# Patient Record
Sex: Female | Born: 1965
Health system: Southern US, Community
[De-identification: ages and names within clinical notes are randomized; demographics above are authoritative.]

## PROBLEM LIST (undated history)

## (undated) DIAGNOSIS — M1990 Unspecified osteoarthritis, unspecified site: Secondary | ICD-10-CM

## (undated) DIAGNOSIS — Z973 Presence of spectacles and contact lenses: Secondary | ICD-10-CM

## (undated) DIAGNOSIS — D649 Anemia, unspecified: Secondary | ICD-10-CM

## (undated) HISTORY — PX: CERVICAL CERCLAGE: SHX1329

---

## 2005-04-28 ENCOUNTER — Emergency Department: Payer: Self-pay | Admitting: Emergency Medicine

## 2005-04-29 ENCOUNTER — Emergency Department: Payer: Self-pay | Admitting: Emergency Medicine

## 2007-05-13 DIAGNOSIS — N912 Amenorrhea, unspecified: Secondary | ICD-10-CM | POA: Insufficient documentation

## 2008-09-11 DIAGNOSIS — Z833 Family history of diabetes mellitus: Secondary | ICD-10-CM | POA: Insufficient documentation

## 2014-11-02 DIAGNOSIS — Z791 Long term (current) use of non-steroidal anti-inflammatories (NSAID): Secondary | ICD-10-CM | POA: Insufficient documentation

## 2014-11-02 DIAGNOSIS — R7989 Other specified abnormal findings of blood chemistry: Secondary | ICD-10-CM | POA: Insufficient documentation

## 2014-11-02 DIAGNOSIS — L659 Nonscarring hair loss, unspecified: Secondary | ICD-10-CM | POA: Insufficient documentation

## 2015-01-14 DIAGNOSIS — M2550 Pain in unspecified joint: Secondary | ICD-10-CM | POA: Insufficient documentation

## 2015-04-09 DIAGNOSIS — N939 Abnormal uterine and vaginal bleeding, unspecified: Secondary | ICD-10-CM | POA: Insufficient documentation

## 2015-05-10 DIAGNOSIS — M7989 Other specified soft tissue disorders: Secondary | ICD-10-CM | POA: Insufficient documentation

## 2015-10-01 ENCOUNTER — Encounter: Payer: Self-pay | Admitting: Emergency Medicine

## 2015-10-01 ENCOUNTER — Ambulatory Visit
Admission: EM | Admit: 2015-10-01 | Discharge: 2015-10-01 | Disposition: A | Discharge status: Home / Self Care | Payer: 59 | Attending: Family Medicine | Admitting: Family Medicine

## 2015-10-01 ENCOUNTER — Ambulatory Visit (INDEPENDENT_AMBULATORY_CARE_PROVIDER_SITE_OTHER): Payer: 59

## 2015-10-01 DIAGNOSIS — R071 Chest pain on breathing: Secondary | ICD-10-CM | POA: Insufficient documentation

## 2015-10-01 DIAGNOSIS — Z79899 Other long term (current) drug therapy: Secondary | ICD-10-CM | POA: Diagnosis not present

## 2015-10-01 DIAGNOSIS — R079 Chest pain, unspecified: Secondary | ICD-10-CM | POA: Diagnosis not present

## 2015-10-01 DIAGNOSIS — R0789 Other chest pain: Secondary | ICD-10-CM

## 2015-10-01 LAB — BASIC METABOLIC PANEL
ANION GAP: 5 (ref 5–15)
BUN: 13 mg/dL (ref 6–20)
CALCIUM: 9.2 mg/dL (ref 8.9–10.3)
CO2: 26 mmol/L (ref 22–32)
Chloride: 107 mmol/L (ref 101–111)
Creatinine, Ser: 0.85 mg/dL (ref 0.44–1.00)
GLUCOSE: 89 mg/dL (ref 65–99)
POTASSIUM: 3.8 mmol/L (ref 3.5–5.1)
Sodium: 138 mmol/L (ref 135–145)

## 2015-10-01 LAB — CK: CK TOTAL: 213 U/L (ref 38–234)

## 2015-10-01 LAB — CBC WITH DIFFERENTIAL/PLATELET
BASOS ABS: 0.1 10*3/uL (ref 0–0.1)
BASOS PCT: 1 %
Eosinophils Absolute: 0.1 10*3/uL (ref 0–0.7)
Eosinophils Relative: 2 %
HEMATOCRIT: 39.8 % (ref 35.0–47.0)
Hemoglobin: 12.9 g/dL (ref 12.0–16.0)
LYMPHS PCT: 35 %
Lymphs Abs: 2.6 10*3/uL (ref 1.0–3.6)
MCH: 28.8 pg (ref 26.0–34.0)
MCHC: 32.5 g/dL (ref 32.0–36.0)
MCV: 88.5 fL (ref 80.0–100.0)
MONO ABS: 0.6 10*3/uL (ref 0.2–0.9)
Monocytes Relative: 8 %
NEUTROS ABS: 4.1 10*3/uL (ref 1.4–6.5)
Neutrophils Relative %: 54 %
PLATELETS: 190 10*3/uL (ref 150–440)
RBC: 4.5 MIL/uL (ref 3.80–5.20)
RDW: 13 % (ref 11.5–14.5)
WBC: 7.5 10*3/uL (ref 3.6–11.0)

## 2015-10-01 LAB — CKMB (ARMC ONLY): CK, MB: 2.5 ng/mL (ref 0.5–5.0)

## 2015-10-01 LAB — FIBRIN DERIVATIVES D-DIMER (ARMC ONLY): FIBRIN DERIVATIVES D-DIMER (ARMC): 276 (ref 0–499)

## 2015-10-01 LAB — TROPONIN I: Troponin I: <0.03 ng/mL (ref <0.03)

## 2015-10-01 MED ORDER — KETOROLAC TROMETHAMINE 60 MG/2ML IM SOLN
60.0000 mg | Freq: Once | INTRAMUSCULAR | Status: AC
  Administered 2015-10-01: 60 mg via INTRAMUSCULAR

## 2015-10-01 MED ORDER — MELOXICAM 15 MG PO TABS: 15.0000 mg | ORAL_TABLET | Freq: Every day | ORAL | 0 refills | Status: DC

## 2015-10-01 NOTE — ED Triage Notes (Signed)
Patient states she developed chest pain this morning about 8. Pain is radiating from center of chest to left shoulder and back

## 2015-10-01 NOTE — ED Provider Notes (Signed)
[facility]    CSN: [id number] Arrival date & time: 10/01/15  1232  First Provider Contact:  First MD Initiated Contact with Patient 10/01/15 1246        History   Chief Complaint Chief Complaint  Patient presents with  . Chest Pain    HPI Jenna [name] is a 50 y.o. female.   Patient is here because of chest pain. She states she had no pain when she woke up and got up to go to work and as she was leaving about 7:45 she started having pain which took a deep breath. The pain was centered in the left upper chest. She was at work the pain continues and cause enough discomfort that she left. 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 She was a little bit concerned about going to the ED there since that she puts it and we'll see her that she came up to 11 to be seen and evaluated.  Past history essentially negative other than cervical cerclage during her pregnancies. She does not smoke she is allergic to sulfur. No significant family medical history pertinent to today's visit. She denies any history of hyperlipidemia or hypertension   The history is provided by the patient. No language interpreter was used.  Chest Pain  Pain location:  Substernal area and L chest Pain radiates to:  Upper back Pain severity:  Moderate Onset quality:  Sudden Duration:  6 hours Timing:  Constant Progression:  Unchanged Chronicity:  New Context: breathing   Relieved by:  Nothing Worsened by:  Nothing Ineffective treatments:  None tried Associated symptoms: shortness of breath   Risk factors: no birth control, no coronary artery disease, no diabetes mellitus, no high cholesterol, no hypertension, not female, not obese, not pregnant, no prior DVT/PE and no smoking     History reviewed. No pertinent past medical history.  There are no active problems to display for this patient.   Past Surgical History:  Procedure Laterality Date  . CERVICAL CERCLAGE      OB History    No data  available       Home Medications    Prior to Admission medications   Medication Sig Start Date End Date Taking? Authorizing Provider  meloxicam (MOBIC) 15 MG tablet Take 1 tablet (15 mg total) by mouth daily. 10/01/15   Frederich Cha, MD    Family History Family History  Problem Relation Age of Onset  . Diabetes Other     Social History Social History  Substance Use Topics  . Smoking status: Never Smoker  . Smokeless tobacco: Never Used  . Alcohol use Yes     Allergies   Sulfa antibiotics   Review of Systems Review of Systems  Respiratory: Positive for shortness of breath.   Cardiovascular: Positive for chest pain.     Physical Exam Triage Vital Signs ED Triage Vitals  Enc Vitals Group     BP 10/01/15 1240 (!) 163/83     Pulse Rate 10/01/15 1240 75     Resp 10/01/15 1240 20     Temp 10/01/15 1240 98.1 F (36.7 C)     Temp Source 10/01/15 1240 Oral     SpO2 10/01/15 1240 100 %     Weight 10/01/15 1240 175 lb (79.4 kg)     Height 10/01/15 1240 5\' 8"  (1.727 m)     Head Circumference --      Peak Flow --      Pain Score 10/01/15  1245 7     Pain Loc --      Pain Edu? --      Excl. in [place]? --    No data found.   Updated Vital Signs BP (!) 143/82 (BP Location: Right Arm)   Pulse 72   Temp 97.5 F (36.4 C) (Tympanic)   Resp 16   Ht 5\' 8"  (1.727 m)   Wt 175 lb (79.4 kg)   LMP  (LMP Unknown) Comment: IUD, denies preg  SpO2 100%   BMI 26.61 kg/m   Visual Acuity Right Eye Distance:   Left Eye Distance:   Bilateral Distance:    Right Eye Near:   Left Eye Near:    Bilateral Near:     Physical Exam  Constitutional: She is oriented to person, place, and time. She appears well-developed and well-nourished. No distress.  HENT:  Head: Normocephalic and atraumatic.  Eyes: EOM are normal. Pupils are equal, round, and reactive to light.  Neck: Normal range of motion. Neck supple.  Cardiovascular: Normal rate, regular rhythm and normal heart sounds.     Pulmonary/Chest: Effort normal and breath sounds normal. She exhibits tenderness. She exhibits no laceration and no swelling. Right breast exhibits no tenderness. Left breast exhibits no tenderness.    Abdominal: Soft.  Musculoskeletal: Normal range of motion.  Neurological: She is alert and oriented to person, place, and time.  Skin: Skin is warm.  Psychiatric: She has a normal mood and affect.  Vitals reviewed.    UC Treatments / Results  Labs (all labs ordered are listed, but only abnormal results are displayed) Labs Reviewed  CBC WITH DIFFERENTIAL/PLATELET  BASIC METABOLIC PANEL  TROPONIN I  CK  FIBRIN DERIVATIVES D-DIMER ([facility] ONLY)  CKMB([facility] ONLY)    EKG  EKG Interpretation None      ED ECG REPORT I, Savan Ruta H, the attending physician, personally viewed and interpreted this ECG.   Date: 10/01/2015  EKG Time: 14:21:33  Rate: 73  Rhythm: normal EKG, normal sinus rhythm  Axis: 44  Intervals:none  ST&T Change: none Radiology Dg Chest 1 View  Result Date: 10/01/2015 CLINICAL DATA:  Chest pain today with episodes of shortness of breath EXAM: CHEST 1 VIEW COMPARISON:  None. FINDINGS: No active infiltrate or effusion is seen. Mediastinal and hilar contours are unremarkable. The heart is within normal limits in size. Calcification near the expected insertion of the right rotator cuff may indicate chronic calcific right rotator cuff tendinitis. IMPRESSION: 1. No active lung disease. 2. Question chronic calcific right rotator cuff tendinitis. Electronically Signed   By: Ivar Drape M.D.   On: 10/01/2015 13:58    Procedures Procedures (including critical care time)  Medications Ordered in UC Medications  ketorolac (TORADOL) injection 60 mg (60 mg Intramuscular Given 10/01/15 1324)   Results for orders placed or performed during the hospital encounter of 10/01/15  CBC with Differential  Result Value Ref Range   WBC 7.5 3.6 - 11.0 K/uL   RBC 4.50 3.80 - 5.20  MIL/uL   Hemoglobin 12.9 12.0 - 16.0 g/dL   HCT 39.8 35.0 - 47.0 %   MCV 88.5 80.0 - 100.0 fL   MCH 28.8 26.0 - 34.0 pg   MCHC 32.5 32.0 - 36.0 g/dL   RDW 13.0 11.5 - 14.5 %   Platelets 190 150 - 440 K/uL   Neutrophils Relative % 54 %   Neutro Abs 4.1 1.4 - 6.5 K/uL   Lymphocytes Relative 35 %   Lymphs  Abs 2.6 1.0 - 3.6 K/uL   Monocytes Relative 8 %   Monocytes Absolute 0.6 0.2 - 0.9 K/uL   Eosinophils Relative 2 %   Eosinophils Absolute 0.1 0 - 0.7 K/uL   Basophils Relative 1 %   Basophils Absolute 0.1 0 - 0.1 K/uL  Basic metabolic panel  Result Value Ref Range   Sodium 138 135 - 145 mmol/L   Potassium 3.8 3.5 - 5.1 mmol/L   Chloride 107 101 - 111 mmol/L   CO2 26 22 - 32 mmol/L   Glucose, Bld 89 65 - 99 mg/dL   BUN 13 6 - 20 mg/dL   Creatinine, Ser 0.85 0.44 - 1.00 mg/dL   Calcium 9.2 8.9 - 10.3 mg/dL   GFR calc non Af Amer >60 >60 mL/min   GFR calc Af Amer >60 >60 mL/min   Anion gap 5 5 - 15  Troponin I  Result Value Ref Range   Troponin I <0.03 <0.03 ng/mL  CK  Result Value Ref Range   Total CK 213 38 - 234 U/L  Fibrin derivatives D-Dimer ([facility] only)  Result Value Ref Range   Fibrin derivatives D-dimer ([facility]) 276 0 - 499  CKMB([facility] only)  Result Value Ref Range   CK, MB 2.5 0.5 - 5.0 ng/mL    Initial Impression / Assessment and Plan / UC Course  I have reviewed the triage vital signs and the nursing notes.  Pertinent labs & imaging results that were available during my care of the patient were reviewed by me and considered in my medical decision making (see chart for details).  Clinical Course  Patient improved with the injection of Toradol 60 no grams IM EKG was normal chest x-ray is normal labs drawn normal and d-dimer is negative. Will allow patient to go home work note for today and tomorrow placed on Mobic 15 mg 1 tablet day follow up with PCP if not better in the next 2448 hrs. Final Clinical Impressions(s) / UC Diagnoses   Final diagnoses:  Chest wall  pain  Chest pain on breathing    New Prescriptions New Prescriptions   MELOXICAM (MOBIC) 15 MG TABLET    Take 1 tablet (15 mg total) by mouth daily.     Frederich Cha, MD 10/01/15 [phone]

## 2015-10-29 DIAGNOSIS — E079 Disorder of thyroid, unspecified: Secondary | ICD-10-CM | POA: Diagnosis not present

## 2015-10-29 DIAGNOSIS — Z8719 Personal history of other diseases of the digestive system: Secondary | ICD-10-CM | POA: Insufficient documentation

## 2015-10-29 DIAGNOSIS — Z6829 Body mass index (BMI) 29.0-29.9, adult: Secondary | ICD-10-CM | POA: Diagnosis not present

## 2015-10-29 DIAGNOSIS — Z113 Encounter for screening for infections with a predominantly sexual mode of transmission: Secondary | ICD-10-CM | POA: Diagnosis not present

## 2015-10-29 DIAGNOSIS — Z0000 Encounter for general adult medical examination without abnormal findings: Secondary | ICD-10-CM | POA: Diagnosis not present

## 2015-11-22 DIAGNOSIS — Z0000 Encounter for general adult medical examination without abnormal findings: Secondary | ICD-10-CM | POA: Diagnosis not present

## 2015-11-22 DIAGNOSIS — Z113 Encounter for screening for infections with a predominantly sexual mode of transmission: Secondary | ICD-10-CM | POA: Diagnosis not present

## 2015-11-22 DIAGNOSIS — Z8719 Personal history of other diseases of the digestive system: Secondary | ICD-10-CM | POA: Diagnosis not present

## 2015-11-22 DIAGNOSIS — E079 Disorder of thyroid, unspecified: Secondary | ICD-10-CM | POA: Diagnosis not present

## 2015-12-17 DIAGNOSIS — B009 Herpesviral infection, unspecified: Secondary | ICD-10-CM | POA: Diagnosis not present

## 2015-12-17 DIAGNOSIS — Z23 Encounter for immunization: Secondary | ICD-10-CM | POA: Diagnosis not present

## 2015-12-17 DIAGNOSIS — Z6828 Body mass index (BMI) 28.0-28.9, adult: Secondary | ICD-10-CM | POA: Diagnosis not present

## 2015-12-20 DIAGNOSIS — Z1231 Encounter for screening mammogram for malignant neoplasm of breast: Secondary | ICD-10-CM | POA: Diagnosis not present

## 2016-03-09 DIAGNOSIS — Z6829 Body mass index (BMI) 29.0-29.9, adult: Secondary | ICD-10-CM | POA: Diagnosis not present

## 2016-03-09 DIAGNOSIS — M62830 Muscle spasm of back: Secondary | ICD-10-CM | POA: Diagnosis not present

## 2016-03-09 DIAGNOSIS — G8929 Other chronic pain: Secondary | ICD-10-CM | POA: Diagnosis not present

## 2016-03-09 DIAGNOSIS — M25511 Pain in right shoulder: Secondary | ICD-10-CM | POA: Diagnosis not present

## 2016-03-17 ENCOUNTER — Encounter: Payer: Self-pay | Admitting: Physical Therapy

## 2016-03-17 ENCOUNTER — Ambulatory Visit: Payer: 59 | Attending: Family Medicine | Admitting: Physical Therapy

## 2016-03-17 DIAGNOSIS — M25511 Pain in right shoulder: Secondary | ICD-10-CM | POA: Diagnosis not present

## 2016-03-17 DIAGNOSIS — R293 Abnormal posture: Secondary | ICD-10-CM | POA: Diagnosis not present

## 2016-03-17 DIAGNOSIS — M6281 Muscle weakness (generalized): Secondary | ICD-10-CM | POA: Insufficient documentation

## 2016-03-17 DIAGNOSIS — G8929 Other chronic pain: Secondary | ICD-10-CM | POA: Diagnosis not present

## 2016-03-17 NOTE — Therapy (Signed)
[place] MAIN [facility] SERVICES [address] [place], [place], [address] Phone: [phone]   Fax:  [phone]  Physical Therapy Evaluation  Patient Details  Name: Jenna [name] MRN: [id number] Date of Birth: 1966/01/29 Referring Provider: Blenda Nicely MD  Encounter Date: 03/17/2016      PT End of Session - 03/17/16 0852    Visit Number 1   Number of Visits 13   Date for PT Re-Evaluation 04/28/16   PT Start Time 0812   PT Stop Time 0855   PT Time Calculation (min) 43 min   Activity Tolerance Patient limited by pain   Behavior During Therapy [facility] for tasks assessed/performed      History reviewed. No pertinent past medical history.  Past Surgical History:  Procedure Laterality Date  . CERVICAL CERCLAGE      There were no vitals filed for this visit.       Subjective Assessment - 03/17/16 0814    Subjective 51 yo Female reports increased right shoulder pain since August 2017. She reports that she was working out with weights and the next day started feeling pain; She reports feeling pain along right side of chest which resolved shortly after. She is having continued right shoulder pain which is worse when sleeping; She reports sleeping on left shoulder and will wake up with increased right shoulder pain; Patient went to see MD who prescribed prednisone dose pack; She reports feeling increased posterior right shoulder pain along scapula that radiates to top of right shoulder; She reports increased numbness/tingling with sleeping; She reports also having numbness on left shoulder from prolonged sleeping on left side;    Pertinent History personal factors affecting rehab: chronic condition; impaired posture, continued work and caring for kids;    How long can you sit comfortably? 20-30 min;    How long can you stand comfortably? NA   How long can you walk comfortably? NA   Diagnostic tests none recent;    Patient Stated Goals relieve  pain so that she can get back exercising;    Currently in Pain? Yes   Pain Score 5    Pain Location Shoulder   Pain Orientation Right   Pain Descriptors / Indicators Aching;Dull;Burning   Pain Type Chronic pain   Pain Onset More than a month ago   Pain Frequency Constant   Aggravating Factors  lying down;    Pain Relieving Factors heat does help;    Effect of Pain on Daily Activities decreased; still working full-time/full-duty;             [facility] PT Assessment - 03/17/16 0001      Assessment   Medical Diagnosis Right shoulder pain   Referring Provider Blenda Nicely MD   Onset Date/Surgical Date 10/01/15   Hand Dominance Right   Next MD Visit 2 months   Prior Therapy denies any PT for this condition;      Precautions   Precautions None     Restrictions   Weight Bearing Restrictions No     Balance Screen   Has the patient fallen in the past 6 months No   Has the patient had a decrease in activity level because of a fear of falling?  No   Is the patient reluctant to leave their home because of a fear of falling?  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;    Leisure ride motorcycle; working out;      Charity fundraiser Status Within Functional Limits for tasks assessed     Observation/Other Assessments   Observations pt sits with slightly slumped posture; will drop right shoulder with increased pain; patient very pain focused and reports pain along right scapulae radiating to right side of neck and shoulder;    Quick DASH  59% (the higher the score the greater the disability)     Sensation   Light Touch Appears Intact    Proprioception Appears Intact     Coordination   Gross Motor Movements are Fluid and Coordinated Yes   Fine Motor Movements are Fluid and Coordinated Yes     Posture/Postural Control   Posture Comments sits with mild slumped posture with mild to moderate forward rounded shoulders; demontrates increased slumped posture and forward lean with increased pian;       AROM   Overall AROM Comments BLE is WFL   Right Shoulder Extension 60 Degrees   Right Shoulder Flexion 70 Degrees   Right Shoulder ABduction 60 Degrees   Right Shoulder Internal Rotation 60 Degrees   Right Shoulder External Rotation 0 Degrees   Left Shoulder Extension 60 Degrees   Left Shoulder Flexion 160 Degrees   Left Shoulder ABduction 80 Degrees  has pain along top of shoulder;   Left Shoulder Internal Rotation 60 Degrees   Left Shoulder External Rotation 60 Degrees     Strength   Overall Strength Comments RUE shoulder grossly 3-/5, LUE shoulder: 4-/5, BUE: elbow/wrist and hand 4/5; right rhomboids: 3-/5     Palpation   Palpation comment has moderate tenderness along right scapulae; increased tightness noted along right levator scapular muscle;      Neer Impingement test    Findings Positive   Side Right     Hawkins-Kennedy test   Findings Positive   Side Right     Empty Can test   Findings Positive   Side Right     Full Can test   Findings Positive     Drop Arm test   Findings Negative   Side Right     Transfers   Comments able to transfer sit<>stand independently;      Ambulation/Gait   Gait Comments ambulates with normal gait pattern; only postural abnormality noted; see above;          TREATMENT: Educated patient in posterior shoulder rolls and scapular retraction to improve postural support and flexibility; Will advance HEP next visit; (3 min)                   PT Education - 03/17/16 0851    Education provided Yes   Education Details posture, recommendations, plan of  care;    Person(s) Educated Patient   Methods Explanation;Verbal cues   Comprehension Verbalized understanding;Returned demonstration;Verbal cues required             PT Long Term Goals - 03/17/16 0908      PT LONG TERM GOAL #1   Title Patient will be independent in home exercise program to improve strength/mobility for better functional independence with ADLs.   Time 6   Period Weeks   Status New     PT  LONG TERM GOAL #2   Title Patient will report a worst pain of 3/10 on VAS in  right shoulder           to improve tolerance with ADLs and reduced symptoms with activities.    Time 6   Period Weeks   Status New     PT LONG TERM GOAL #3   Title Patient will improve shoulder AROM to > 140 degrees of flexion, scaption, and abduction for improved ability to perform overhead activities   Time 6   Period Weeks   Status New     PT LONG TERM GOAL #4   Title Patient will decrease Quick DASH score by > 8 points demonstrating reduced self-reported upper extremity disability.   Time 6   Period Weeks   Status New               Plan - 03/17/16 0901    Clinical Impression Statement 51 yo Female reports increased right shoulder pain since August 2017 after working out. She reports since then she has been resting her shoulder and trying to compensate with LUE movement. Patient reports sleeping more on left side and since she has done that she has started feeling increased left shoulder pain/numbness at night. Patient reports frequent sleep disturbances. Patient exhibits decreased UE AROM in BUE feeling increased pain along top of shoulder. Patient also reports increased discomfort on right scapulae particularly along medial and superior border. Patient demonstrates weakness in RUE. She tested positive for impingement with positive hawkins-kennedy and Neers test. Patient exhibits minimal movement in RUE glenohumeral joint with joint mobs. She would benefit from skilled PT intervention to  improve shoulder movement/flexibility and reduce pain with ADLs.     Rehab Potential Fair   Clinical Impairments Affecting Rehab Potential positive: young in age, motivated; negative: chronic condition, impaired posture; Patient's clinical presentation is evolving as she has started experiencing worsening of symptoms with numbness down RUE and progressing to pain and numbness down LUE shoulder;    PT Frequency 2x / week   PT Duration 6 weeks   PT Treatment/Interventions ADLs/Self Care Home Management;Cryotherapy;Electrical Stimulation;Iontophoresis 4mg /ml Dexamethasone;Moist Heat;Ultrasound;Therapeutic exercise;Therapeutic activities;Patient/family education;Manual techniques;Taping;Energy conservation;Dry needling;Passive range of motion   PT Next Visit Plan initiate HEP   PT Home Exercise Plan will address next visit; did instruct in posterior shoulder rolls and scapular retraction;    Consulted and Agree with Plan of Care Patient      Patient will benefit from skilled therapeutic intervention in order to improve the following deficits and impairments:  Hypomobility, Pain, Impaired UE functional use, Decreased strength, Improper body mechanics, Postural dysfunction, Impaired flexibility  Visit Diagnosis: Chronic right shoulder pain - Plan: PT plan of care cert/re-cert  Muscle weakness (generalized) - Plan: PT plan of care cert/re-cert  Abnormal posture - Plan: PT plan of care cert/re-cert     Problem List There are no active problems to display for this patient.   Heba Ige PT, DPT 03/17/2016, 9:12 AM  [place] MAIN [facility] SERVICES [address] [place], [place], [address] Phone: [phone]   Fax:  [phone]  Name: Jenna [name] MRN: [id number] Date of Birth: [birth year]

## 2016-03-19 ENCOUNTER — Encounter: Payer: 59 | Admitting: Physical Therapy

## 2016-03-19 ENCOUNTER — Ambulatory Visit: Payer: 59 | Admitting: Physical Therapy

## 2016-03-24 ENCOUNTER — Ambulatory Visit: Payer: 59 | Admitting: Physical Therapy

## 2016-03-24 ENCOUNTER — Encounter: Payer: Self-pay | Admitting: Physical Therapy

## 2016-03-24 DIAGNOSIS — M6281 Muscle weakness (generalized): Secondary | ICD-10-CM | POA: Diagnosis not present

## 2016-03-24 DIAGNOSIS — R293 Abnormal posture: Secondary | ICD-10-CM | POA: Diagnosis not present

## 2016-03-24 DIAGNOSIS — G8929 Other chronic pain: Secondary | ICD-10-CM

## 2016-03-24 DIAGNOSIS — M25511 Pain in right shoulder: Principal | ICD-10-CM

## 2016-03-24 NOTE — Therapy (Signed)
[place] MAIN [facility] SERVICES [address] [place], [place], [address] Phone: [phone]   Fax:  [phone]  Physical Therapy Treatment  Patient Details  Name: Jenna [name] MRN: [id number] Date of Birth: Aug 12, 1965 Referring Provider: Blenda Nicely MD  Encounter Date: 03/24/2016      PT End of Session - 03/24/16 0810    Visit Number 2   Number of Visits 13   Date for PT Re-Evaluation 04/28/16   PT Start Time 0805   PT Stop Time 0845   PT Time Calculation (min) 40 min   Activity Tolerance Patient limited by pain   Behavior During Therapy [facility] for tasks assessed/performed      History reviewed. No pertinent past medical history.  Past Surgical History:  Procedure Laterality Date  . CERVICAL CERCLAGE      There were no vitals filed for this visit.      Subjective Assessment - 03/24/16 0808    Subjective patient reports feeling increased pain since initial eval through last week; She reports wearing pain patch and had a massage last Friday which helped; Patient reports using her muscle relaxors at night and ibuprofen; She reports that her pain was better Sunday; Patient reports that she did try some of the exercises, but wasn't consistent because of pain;    Pertinent History personal factors affecting rehab: chronic condition; impaired posture, continued work and caring for kids;    How long can you sit comfortably? 20-30 min;    How long can you stand comfortably? NA   How long can you walk comfortably? NA   Diagnostic tests none recent;    Patient Stated Goals relieve pain so that she can get back exercising;    Currently in Pain? Yes   Pain Score 4    Pain Location Shoulder   Pain Orientation Right   Pain Descriptors / Indicators Aching;Dull   Pain Type Chronic pain   Pain Onset More than a month ago            [facility] PT Assessment - 03/24/16 0001      AROM   Right Shoulder Flexion 105 Degrees   Right Shoulder  ABduction 72 Degrees   Left Shoulder Flexion 130 Degrees   Left Shoulder ABduction 75 Degrees    TREATMENT: Warm up on UBE BUE level 1 forward/backward x4 min (unbilled);   Instructed patient in UE stretches to reduce tightness: Doorway stretch 20 sec hold x2; Posterior shoulder rolls x10 Chin tuck 5 sec hold x10;  Levator scapulae stretch 10 sec hold x2 bilaterally;  Patient required min-moderate verbal/tactile cues for correct exercise technique including cues for positioning to improve stretch and reduce pain;    Scapular retraction 5 sec hold x10 with min VCs to avoid shoulder elevation; Standing with hand on table, pressing down on table and walking backwards into shoulder flexion to facilitate inferior glide of glenohumeral joint x10 bilaterally;  PT advanced HEP- see patient instructions;  Patient in supine: PT performed grade I-II inferior glenohumeral joint mobs 10 sec bouts x4 sets bilaterally; PROM of UE shoulder in all directions x4 each, bilaterally; Gentle glenohumeral joint distraction x2-3 min bilaterally; Patient had increased difficulty relaxing on RUE with joint mobs and PROM; She reports slight increase in discomfort;  Patient sidelying: Educated patient in sidelying posture including to use extra pillows to support UE positioning to reduce forward rounded shoulder of RUE;  RUE shoulder ER AROM x10 reps;  See above ROM; Patient  demonstrates improve RUE shoulder ROM but is still limited with pain;                       PT Education - 03/24/16 0810    Education provided Yes   Education Details posture, strengthening/ROM;HEP   Person(s) Educated Patient   Methods Explanation;Verbal cues   Comprehension Verbalized understanding;Returned demonstration;Verbal cues required             PT Long Term Goals - 03/17/16 0908      PT LONG TERM GOAL #1   Title Patient will be independent in home exercise program to improve strength/mobility  for better functional independence with ADLs.   Time 6   Period Weeks   Status New     PT LONG TERM GOAL #2   Title Patient will report a worst pain of 3/10 on VAS in  right shoulder           to improve tolerance with ADLs and reduced symptoms with activities.    Time 6   Period Weeks   Status New     PT LONG TERM GOAL #3   Title Patient will improve shoulder AROM to > 140 degrees of flexion, scaption, and abduction for improved ability to perform overhead activities   Time 6   Period Weeks   Status New     PT LONG TERM GOAL #4   Title Patient will decrease Quick DASH score by > 8 points demonstrating reduced self-reported upper extremity disability.   Time 6   Period Weeks   Status New               Plan - 03/24/16 0850    Clinical Impression Statement Patient continues to have significant pain in BUE shoulders with decreased ROM and limited tolerance with exercise/stretches; Advanced HEP with instruction in scapular retraction as well as cervical stretches; patient required mod VCs to improve posture and positioning for optimum stretch/strengthening; She would benefit from additional skilled PT intervention to improve UE ROM/strength and reduce pain with ADLs;    Rehab Potential Fair   Clinical Impairments Affecting Rehab Potential positive: young in age, motivated; negative: chronic condition, impaired posture; Patient's clinical presentation is evolving as she has started experiencing worsening of symptoms with numbness down RUE and progressing to pain and numbness down LUE shoulder;    PT Frequency 2x / week   PT Duration 6 weeks   PT Treatment/Interventions ADLs/Self Care Home Management;Cryotherapy;Electrical Stimulation;Iontophoresis 4mg /ml Dexamethasone;Moist Heat;Ultrasound;Therapeutic exercise;Therapeutic activities;Patient/family education;Manual techniques;Taping;Energy conservation;Dry needling;Passive range of motion   PT Next Visit Plan initiate HEP   PT Home  Exercise Plan will address next visit; did instruct in posterior shoulder rolls and scapular retraction;    Consulted and Agree with Plan of Care Patient      Patient will benefit from skilled therapeutic intervention in order to improve the following deficits and impairments:  Hypomobility, Pain, Impaired UE functional use, Decreased strength, Improper body mechanics, Postural dysfunction, Impaired flexibility  Visit Diagnosis: Chronic right shoulder pain  Muscle weakness (generalized)  Abnormal posture     Problem List There are no active problems to display for this patient.   Damaree Sargent PT, DPT 03/24/2016, 8:54 AM  [place] MAIN [facility] SERVICES [address] [place], [place], [address] Phone: [phone]   Fax:  [phone]  Name: Jenna [name] MRN: [id number] Date of Birth: 1965-04-04

## 2016-03-24 NOTE — Patient Instructions (Addendum)
CHEST: Doorway, Bilateral - Standing    Standing in doorway, place hands on wall with elbows bent at [place], step forward and  Lean forward. Hold _20__ seconds. __2_ reps per set,2 ___ sets per day, __5_ days per week  Copyright  VHI. All rights reserved.  Axial Extension (Chin Tuck)    Gently pull chin in while lengthening back of neck. Hold _5___ seconds while counting out loud. Repeat _10___ times. Do _2-3___ sessions per day.  http://gt2.exer.us/646   Copyright  VHI. All rights reserved.  Levator Scapula Stretch, Sitting    Sit, one hand tucked under hip on side to be stretched, other hand over top of head. Turn head toward other side and look down (like looking under arm pit). Use hand on head to gently stretch neck in that position. Hold 10___ seconds. Repeat __2-3_ times per session. Do _2-3__ sessions per day.  Copyright  VHI. All rights reserved.   Flexion (Passive)   Standing, push hand down into table, and step backwards stretching shoulder as far as is comfortable;  Hold _5___ seconds. Repeat _10___ times. Do ___2_ sessions per day.  Copyright  VHI. All rights reserved.  Scapular Retraction (Standing)    With arms at sides, pinch shoulder blades together. Repeat __10__ times per set. Do __2__ sets per session. Do _2___ sessions per day.  http://orth.exer.us/945   Copyright  VHI. All rights reserved.  External Rotation (Eccentric), Active - Side-Lying    Lie on side, affected arm on top, elbow bent to 90, towel under elbow. Quickly lift forearm of affected arm. Slowly lower affected arm for 3-5 seconds. _5__ reps per set, __2_ sets per day, _5__ days per week.   http://ecce.exer.us/189   Copyright  VHI. All rights reserved.

## 2016-03-26 ENCOUNTER — Encounter: Payer: Self-pay | Admitting: Physical Therapy

## 2016-03-26 ENCOUNTER — Ambulatory Visit: Payer: 59 | Admitting: Physical Therapy

## 2016-03-26 DIAGNOSIS — R293 Abnormal posture: Secondary | ICD-10-CM | POA: Diagnosis not present

## 2016-03-26 DIAGNOSIS — M6281 Muscle weakness (generalized): Secondary | ICD-10-CM

## 2016-03-26 DIAGNOSIS — M25511 Pain in right shoulder: Secondary | ICD-10-CM | POA: Diagnosis not present

## 2016-03-26 DIAGNOSIS — G8929 Other chronic pain: Secondary | ICD-10-CM

## 2016-03-26 NOTE — Patient Instructions (Addendum)
Strengthening: Resisted External Rotation    Hold tubing in right hand, elbow at side and forearm across body. Rotate forearm out. Keep elbow at side;  Repeat 10____ times per set. Do __1__ sets per session. Do _2-3___ sessions per day.  http://orth.exer.us/829   Copyright  VHI. All rights reserved.

## 2016-03-26 NOTE — Therapy (Signed)
[place] MAIN [facility] SERVICES [address] [place], [place], [address] Phone: [phone]   Fax:  [phone]  Physical Therapy Treatment  Patient Details  Name: Jenna [name] MRN: [id number] Date of Birth: Sep 29, 1965 Referring Provider: Blenda Nicely MD  Encounter Date: 03/26/2016      PT End of Session - 03/26/16 1615    Visit Number 3   Number of Visits 13   Date for PT Re-Evaluation 04/28/16   PT Start Time 1602   PT Stop Time 1645   PT Time Calculation (min) 43 min   Activity Tolerance Patient limited by pain   Behavior During Therapy [facility] for tasks assessed/performed      History reviewed. No pertinent past medical history.  Past Surgical History:  Procedure Laterality Date  . CERVICAL CERCLAGE      There were no vitals filed for this visit.      Subjective Assessment - 03/26/16 1613    Subjective Patient reports feeling better yesterday and has noticed that she has been able to do more exercise; She does report increased pain in the right shoulder today and discomfort along neck; Patient reports compliance with HEP; she reports continued difficulty with sleep disturbances;    Pertinent History personal factors affecting rehab: chronic condition; impaired posture, continued work and caring for kids;    How long can you sit comfortably? 20-30 min;    How long can you stand comfortably? NA   How long can you walk comfortably? NA   Diagnostic tests none recent;    Patient Stated Goals relieve pain so that she can get back exercising;    Currently in Pain? Yes   Pain Score 3    Pain Location Shoulder   Pain Orientation Right   Pain Descriptors / Indicators Aching;Sore   Pain Type Chronic pain   Pain Onset More than a month ago        TREATMENT: Patient prone: PT applied moist heat to upper back x5 min (Unbilled);  PT performed grade I-II PA mobs T6, T5, T4, T3, T2, T1 10 sec bouts x2 each; Patient  reports increased discomfort along T3, T2; She denies any pain in right shoulder during manual therapy; Patient supine:  PT performed suboccipital release 20 sec hold x4 reps; Gentle cervical distraction 20 sec hold x2 with no change in symptoms; Passive levator scapulae stretch on right side, 20 sec hold x3 reps;  Patient in seated: Re-educated in HEP: Patient reports increased discomfort in scapular retraction; Patient instructed in scapular depression on RUE x5 reps x2 sets;  Patient in standing: Yellow tband RUE shoulder ER x10 reps with cues to increase erect posture for better strengthening;  Patient responded well to cues; She reports no significant increase in pain with exercise; Advanced HEP with shoulder ER;    I                          PT Education - 03/26/16 1614    Education provided Yes   Education Details posture, strengthening/ROM; HEP   Person(s) Educated Patient   Methods Explanation;Verbal cues   Comprehension Verbalized understanding;Returned demonstration;Verbal cues required             PT Long Term Goals - 03/17/16 0908      PT LONG TERM GOAL #1   Title Patient will be independent in home exercise program to improve strength/mobility for  better functional independence with ADLs.   Time 6   Period Weeks   Status New     PT LONG TERM GOAL #2   Title Patient will report a worst pain of 3/10 on VAS in  right shoulder           to improve tolerance with ADLs and reduced symptoms with activities.    Time 6   Period Weeks   Status New     PT LONG TERM GOAL #3   Title Patient will improve shoulder AROM to > 140 degrees of flexion, scaption, and abduction for improved ability to perform overhead activities   Time 6   Period Weeks   Status New     PT LONG TERM GOAL #4   Title Patient will decrease Quick DASH score by > 8 points demonstrating reduced self-reported upper extremity disability.   Time 6   Period Weeks   Status  New               Plan - 03/26/16 1709    Clinical Impression Statement Pt tolerated session well with less shoulder pain during manual therapy; Patient continues to have tightness and discomfort along T1-T3; PT performed manual therapy to reduce discomfort and improve shoulder ROM; Patient educated in shoulder depression exercise for less upper trap discomfort. Patient would benefit from additional skilled PT intervention to improve strength, ROM and reduce pain with ADLs;    Rehab Potential Fair   Clinical Impairments Affecting Rehab Potential positive: young in age, motivated; negative: chronic condition, impaired posture; Patient's clinical presentation is evolving as she has started experiencing worsening of symptoms with numbness down RUE and progressing to pain and numbness down LUE shoulder;    PT Frequency 2x / week   PT Duration 6 weeks   PT Treatment/Interventions ADLs/Self Care Home Management;Cryotherapy;Electrical Stimulation;Iontophoresis 4mg /ml Dexamethasone;Moist Heat;Ultrasound;Therapeutic exercise;Therapeutic activities;Patient/family education;Manual techniques;Taping;Energy conservation;Dry needling;Passive range of motion   PT Next Visit Plan work on shoulder stabilization exercise;    PT Home Exercise Plan continue as given;    Consulted and Agree with Plan of Care Patient      Patient will benefit from skilled therapeutic intervention in order to improve the following deficits and impairments:  Hypomobility, Pain, Impaired UE functional use, Decreased strength, Improper body mechanics, Postural dysfunction, Impaired flexibility  Visit Diagnosis: Chronic right shoulder pain  Muscle weakness (generalized)  Abnormal posture     Problem List There are no active problems to display for this patient.   Braylon Grenda PT, DPT 03/26/2016, 5:55 PM  [place] MAIN [facility] SERVICES [address] [place], [place],  [address] Phone: [phone]   Fax:  [phone]  Name: Jenna [name] MRN: [id number] Date of Birth: [birth year]

## 2016-03-31 ENCOUNTER — Encounter: Payer: Self-pay | Admitting: Physical Therapy

## 2016-03-31 ENCOUNTER — Ambulatory Visit: Payer: 59 | Admitting: Physical Therapy

## 2016-03-31 DIAGNOSIS — M25511 Pain in right shoulder: Secondary | ICD-10-CM | POA: Diagnosis not present

## 2016-03-31 DIAGNOSIS — M6281 Muscle weakness (generalized): Secondary | ICD-10-CM

## 2016-03-31 DIAGNOSIS — R293 Abnormal posture: Secondary | ICD-10-CM | POA: Diagnosis not present

## 2016-03-31 DIAGNOSIS — G8929 Other chronic pain: Secondary | ICD-10-CM | POA: Diagnosis not present

## 2016-03-31 NOTE — Therapy (Signed)
[place] MAIN [facility] SERVICES [address] [place], [place], [address] Phone: [phone]   Fax:  [phone]  Physical Therapy Treatment  Patient Details  Name: Jenna [name] MRN: [id number] Date of Birth: [birth year] Referring Provider: Blenda Nicely MD  Encounter Date: 03/31/2016      PT End of Session - 03/31/16 1625    Visit Number 4   Number of Visits 13   Date for PT Re-Evaluation 04/28/16   PT Start Time [id number]   PT Stop Time 1700   PT Time Calculation (min) 45 min   Activity Tolerance Patient limited by pain   Behavior During Therapy [facility] for tasks assessed/performed      History reviewed. No pertinent past medical history.  Past Surgical History:  Procedure Laterality Date  . CERVICAL CERCLAGE      There were no vitals filed for this visit.      Subjective Assessment - 03/31/16 1623    Subjective Patient reports feeling better overall. She reports being more consistent with exercise and feeling like her shoulder is doing better; She reports, "I still have the dull ache but its less present and also I've noticed that I'm using the heating pad more on my neck and less on my shoulder"    Pertinent History personal factors affecting rehab: chronic condition; impaired posture, continued work and caring for kids;    How long can you sit comfortably? 20-30 min;    How long can you walk comfortably? NA   Diagnostic tests none recent;    Patient Stated Goals relieve pain so that she can get back exercising;    Currently in Pain? Yes   Pain Score 3    Pain Location Shoulder   Pain Orientation Right   Pain Descriptors / Indicators Aching;Sore   Pain Type Chronic pain   Pain Onset More than a month ago   Pain Frequency Intermittent            [facility] PT Assessment - 03/31/16 0001      AROM   Right Shoulder Flexion 150 Degrees   Right Shoulder ABduction 50 Degrees   Right Shoulder Internal Rotation 70 Degrees   Right  Shoulder External Rotation 40 Degrees     TREATMENT: Warm up on UBE BUE level 1 forward/backward x4 min (unbilled);   Instructed patient in UE stretches to reduce tightness: Doorway stretch 20 sec hold x2;  Patient required min-moderate verbal/tactile cues for correct exercise technique including cues for positioning to improve stretch and reduce pain;   Scapular depression 5 sec hold x5 with min VCs to avoid shoulder elevation;  Standing: Yellow tband in RUE: Shoulder ER 2x10; Shoulder extension 2x10; Patient required min VCs for correct positioning and to increase scapular retraction for better shoulder strengthening and less discomfort;  Facing wall: BUE shoulder protraction x10 reps with max VCS for correct positioning to improve scapular control and reduce discomfort;  Patient sidelying: RUE shoulder ER AROM 1#  x10 reps;  Patient prone: RUE shoulder extension 1# x10 with cues to avoid painful ROM; RUE shoulder low row 1# x5 reps;  RUE shoulder mid row 1# x5; Patient required min-moderate verbal/tactile cues for correct exercise technique including to avoid painful ROM and to increase scapular retraction for better scapular strength and control;   Patient supine: RUE rhythmic stabilization 30 sec hold x2 with manual resistance with cues to maintain shoulder position for better strengthening; RUE shoulder protraction x10 reps with cues to keep  elbow straight for better shoulder strengthening;   See above ROM; Patient demonstrates improve RUE shoulder ROM but is still limited with pain especially with abduction;                           PT Education - 03/31/16 1625    Education provided Yes   Education Details posture, ROM/strengthening; HEP reinforced;    Person(s) Educated Patient   Methods Explanation;Verbal cues   Comprehension Verbalized understanding;Returned demonstration;Verbal cues required             PT Long Term Goals -  03/17/16 0908      PT LONG TERM GOAL #1   Title Patient will be independent in home exercise program to improve strength/mobility for better functional independence with ADLs.   Time 6   Period Weeks   Status New     PT LONG TERM GOAL #2   Title Patient will report a worst pain of 3/10 on VAS in  right shoulder           to improve tolerance with ADLs and reduced symptoms with activities.    Time 6   Period Weeks   Status New     PT LONG TERM GOAL #3   Title Patient will improve shoulder AROM to > 140 degrees of flexion, scaption, and abduction for improved ability to perform overhead activities   Time 6   Period Weeks   Status New     PT LONG TERM GOAL #4   Title Patient will decrease Quick DASH score by > 8 points demonstrating reduced self-reported upper extremity disability.   Time 6   Period Weeks   Status New               Plan - 03/31/16 [id number]    Clinical Impression Statement Patient instructed in advanced UE strengthening exercise focusing on scapular and shoulder ER/extension strengthening to improve better shoulder kinematics. Patient required min VCs to improve exercise technique and posture for better strengthening; She reports no increase in pain but does fatigue easily with resisted exercise. She would benefit from additional skilled PT intervention to improve shoulder ROM and reduce pain with ADLs;    Rehab Potential Fair   Clinical Impairments Affecting Rehab Potential positive: young in age, motivated; negative: chronic condition, impaired posture; Patient's clinical presentation is evolving as she has started experiencing worsening of symptoms with numbness down RUE and progressing to pain and numbness down LUE shoulder;    PT Frequency 2x / week   PT Duration 6 weeks   PT Treatment/Interventions ADLs/Self Care Home Management;Cryotherapy;Electrical Stimulation;Iontophoresis 4mg /ml Dexamethasone;Moist Heat;Ultrasound;Therapeutic exercise;Therapeutic  activities;Patient/family education;Manual techniques;Taping;Energy conservation;Dry needling;Passive range of motion   PT Next Visit Plan work on shoulder stabilization exercise;    PT Home Exercise Plan continue as given;    Consulted and Agree with Plan of Care Patient      Patient will benefit from skilled therapeutic intervention in order to improve the following deficits and impairments:  Hypomobility, Pain, Impaired UE functional use, Decreased strength, Improper body mechanics, Postural dysfunction, Impaired flexibility  Visit Diagnosis: Chronic right shoulder pain  Muscle weakness (generalized)  Abnormal posture     Problem List There are no active problems to display for this patient.   Nalaysia Manganiello PT, DPT 03/31/2016, 4:59 PM  [place] MAIN [facility] SERVICES [address] [place], [place], [address] Phone: [phone]   Fax:  [phone]  Name:  Jenna [name] MRN: [id number] Date of Birth: 12/03/1965

## 2016-03-31 NOTE — Patient Instructions (Signed)
  Strengthening: Resisted Extension   Hold band in both hands, Pull arm back, elbow straight, squeezing shoulder blades, Repeat _10___ times per set. Do _2___ sets per session. Do __1__ sessions per day.  http://orth.exer.us/833

## 2016-04-02 ENCOUNTER — Encounter: Payer: Self-pay | Admitting: Physical Therapy

## 2016-04-02 ENCOUNTER — Encounter: Payer: 59 | Admitting: Physical Therapy

## 2016-04-02 ENCOUNTER — Ambulatory Visit: Payer: 59 | Attending: Family Medicine | Admitting: Physical Therapy

## 2016-04-02 DIAGNOSIS — G8929 Other chronic pain: Secondary | ICD-10-CM | POA: Insufficient documentation

## 2016-04-02 DIAGNOSIS — R293 Abnormal posture: Secondary | ICD-10-CM | POA: Diagnosis not present

## 2016-04-02 DIAGNOSIS — M25511 Pain in right shoulder: Secondary | ICD-10-CM | POA: Insufficient documentation

## 2016-04-02 DIAGNOSIS — M6281 Muscle weakness (generalized): Secondary | ICD-10-CM | POA: Insufficient documentation

## 2016-04-02 NOTE — Therapy (Signed)
[place] MAIN [facility] SERVICES [address] [place], [place], [address] Phone: [phone]   Fax:  [phone]  Physical Therapy Treatment  Patient Details  Name: Jenna [name] MRN: [id number] Date of Birth: [birth year] Referring Provider: Blenda Nicely MD  Encounter Date: 04/02/2016      PT End of Session - 04/02/16 0855    Visit Number 5   Number of Visits 13   Date for PT Re-Evaluation 04/28/16   PT Start Time 0845   PT Stop Time 0930   PT Time Calculation (min) 45 min   Activity Tolerance Patient tolerated treatment well   Behavior During Therapy [facility] for tasks assessed/performed      History reviewed. No pertinent past medical history.  Past Surgical History:  Procedure Laterality Date  . CERVICAL CERCLAGE      There were no vitals filed for this visit.      Subjective Assessment - 04/02/16 0854    Subjective Patient reports doing well today; She reports having a little more discomfort in left shoulder after last session but state that her pain is decreasing overall; reports compliance with HEP; "I have to take a conference call this morning, I hope that's okay"    Pertinent History personal factors affecting rehab: chronic condition; impaired posture, continued work and caring for kids;    How long can you sit comfortably? 20-30 min;    How long can you stand comfortably? NA   How long can you walk comfortably? NA   Diagnostic tests none recent;    Patient Stated Goals relieve pain so that she can get back exercising;    Currently in Pain? Yes   Pain Score 1    Pain Location Shoulder   Pain Orientation Right;Left   Pain Descriptors / Indicators Aching;Sore   Pain Type Chronic pain   Pain Onset More than a month ago         TREATMENT: Warm up on UBE BUE level 1 forward/backward x4 min (unbilled);   Standing: Yellow tband in RUE: Shoulder ER 2x12; Shoulder extension 2x12; Patient required min VCs for  correct positioning and to increase scapular retraction for better shoulder strengthening and less discomfort;   Pressing RUE into table and walking back to facilitate inferior glide of right glenohumeral joint during AAROM shoulder flexion x10 reps; with min VCs to avoid painful ROM;  RUE isometric shoulder abduction 5 sec hold x5 reps with cues to avoid excessive push for less discomfort; RUE shoulder depression AROM x5 reps to reduce discomfort;  Patient sidelying: RUE shoulder ER AROM 1#  2x12 reps;  Patient prone: RUE shoulder extension 1# x10 with cues to avoid painful ROM; RUE shoulder low row 1# x10 reps;  RUE shoulder mid row 1# x10 reps;  Patient required min-moderate verbal/tactile cues for correct exercise technique including to avoid painful ROM and to increase scapular retraction for better scapular strength and control;   Patient supine: Chest press 2# bar x10 reps BUE with cues to avoid painful ROM; RUE rhythmic stabilization 30 sec hold x1 with manual resistance with cues to maintain shoulder position for better strengthening; RUE shoulder small circles with shoulder in approximately 80 degrees flexion x5 clockwise/counterclockwise each;  Passive shoulder abduction 0-40 degrees to point of comfort; Grade I-II inferior glide to RUE shoulder glenohumeral joint 10 sec bouts x3 sets; Patient reports less pain after treatment session to 0/10;  PT Education - 04/02/16 0855    Education provided Yes   Education Details Posture, ROM/strengthening, HEP reinforced;    Person(s) Educated Patient   Methods Explanation;Verbal cues   Comprehension Verbalized understanding;Returned demonstration;Verbal cues required             PT Long Term Goals - 03/17/16 0908      PT LONG TERM GOAL #1   Title Patient will be independent in home exercise program to improve strength/mobility for better functional independence with  ADLs.   Time 6   Period Weeks   Status New     PT LONG TERM GOAL #2   Title Patient will report a worst pain of 3/10 on VAS in  right shoulder           to improve tolerance with ADLs and reduced symptoms with activities.    Time 6   Period Weeks   Status New     PT LONG TERM GOAL #3   Title Patient will improve shoulder AROM to > 140 degrees of flexion, scaption, and abduction for improved ability to perform overhead activities   Time 6   Period Weeks   Status New     PT LONG TERM GOAL #4   Title Patient will decrease Quick DASH score by > 8 points demonstrating reduced self-reported upper extremity disability.   Time 6   Period Weeks   Status New               Plan - 04/02/16 0913    Clinical Impression Statement Patient instructed in advanced RUE shoulder strengthening exercise. She required min VCs for cues to improve positioning and relax upper trap muscle for less shoulder discomfort; Patient fatigues quickly with increased tremors and muscle fasciulations with advanced exercise. She continues to have weakness in shoulder and scapular musculature; She would benefit from additional skilled PT intervention to improve shoulder ROM/strength and reduce pain with ADLs;    Rehab Potential Fair   Clinical Impairments Affecting Rehab Potential positive: young in age, motivated; negative: chronic condition, impaired posture; Patient's clinical presentation is evolving as she has started experiencing worsening of symptoms with numbness down RUE and progressing to pain and numbness down LUE shoulder;    PT Frequency 2x / week   PT Duration 6 weeks   PT Treatment/Interventions ADLs/Self Care Home Management;Cryotherapy;Electrical Stimulation;Iontophoresis 4mg /ml Dexamethasone;Moist Heat;Ultrasound;Therapeutic exercise;Therapeutic activities;Patient/family education;Manual techniques;Taping;Energy conservation;Dry needling;Passive range of motion   PT Next Visit Plan work on shoulder  stabilization exercise;    PT Home Exercise Plan continue as given;    Consulted and Agree with Plan of Care Patient      Patient will benefit from skilled therapeutic intervention in order to improve the following deficits and impairments:  Hypomobility, Pain, Impaired UE functional use, Decreased strength, Improper body mechanics, Postural dysfunction, Impaired flexibility  Visit Diagnosis: Chronic right shoulder pain  Muscle weakness (generalized)  Abnormal posture     Problem List There are no active problems to display for this patient.   Eliazar Olivar PT, DPT 04/02/2016, 9:31 AM  [place] MAIN [facility] SERVICES [address] [place], [place], [address] Phone: [phone]   Fax:  [phone]  Name: Jenna [name] MRN: [id number] Date of Birth: 1965-06-30

## 2016-04-07 ENCOUNTER — Ambulatory Visit: Payer: 59 | Admitting: Physical Therapy

## 2016-04-07 ENCOUNTER — Encounter: Payer: Self-pay | Admitting: Physical Therapy

## 2016-04-07 DIAGNOSIS — M25511 Pain in right shoulder: Secondary | ICD-10-CM

## 2016-04-07 DIAGNOSIS — M6281 Muscle weakness (generalized): Secondary | ICD-10-CM

## 2016-04-07 DIAGNOSIS — R293 Abnormal posture: Secondary | ICD-10-CM

## 2016-04-07 DIAGNOSIS — G8929 Other chronic pain: Secondary | ICD-10-CM | POA: Diagnosis not present

## 2016-04-07 NOTE — Therapy (Signed)
[place] MAIN [facility] SERVICES [address] [place], [place], [address] Phone: [phone]   Fax:  [phone]  Physical Therapy Treatment  Patient Details  Name: Jenna [name] MRN: [id number] Date of Birth: [birth year] Referring Provider: Blenda Nicely MD  Encounter Date: 04/07/2016      PT End of Session - 04/07/16 0817    Visit Number 6   Number of Visits 13   Date for PT Re-Evaluation 04/28/16   PT Start Time 0812   PT Stop Time 0845   PT Time Calculation (min) 33 min   Activity Tolerance Patient tolerated treatment well;No increased pain   Behavior During Therapy [facility] for tasks assessed/performed      History reviewed. No pertinent past medical history.  Past Surgical History:  Procedure Laterality Date  . CERVICAL CERCLAGE      There were no vitals filed for this visit.      Subjective Assessment - 04/07/16 0815    Subjective Patient reports doing well today; She reports that her shoulder continues to improve. She is still having a little discomfort but it is a lot less than before. Reports compliance with HEP;    Pertinent History personal factors affecting rehab: chronic condition; impaired posture, continued work and caring for kids;    How long can you sit comfortably? 20-30 min;    How long can you stand comfortably? NA   How long can you walk comfortably? NA   Diagnostic tests none recent;    Patient Stated Goals relieve pain so that she can get back exercising;    Currently in Pain? Yes   Pain Score 2    Pain Location Shoulder   Pain Orientation Right   Pain Descriptors / Indicators Aching;Sore   Pain Type Chronic pain   Pain Onset More than a month ago        TREATMENT: Warm up on UBE BUE level 1 forward/backward x4 min (unbilled);   Standing: Yellow tband in RUE: Shoulder ER 2x15; Shoulder extension 2x15; Shoulder low row 2x10; Patient required min VCs for correct positioning and to increase  scapular retraction for better shoulder strengthening and less discomfort;  Standing with back against door: Yellow tband shoulder extension AROM with RUE shoulder flexion AAROM x10 reps;    Patient sidelying: RUE shoulder ER AROM 1# 2x15 reps;  Patient prone: RUE shoulder extension 1# x12 with cues to avoid painful ROM; RUE shoulder low row 1# x12 reps;  RUE shoulder mid row 1# x12 reps;  Patient required min-moderate verbal/tactile cues for correct exercise techniqueincluding to avoid painful ROM and to increase scapular retraction for better scapular strength and control;   Patient supine: RUE rhythmic stabilization 30 sec hold x2 with RUE shoulder in open packed position, with manual resistance with cues to maintain shoulder position for better strengthening; RUE shoulder small circles with shoulder in approximately 80 degrees flexion x15 clockwise/counterclockwise each; RUE shoulder protraction 1# x15 with min VCs and tactile cues to keep elbow straight for better scapular movement;  Patient reports slight discomfort in RUE shoulder upper trap to 4/10 at end of session; Instructed patient in shoulder depression and emphasized importance of shoulder depression to relax upper trap;                           PT Education - 04/07/16 0816    Education provided Yes   Education Details ROM/strengthening, HEP reinforced;    Person(s)  Educated Patient   Methods Explanation;Verbal cues   Comprehension Verbalized understanding;Returned demonstration;Verbal cues required             PT Long Term Goals - 03/17/16 0908      PT LONG TERM GOAL #1   Title Patient will be independent in home exercise program to improve strength/mobility for better functional independence with ADLs.   Time 6   Period Weeks   Status New     PT LONG TERM GOAL #2   Title Patient will report a worst pain of 3/10 on VAS in  right shoulder           to improve tolerance with  ADLs and reduced symptoms with activities.    Time 6   Period Weeks   Status New     PT LONG TERM GOAL #3   Title Patient will improve shoulder AROM to > 140 degrees of flexion, scaption, and abduction for improved ability to perform overhead activities   Time 6   Period Weeks   Status New     PT LONG TERM GOAL #4   Title Patient will decrease Quick DASH score by > 8 points demonstrating reduced self-reported upper extremity disability.   Time 6   Period Weeks   Status New               Plan - 04/07/16 [id number]    Clinical Impression Statement Patient late to session; Instructed patient in advanced RUE strengthening exercise for scapula and shoulder muscles. Patient requires min VCs to improve erect posture and improve scapular movement with advanced exercise. Patient responds well to cues with less shoulder pain following instruction for correct positioning and exercise movement; She would benefit from additional skilled PT intervention to improve shoulder ROM and reduce pain with ADLs;    Rehab Potential Fair   Clinical Impairments Affecting Rehab Potential positive: young in age, motivated; negative: chronic condition, impaired posture; Patient's clinical presentation is evolving as she has started experiencing worsening of symptoms with numbness down RUE and progressing to pain and numbness down LUE shoulder;    PT Frequency 2x / week   PT Duration 6 weeks   PT Treatment/Interventions ADLs/Self Care Home Management;Cryotherapy;Electrical Stimulation;Iontophoresis 4mg /ml Dexamethasone;Moist Heat;Ultrasound;Therapeutic exercise;Therapeutic activities;Patient/family education;Manual techniques;Taping;Energy conservation;Dry needling;Passive range of motion   PT Next Visit Plan work on shoulder stabilization exercise;    PT Home Exercise Plan continue as given;    Consulted and Agree with Plan of Care Patient      Patient will benefit from skilled therapeutic intervention in order  to improve the following deficits and impairments:  Hypomobility, Pain, Impaired UE functional use, Decreased strength, Improper body mechanics, Postural dysfunction, Impaired flexibility  Visit Diagnosis: Muscle weakness (generalized)  Chronic right shoulder pain  Abnormal posture     Problem List There are no active problems to display for this patient.   Donabelle Molden PT, DPT 04/07/2016, 8:47 AM  [place] MAIN [facility] SERVICES [address] [place], [place], [address] Phone: [phone]   Fax:  [phone]  Name: Jenna [name] MRN: [id number] Date of Birth: [birth year]

## 2016-04-09 ENCOUNTER — Encounter: Payer: 59 | Admitting: Physical Therapy

## 2016-04-09 ENCOUNTER — Ambulatory Visit: Payer: 59 | Admitting: Physical Therapy

## 2016-04-14 ENCOUNTER — Ambulatory Visit: Payer: 59 | Admitting: Physical Therapy

## 2016-04-14 ENCOUNTER — Encounter: Payer: Self-pay | Admitting: Physical Therapy

## 2016-04-14 DIAGNOSIS — M6281 Muscle weakness (generalized): Secondary | ICD-10-CM | POA: Diagnosis not present

## 2016-04-14 DIAGNOSIS — R293 Abnormal posture: Secondary | ICD-10-CM

## 2016-04-14 DIAGNOSIS — G8929 Other chronic pain: Secondary | ICD-10-CM

## 2016-04-14 DIAGNOSIS — M25511 Pain in right shoulder: Secondary | ICD-10-CM | POA: Diagnosis not present

## 2016-04-14 NOTE — Therapy (Signed)
[place] MAIN [facility] SERVICES [address] [place], [place], [address] Phone: [phone]   Fax:  [phone]  Physical Therapy Treatment  Patient Details  Name: Jenna [name] MRN: [id number] Date of Birth: [birth year] Referring Provider: Blenda Nicely MD  Encounter Date: 04/14/2016      PT End of Session - 04/14/16 0817    Visit Number 7   Number of Visits 13   Date for PT Re-Evaluation 04/28/16   PT Start Time 0813   PT Stop Time 0845   PT Time Calculation (min) 32 min   Activity Tolerance Patient tolerated treatment well;No increased pain   Behavior During Therapy [facility] for tasks assessed/performed      History reviewed. No pertinent past medical history.  Past Surgical History:  Procedure Laterality Date  . CERVICAL CERCLAGE      There were no vitals filed for this visit.      Subjective Assessment - 04/14/16 0815    Subjective Patient reports still not feeling severe pain in her shoulder like she was before. She still feels weakness and some discomfort in posterior right shoulder with prolonged activity. She reports still having trouble sleeping at night with discomfort in both shoulders.    Pertinent History personal factors affecting rehab: chronic condition; impaired posture, continued work and caring for kids;    How long can you sit comfortably? 20-30 min;    How long can you stand comfortably? NA   How long can you walk comfortably? NA   Diagnostic tests none recent;    Patient Stated Goals relieve pain so that she can get back exercising;    Currently in Pain? Yes   Pain Score 1    Pain Location Shoulder   Pain Orientation Right   Pain Descriptors / Indicators Aching;Sore   Pain Type Chronic pain   Pain Onset More than a month ago          TREATMENT: Warm up on UBE BUE level 1 forward/backward x37min (unbilled);   Standing: Red tband in RUE: Shoulder ER 2x10; Shoulder extension 2x10; Shoulder low  row 2x10; Patient required min VCs for correct positioning and to increase scapular retraction for better shoulder strengthening and less discomfort;   Patient sitting: Shoulder flexion with mobilization with movement, inferior glide to right glenohumeral joint with strap x10 reps x3 sets. Patient reports slight discomfort along posterior right shoulder but no pain in glenohumeral joint with inferior glide. She was able to demonstrate better flexion during 3rd set as compared to 1st set.  RUE shoulder flexion at end of session: 118 degrees with pain noted to begin at 90 degrees with increased shoulder elevation.                        PT Education - 04/14/16 0816    Education provided Yes   Education Details ROM/strengthening, HEP reinforced,    Person(s) Educated Patient   Methods Explanation;Verbal cues   Comprehension Verbalized understanding;Returned demonstration;Verbal cues required             PT Long Term Goals - 03/17/16 0908      PT LONG TERM GOAL #1   Title Patient will be independent in home exercise program to improve strength/mobility for better functional independence with ADLs.   Time 6   Period Weeks   Status New     PT LONG TERM GOAL #2   Title Patient will report a worst pain of  3/10 on VAS in  right shoulder           to improve tolerance with ADLs and reduced symptoms with activities.    Time 6   Period Weeks   Status New     PT LONG TERM GOAL #3   Title Patient will improve shoulder AROM to > 140 degrees of flexion, scaption, and abduction for improved ability to perform overhead activities   Time 6   Period Weeks   Status New     PT LONG TERM GOAL #4   Title Patient will decrease Quick DASH score by > 8 points demonstrating reduced self-reported upper extremity disability.   Time 6   Period Weeks   Status New               Plan - 04/14/16 0909    Clinical Impression Statement Patient late to session and had to  leave early due to work demands. She was instructed in UE shoulder strengthening. Advanced with red tband for better resistance. Patient requires increased cues to avoid shoulder elevation for better shoulder glenohumeral joint mobility. Patient continues to have pain in right shoulder with flexion >90 degrees demonstrating decreased inferior glide in glenohumeral joint with shoulder elevation for compensation. She would benefit from additional skilled PT intervention to improve shoulder ROM and reduce pain with ADLs.    Rehab Potential Fair   Clinical Impairments Affecting Rehab Potential positive: young in age, motivated; negative: chronic condition, impaired posture; Patient's clinical presentation is evolving as she has started experiencing worsening of symptoms with numbness down RUE and progressing to pain and numbness down LUE shoulder;    PT Frequency 2x / week   PT Duration 6 weeks   PT Treatment/Interventions ADLs/Self Care Home Management;Cryotherapy;Electrical Stimulation;Iontophoresis 4mg /ml Dexamethasone;Moist Heat;Ultrasound;Therapeutic exercise;Therapeutic activities;Patient/family education;Manual techniques;Taping;Energy conservation;Dry needling;Passive range of motion   PT Next Visit Plan work on shoulder stabilization exercise;    PT Home Exercise Plan continue as given;    Consulted and Agree with Plan of Care Patient      Patient will benefit from skilled therapeutic intervention in order to improve the following deficits and impairments:  Hypomobility, Pain, Impaired UE functional use, Decreased strength, Improper body mechanics, Postural dysfunction, Impaired flexibility  Visit Diagnosis: Muscle weakness (generalized)  Chronic right shoulder pain  Abnormal posture     Problem List There are no active problems to display for this patient.   Jahn Franchini PT, DPT 04/14/2016, 9:11 AM  [place] MAIN [facility] SERVICES [address] [place], [place], [address] Phone: [phone]   Fax:  [phone]  Name: Jenna [name] MRN: [id number] Date of Birth: Aug 04, 1965

## 2016-04-15 ENCOUNTER — Encounter: Payer: Self-pay | Admitting: Physical Therapy

## 2016-04-16 ENCOUNTER — Ambulatory Visit: Payer: 59 | Admitting: Physical Therapy

## 2016-04-16 ENCOUNTER — Encounter: Payer: 59 | Admitting: Physical Therapy

## 2016-04-23 ENCOUNTER — Ambulatory Visit: Payer: 59 | Admitting: Physical Therapy

## 2016-04-27 ENCOUNTER — Encounter: Payer: 59 | Admitting: Physical Therapy

## 2016-04-29 ENCOUNTER — Encounter: Payer: 59 | Admitting: Physical Therapy

## 2016-05-21 ENCOUNTER — Ambulatory Visit: Payer: 59 | Attending: Family Medicine | Admitting: Physical Therapy

## 2016-05-21 ENCOUNTER — Encounter: Payer: Self-pay | Admitting: Physical Therapy

## 2016-05-21 DIAGNOSIS — M25511 Pain in right shoulder: Secondary | ICD-10-CM | POA: Insufficient documentation

## 2016-05-21 DIAGNOSIS — R293 Abnormal posture: Secondary | ICD-10-CM | POA: Insufficient documentation

## 2016-05-21 DIAGNOSIS — M6281 Muscle weakness (generalized): Secondary | ICD-10-CM | POA: Insufficient documentation

## 2016-05-21 DIAGNOSIS — G8929 Other chronic pain: Secondary | ICD-10-CM | POA: Insufficient documentation

## 2016-05-21 NOTE — Therapy (Signed)
[place] MAIN [facility] SERVICES [address] [place], [place], [address] Phone: [phone]   Fax:  [phone]  Physical Therapy Treatment/Re-eval  Patient Details  Name: Jenna [name] MRN: [id number] Date of Birth: [birth year] Referring Provider: Blenda Nicely MD  Encounter Date: 05/21/2016      PT End of Session - 05/21/16 0829    Visit Number 8   Number of Visits 29   Date for PT Re-Evaluation 07/16/16   Authorization Type no gcodes   PT Start Time 0802   PT Stop Time 0845   PT Time Calculation (min) 43 min   Activity Tolerance Patient limited by fatigue;Patient limited by pain   Behavior During Therapy [facility] for tasks assessed/performed      History reviewed. No pertinent past medical history.  Past Surgical History:  Procedure Laterality Date  . CERVICAL CERCLAGE      There were no vitals filed for this visit.      Subjective Assessment - 05/21/16 0808    Subjective Patient reports that she stopped therapy about a month ago because she was concerned that her therapy was not covered; She reports continued increased in shoulder pain, especially on right side; She reports still having trouble with sleeping;    Pertinent History personal factors affecting rehab: chronic condition; impaired posture, continued work and caring for kids;    How long can you sit comfortably? 20-30 min;    How long can you stand comfortably? NA   How long can you walk comfortably? NA   Diagnostic tests none recent;    Patient Stated Goals relieve pain so that she can get back exercising;    Currently in Pain? Yes   Pain Score 6    Pain Location Shoulder   Pain Orientation Right   Pain Descriptors / Indicators Aching;Sore   Pain Type Chronic pain   Pain Onset More than a month ago   Pain Frequency Constant   Aggravating Factors  lying down, movement,    Pain Relieving Factors heat helps some; started taking some tylenol but doesn't feel like it  helps;    Effect of Pain on Daily Activities decreased; still working full-time;             [facility] PT Assessment - 05/21/16 0001      Assessment   Next MD Visit next week;      Posture/Postural Control   Posture Comments pt continues to demonstrate a slumped posture with mild forward shoulders and moderate forward head;      AROM   Right Shoulder Extension 48 Degrees   Right Shoulder Flexion 98 Degrees   Right Shoulder ABduction 72 Degrees   Right Shoulder Internal Rotation 70 Degrees  at 50 degrees abduction   Right Shoulder External Rotation 55 Degrees  at 50 degrees abduction   Left Shoulder Extension 50 Degrees   Left Shoulder Flexion 124 Degrees   Left Shoulder ABduction 70 Degrees   Left Shoulder Internal Rotation 60 Degrees  90 degrees abduction   Left Shoulder External Rotation 70 Degrees  90 degrees abduction     Strength   Overall Strength Comments RUE grossly 3-/5; scapular paraspinals 2+/5 on right side;       TREATMENT: Patient continues to demonstrate impaired posture and impinged right shoulder;  Patient supine: Grade I-II Inferior joint glides to right glenohumeral joint 10 sec bouts x3 sets; Grade II AP/PA mobs to right GH joint x10 sec x2 sets; Gentle GH distraction  x10 sec hold x2 min;  Patient requires constant cues to relax during PROM for better joint mobility;  Patient in sitting: Mobilization with movement, inferior glide with shoulder flexion x5 reps, x3 sets with patient requiring cues for postural control and to avoid painful ROM;  Patient in sidelying: RUE shoulder ER 1# 2x10; Patient reports increased discomfort and fatigue with exercise; She required min VCs for positioning to improve shoulder strengthening;  Patient prone: RUE shoulder extension 1# x10 RUE shoulder low rows 1# x10; Patient required min-moderate verbal/tactile cues for correct exercise technique including positioning and to avoid shoulder IR;  Patient  standing: RUE shoulder extension red tband x10 with cues to avoid shoulder elevation; RUE shoulder ER red tband 2x10 with cues to avoid upper trap activation and to improve shoulder ER for better strengthening;  RUE shoulder flexion following exercise: 108 degrees; patient continues to have pain in right shoulder but demonstrates improved ROM;                       PT Education - 05/21/16 0829    Education provided Yes   Education Details recommendations, ROM/strengthening, HEP reinforced;    Person(s) Educated Patient   Methods Explanation;Verbal cues   Comprehension Verbalized understanding;Returned demonstration;Verbal cues required             PT Long Term Goals - 05/21/16 1322      PT LONG TERM GOAL #1   Title Patient will be independent in home exercise program to improve strength/mobility for better functional independence with ADLs.   Time 8   Period Weeks   Status On-going     PT LONG TERM GOAL #2   Title Patient will report a worst pain of 3/10 on VAS in  right shoulder           to improve tolerance with ADLs and reduced symptoms with activities.    Time 8   Period Weeks   Status Not Met     PT LONG TERM GOAL #3   Title Patient will improve shoulder AROM to > 140 degrees of flexion, scaption, and abduction for improved ability to perform overhead activities   Time 8   Period Weeks   Status Not Met     PT LONG TERM GOAL #4   Title Patient will decrease Quick DASH score by > 8 points demonstrating reduced self-reported upper extremity disability.   Time 8   Period Weeks   Status Not Met               Plan - 05/21/16 1319    Clinical Impression Statement Patient reports that she missed about a month of therapy due to working on insurance coverage; Patient continues to have UE shoulder discomfort. Patient exhibits continued postural deficits which contributed to pain. Patient continues to have decreased shoulder ROM and weakness.  Patient would benefit from skilled PT intervention to improve shoulder ROM and strength and reduce pain with ADLs;    Rehab Potential Fair   Clinical Impairments Affecting Rehab Potential positive: young in age, motivated; negative: chronic condition, impaired posture; Patient's clinical presentation is evolving as she has started experiencing worsening of symptoms with numbness down RUE and progressing to pain and numbness down LUE shoulder;    PT Frequency 2x / week   PT Duration 8 weeks   PT Treatment/Interventions ADLs/Self Care Home Management;Cryotherapy;Electrical Stimulation;Iontophoresis 58m/ml Dexamethasone;Moist Heat;Ultrasound;Therapeutic exercise;Therapeutic activities;Patient/family education;Manual techniques;Taping;Energy conservation;Dry needling;Passive range of motion   PT Next  Visit Plan work on shoulder stabilization exercise;    PT Home Exercise Plan continue as given;    Consulted and Agree with Plan of Care Patient      Patient will benefit from skilled therapeutic intervention in order to improve the following deficits and impairments:  Hypomobility, Pain, Impaired UE functional use, Decreased strength, Improper body mechanics, Postural dysfunction, Impaired flexibility  Visit Diagnosis: Muscle weakness (generalized) - Plan: PT plan of care cert/re-cert  Chronic right shoulder pain - Plan: PT plan of care cert/re-cert  Abnormal posture - Plan: PT plan of care cert/re-cert     Problem List There are no active problems to display for this patient.   Trotter,Margaret PT, DPT 05/21/2016, 1:25 PM  [place] MAIN [facility] SERVICES [address] [place], [place], [address] Phone: [phone]   Fax:  [phone]  Name: BOSTYN KUNKLER MRN: [id number] Date of Birth: 1965-11-16

## 2016-05-25 ENCOUNTER — Encounter: Payer: Self-pay | Admitting: Physical Therapy

## 2016-05-25 ENCOUNTER — Ambulatory Visit: Payer: 59 | Admitting: Physical Therapy

## 2016-05-25 DIAGNOSIS — Z6829 Body mass index (BMI) 29.0-29.9, adult: Secondary | ICD-10-CM | POA: Diagnosis not present

## 2016-05-25 DIAGNOSIS — M6281 Muscle weakness (generalized): Secondary | ICD-10-CM

## 2016-05-25 DIAGNOSIS — R293 Abnormal posture: Secondary | ICD-10-CM

## 2016-05-25 DIAGNOSIS — M25511 Pain in right shoulder: Secondary | ICD-10-CM

## 2016-05-25 DIAGNOSIS — G8929 Other chronic pain: Secondary | ICD-10-CM | POA: Diagnosis not present

## 2016-05-25 DIAGNOSIS — M62830 Muscle spasm of back: Secondary | ICD-10-CM | POA: Diagnosis not present

## 2016-05-25 NOTE — Therapy (Signed)
[place] MAIN [facility] SERVICES [address] [place], [place], [address] Phone: [phone]   Fax:  [phone]  Physical Therapy Treatment  Patient Details  Name: [name] MRN: [id number] Date of Birth: Nov 24, 1965 Referring Provider: Blenda Nicely MD  Encounter Date: 05/25/2016      PT End of Session - 05/25/16 0819    Visit Number 9   Number of Visits 29   Date for PT Re-Evaluation 07/16/16   Authorization Type no gcodes   PT Start Time 0815   PT Stop Time 0845   PT Time Calculation (min) 30 min   Activity Tolerance Patient limited by fatigue;Patient limited by pain   Behavior During Therapy [facility] for tasks assessed/performed      History reviewed. No pertinent past medical history.  Past Surgical History:  Procedure Laterality Date  . CERVICAL CERCLAGE      There were no vitals filed for this visit.      Subjective Assessment - 05/25/16 0817    Subjective Patient reports, "I am sorry I am late. 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 20-30 min;    How long can you stand comfortably? NA   How long can you walk comfortably? NA   Diagnostic tests none recent;    Patient Stated Goals relieve pain so that she can get back exercising;    Currently in Pain? Yes   Pain Score 3    Pain Location Shoulder   Pain Orientation Right   Pain Descriptors / Indicators Aching;Sore   Pain Type Chronic pain   Pain Onset More than a month ago   Pain Frequency Constant         TREATMENT: Warm up on UBE BUE, backwards only level 1 x3 min (Unbilled)  Patient in sitting: PT applied McConnell  tape to right shoulder with shoulder retraction, depression and ER to facilitate better glenohumeral joint position for less discomfort; She reports immediate pain relief with no dull ache following tape application; Patient educated on safe tape wear including to wear until tomorrow if possible but to remove if feeling hot, seeing redness or feeling itchy as this is a sign of allergic reaction;   Patient in sidelying: RUE shoulder ER 1# 2x12; Patient reports increased discomfort and fatigue with exercise; She required min VCs for positioning to improve shoulder strengthening;  Patient prone: RUE shoulder extension 1# x12 RUE shoulder low rows 1# x12; RUE shoulder mid row 1# x12; Patient required min-moderate verbal/tactile cues for correct exercise technique including positioning and to avoid shoulder IR;  Patient responded well to cues for correct exercise positioning; She reports less pain at end of session as compared to beginning of session;                         PT Education - 05/25/16 0818    Education provided Yes   Education Details shoulder ROM/strengthening exercise, posture re-education;    Person(s) Educated Patient   Methods Explanation;Verbal cues   Comprehension Verbalized understanding;Returned demonstration;Verbal cues required  PT Long Term Goals - 05/21/16 1322      PT LONG TERM GOAL #1   Title Patient will be independent in home exercise program to improve strength/mobility for better functional independence with ADLs.   Time 8   Period Weeks   Status On-going     PT LONG TERM GOAL #2   Title Patient will report a worst pain of 3/10 on VAS in  right shoulder           to improve tolerance with ADLs and reduced symptoms with activities.    Time 8   Period Weeks   Status Not Met     PT LONG TERM GOAL #3   Title Patient will improve shoulder AROM to > 140 degrees of flexion, scaption, and abduction for improved ability to  perform overhead activities   Time 8   Period Weeks   Status Not Met     PT LONG TERM GOAL #4   Title Patient will decrease Quick DASH score by > 8 points demonstrating reduced self-reported upper extremity disability.   Time 8   Period Weeks   Status Not Met               Plan - 05/25/16 0844    Clinical Impression Statement Patient late to treatment session; Instructed patient in strengthening exercise to facilitate better shoulder positioning; she responded well to cues but still has difficulty relaxing right upper trap with overhead movement; PT applied McConnell tape to facilitate better shoulder positioning and posture; Patient reports immediate pain relief in shoulder. She would benefit from additional skilled PT intervention to improve shoulder ROM and reduce pain with ADLs;    Rehab Potential Fair   Clinical Impairments Affecting Rehab Potential positive: young in age, motivated; negative: chronic condition, impaired posture; Patient's clinical presentation is evolving as she has started experiencing worsening of symptoms with numbness down RUE and progressing to pain and numbness down LUE shoulder;    PT Frequency 2x / week   PT Duration 8 weeks   PT Treatment/Interventions ADLs/Self Care Home Management;Cryotherapy;Electrical Stimulation;Iontophoresis 33m/ml Dexamethasone;Moist Heat;Ultrasound;Therapeutic exercise;Therapeutic activities;Patient/family education;Manual techniques;Taping;Energy conservation;Dry needling;Passive range of motion   PT Next Visit Plan work on shoulder stabilization exercise;    PT Home Exercise Plan continue as given;    Consulted and Agree with Plan of Care Patient      Patient will benefit from skilled therapeutic intervention in order to improve the following deficits and impairments:  Hypomobility, Pain, Impaired UE functional use, Decreased strength, Improper body mechanics, Postural dysfunction, Impaired flexibility  Visit  Diagnosis: Muscle weakness (generalized)  Chronic right shoulder pain  Abnormal posture     Problem List There are no active problems to display for this patient.   Trotter,Margaret PT, DPT 05/25/2016, 8:45 AM  [place] [facility] [address] [address]Phone: [phone]  Fax:  [phone] Name: BTRINDA HARLACHERMRN: [id number] of Birth: [birth year]

## 2016-05-27 ENCOUNTER — Encounter: Payer: Self-pay | Admitting: Physical Therapy

## 2016-05-27 ENCOUNTER — Ambulatory Visit: Payer: 59 | Admitting: Physical Therapy

## 2016-05-27 DIAGNOSIS — M6281 Muscle weakness (generalized): Secondary | ICD-10-CM

## 2016-05-27 DIAGNOSIS — R293 Abnormal posture: Secondary | ICD-10-CM | POA: Diagnosis not present

## 2016-05-27 DIAGNOSIS — M25511 Pain in right shoulder: Secondary | ICD-10-CM | POA: Diagnosis not present

## 2016-05-27 DIAGNOSIS — G8929 Other chronic pain: Secondary | ICD-10-CM

## 2016-05-27 NOTE — Therapy (Signed)
[place] MAIN [facility] SERVICES [address] [place], [place], [address] Phone: [phone]   Fax:  [phone]  Physical Therapy Treatment  Patient Details  Name: Jenna [name] MRN: [id number] Date of Birth: 01-28-1966 Referring Provider: Blenda Nicely MD  Encounter Date: 05/27/2016      PT End of Session - 05/27/16 0813    Visit Number 10   Number of Visits 29   Date for PT Re-Evaluation 07/16/16   Authorization Type no gcodes   PT Start Time 0808   PT Stop Time 0846   PT Time Calculation (min) 38 min   Activity Tolerance Patient tolerated treatment well   Behavior During Therapy [facility] for tasks assessed/performed      History reviewed. No pertinent past medical history.  Past Surgical History:  Procedure Laterality Date  . CERVICAL CERCLAGE      There were no vitals filed for this visit.      Subjective Assessment - 05/27/16 0812    Subjective Patient reports significant pain relief with taping. She states, "I was able to sleep without pain and saw a big difference in my daily activities. I was even able to go to the gym this week and it was a lot better."    Pertinent History personal factors affecting rehab: chronic condition; impaired posture, continued work and caring for kids;    How long can you sit comfortably? 20-30 min;    How long can you stand comfortably? NA   How long can you walk comfortably? NA   Diagnostic tests none recent;    Patient Stated Goals relieve pain so that she can get back exercising;    Currently in Pain? Yes   Pain Score 2    Pain Location Shoulder   Pain Orientation Right   Pain Descriptors / Indicators Aching;Sore   Pain Type Chronic pain   Pain Onset More than a month ago   Pain Frequency Intermittent   Aggravating Factors  poor posture,    Pain Relieving Factors taping            [facility] PT Assessment - 05/27/16 0001      AROM   Right Shoulder Flexion 145 Degrees   Right  Shoulder ABduction 90 Degrees      TREATMENT: Warm up on UBE BUE, backwards only level 1 x3 min (Unbilled)  Patient standing Red tband shoulder extension x12 Red tband shoulder low row x12 Red tband shoulder ER x12 Patient required min-moderate verbal/tactile cues for correct exercise technique including cues to isolate scapular movement and posterior shoulder muscle activation;  Standing facing wall: Shoulder protraction/retraction x10 reps with mod VCs for positioning and to isolate scapular movement;   Patient in sidelying: RUE shoulder ER 1# 2x15; Patient reports increased discomfort and fatigue with exercise; She required min VCs for positioning to improve shoulder strengthening;  Patient prone: RUE shoulder extension 1# x15; RUE shoulder low rows 1# x15; RUE shoulder mid row 1# x15; Patient required min-moderate verbal/tactile cues for correct exercise technique including positioning and to avoid shoulder IR;  Patient supine: RUE 1# shoulder protraction x10 reps; RUE rhythmic stabilization in open packed position x60 sec with manual resistance in all directions to facilitate better shoulder stability;  Patient standing: RUE pushing down into table while walking backwards to facilitate better glenohumeral inferior glide during shoulder flexion x5 reps;  Patient in sitting: PT applied McConnell tape to right shoulder with shoulder retraction, depression and ER to facilitate better glenohumeral  joint position for less discomfort; She reports immediate pain relief with no dull ache following tape application; Patient educated on safe tape wear including to wear until tomorrow if possible but to remove if feeling hot, seeing redness or feeling itchy as this is a sign of allergic reaction;   Patient responded well to cues for correct exercise positioning; She reports less pain at end of session as compared to beginning of session; She also demonstrates significant improvement  in shoulder ROM as evidenced above;                          PT Education - 05/27/16 0813    Education provided Yes   Education Details shoulder ROM/strengthening, posture re-education;    Person(s) Educated Patient   Methods Explanation;Verbal cues   Comprehension Verbalized understanding;Returned demonstration;Verbal cues required             PT Long Term Goals - 05/21/16 1322      PT LONG TERM GOAL #1   Title Patient will be independent in home exercise program to improve strength/mobility for better functional independence with ADLs.   Time 8   Period Weeks   Status On-going     PT LONG TERM GOAL #2   Title Patient will report a worst pain of 3/10 on VAS in  right shoulder           to improve tolerance with ADLs and reduced symptoms with activities.    Time 8   Period Weeks   Status Not Met     PT LONG TERM GOAL #3   Title Patient will improve shoulder AROM to > 140 degrees of flexion, scaption, and abduction for improved ability to perform overhead activities   Time 8   Period Weeks   Status Not Met     PT LONG TERM GOAL #4   Title Patient will decrease Quick DASH score by > 8 points demonstrating reduced self-reported upper extremity disability.   Time 8   Period Weeks   Status Not Met               Plan - 05/27/16 0827    Clinical Impression Statement Patient responded well to mcconnell tape with significant reduction in pain with sleeping and work tasks; She also reports increased ease with shoulder movement. Patient instructed in advanced postural strengthening and stretches to facilitate better glenohumeral joint movement for better ROM. Patient continues to require cues for correct positioning and to relax upper trap for better tolerance with exercise. She would benefit from additional skilled PT intervention to reduce shoulder pain and return to PLOF.   Rehab Potential Fair   Clinical Impairments Affecting Rehab Potential  positive: young in age, motivated; negative: chronic condition, impaired posture; Patient's clinical presentation is evolving as she has started experiencing worsening of symptoms with numbness down RUE and progressing to pain and numbness down LUE shoulder;    PT Frequency 2x / week   PT Duration 8 weeks   PT Treatment/Interventions ADLs/Self Care Home Management;Cryotherapy;Electrical Stimulation;Iontophoresis 79m/ml Dexamethasone;Moist Heat;Ultrasound;Therapeutic exercise;Therapeutic activities;Patient/family education;Manual techniques;Taping;Energy conservation;Dry needling;Passive range of motion   PT Next Visit Plan work on shoulder stabilization exercise;    PT Home Exercise Plan continue as given;    Consulted and Agree with Plan of Care Patient      Patient will benefit from skilled therapeutic intervention in order to improve the following deficits and impairments:  Hypomobility, Pain, Impaired UE functional use,  Decreased strength, Improper body mechanics, Postural dysfunction, Impaired flexibility  Visit Diagnosis: Muscle weakness (generalized)  Chronic right shoulder pain  Abnormal posture     Problem List There are no active problems to display for this patient.   Jori Thrall PT, DPT 05/27/2016, 8:46 AM  [place] MAIN [facility] SERVICES [address] [place], [place], [address] Phone: [phone]   Fax:  [phone]  Name: Jenna [name] MRN: [id number] Date of Birth: 1965/04/17

## 2016-06-01 ENCOUNTER — Encounter: Payer: 59 | Admitting: Physical Therapy

## 2016-06-03 ENCOUNTER — Ambulatory Visit: Payer: 59 | Attending: Family Medicine | Admitting: Physical Therapy

## 2016-06-03 ENCOUNTER — Encounter: Payer: Self-pay | Admitting: Physical Therapy

## 2016-06-03 DIAGNOSIS — M6281 Muscle weakness (generalized): Secondary | ICD-10-CM | POA: Diagnosis not present

## 2016-06-03 DIAGNOSIS — M25511 Pain in right shoulder: Secondary | ICD-10-CM | POA: Diagnosis not present

## 2016-06-03 DIAGNOSIS — G8929 Other chronic pain: Secondary | ICD-10-CM | POA: Insufficient documentation

## 2016-06-03 DIAGNOSIS — R293 Abnormal posture: Secondary | ICD-10-CM | POA: Diagnosis not present

## 2016-06-03 NOTE — Therapy (Signed)
[facility] MAIN [facility] SERVICES [address] [place], [place], [address] Phone: [phone]   Fax:  [phone]  Physical Therapy Treatment  Patient Details  Name: Jenna [name] MRN: [id number] Date of Birth: 12-16-1965 Referring Provider: Charlotta Newton MD  Encounter Date: 06/03/2016      PT End of Session - 06/03/16 0813    Visit Number 11   Number of Visits 29   Date for PT Re-Evaluation 07/16/16   Authorization Type no gcodes   PT Start Time 0808   PT Stop Time 0846   PT Time Calculation (min) 38 min   Activity Tolerance Patient tolerated treatment well   Behavior During Therapy [facility] for tasks assessed/performed      History reviewed. No pertinent past medical history.  Past Surgical History:  Procedure Laterality Date  . CERVICAL CERCLAGE      There were no vitals filed for this visit.      Subjective Assessment - 06/03/16 0811    Subjective Patient reports that her pain is improved; She did have to take the tape off after it got wet because it started itching; She reports sleeping is still a little better; She reports having a good trip;    Pertinent History personal factors affecting rehab: chronic condition; impaired posture, continued work and caring for kids;    How long can you sit comfortably? 20-30 min;    How long can you stand comfortably? NA   How long can you walk comfortably? NA   Diagnostic tests none recent;    Patient Stated Goals relieve pain so that she can get back exercising;    Currently in Pain? Yes   Pain Score 1    Pain Location Shoulder   Pain Orientation Right   Pain Descriptors / Indicators Aching;Sore   Pain Type Chronic pain   Pain Onset More than a month ago   Pain Frequency Intermittent   Aggravating Factors  poor posture, prolonged sitting/reaching   Pain Relieving Factors tape, exercise;    Effect of Pain on Daily Activities decreased; still working full-time;          TREATMENT: Warm up on UBE BUE, backwards only level 1 x3 min (Unbilled)  Patient standing Red tband shoulder extension x15 Red tband shoulder low row x15 Red tband shoulder ER 2x10 Patient required min-moderate verbal/tactile cues for correct exercise technique including cues to isolate scapular movement and posterior shoulder muscle activation;  Shoulder protraction red tband around BUE, x10 reps with mod VCs for positioning and to isolate scapular movement;  Facing wall: Low "V" to high  "V" with forearm on wall for scapular mobility x5 reps AROM with min VCs for positioning;   Patient in sidelying: RUE shoulder ER 2#, 2x12; Patient reports increased discomfort and fatigue with exercise; She required min VCs for positioning to improve shoulder strengthening;  Patient prone: RUE shoulder extension 2# x10; RUE shoulder low rows 2# x10 RUE shoulder mid row 2# x10; Patient required min-moderate verbal/tactile cues for correct exercise technique including positioning and to avoid shoulder IR;  Patient supine: RUE 1# shoulder flexion with elbow bent 2x10 reps; RUE shoulder protraction 1# x10 RUE shoulder small circles clockwise/counterclockwise 1# x10   Seated scapular retraction BUE x5 reps with min Vcs to avoid shoulder elevation;   Patient responded well to cues for correct exercise positioning; She reports less pain at end of session as compared to beginning of session; She also demonstrates significant improvement in shoulder  ROM as evidenced above;                            PT Education - 06/03/16 0813    Education provided Yes   Education Details shoulder ROM/strengthening, posture-re-ed;    Person(s) Educated Patient   Methods Explanation;Verbal cues   Comprehension Verbalized understanding;Returned demonstration;Verbal cues required             PT Long Term Goals - 05/21/16 1322      PT LONG TERM GOAL #1   Title Patient will be  independent in home exercise program to improve strength/mobility for better functional independence with ADLs.   Time 8   Period Weeks   Status On-going     PT LONG TERM GOAL #2   Title Patient will report a worst pain of 3/10 on VAS in  right shoulder           to improve tolerance with ADLs and reduced symptoms with activities.    Time 8   Period Weeks   Status Not Met     PT LONG TERM GOAL #3   Title Patient will improve shoulder AROM to > 140 degrees of flexion, scaption, and abduction for improved ability to perform overhead activities   Time 8   Period Weeks   Status Not Met     PT LONG TERM GOAL #4   Title Patient will decrease Quick DASH score by > 8 points demonstrating reduced self-reported upper extremity disability.   Time 8   Period Weeks   Status Not Met               Plan - 06/03/16 0817    Clinical Impression Statement Patient instructed in advanced RUE shoulder strengthening for postural stability; she is able to demonstrate better posture and exercise correction requiring less cues; Patient does report increased fatigue with advanced exercise but denies any increase in pain; Patient would benefit from additional skilled PT intervention to improve strength, ROM and reduce shoulder pain with ADLs;    Rehab Potential Fair   Clinical Impairments Affecting Rehab Potential positive: young in age, motivated; negative: chronic condition, impaired posture; Patient's clinical presentation is evolving as she has started experiencing worsening of symptoms with numbness down RUE and progressing to pain and numbness down LUE shoulder;    PT Frequency 2x / week   PT Duration 8 weeks   PT Treatment/Interventions ADLs/Self Care Home Management;Cryotherapy;Electrical Stimulation;Iontophoresis '4mg'$ /ml Dexamethasone;Moist Heat;Ultrasound;Therapeutic exercise;Therapeutic activities;Patient/family education;Manual techniques;Taping;Energy conservation;Dry needling;Passive range of  motion   PT Next Visit Plan work on shoulder stabilization exercise;    PT Home Exercise Plan continue as given;    Consulted and Agree with Plan of Care Patient      Patient will benefit from skilled therapeutic intervention in order to improve the following deficits and impairments:  Hypomobility, Pain, Impaired UE functional use, Decreased strength, Improper body mechanics, Postural dysfunction, Impaired flexibility  Visit Diagnosis: Muscle weakness (generalized)  Chronic right shoulder pain  Abnormal posture     Problem List There are no active problems to display for this patient.   Trotter,Margaret PT, DPT 06/03/2016, 8:52 AM  [place] MAIN [facility] SERVICES [address] [place], [place], [address] Phone: [phone]   Fax:  [phone]  Name: Jenna [name] MRN: [id number] Date of Birth: 22-Dec-1965

## 2016-06-08 ENCOUNTER — Ambulatory Visit: Payer: 59 | Admitting: Physical Therapy

## 2016-06-10 ENCOUNTER — Ambulatory Visit: Payer: 59 | Admitting: Physical Therapy

## 2016-06-10 ENCOUNTER — Encounter: Payer: Self-pay | Admitting: Physical Therapy

## 2016-06-10 DIAGNOSIS — M6281 Muscle weakness (generalized): Secondary | ICD-10-CM

## 2016-06-10 DIAGNOSIS — M25511 Pain in right shoulder: Secondary | ICD-10-CM | POA: Diagnosis not present

## 2016-06-10 DIAGNOSIS — R293 Abnormal posture: Secondary | ICD-10-CM | POA: Diagnosis not present

## 2016-06-10 DIAGNOSIS — G8929 Other chronic pain: Secondary | ICD-10-CM | POA: Diagnosis not present

## 2016-06-10 NOTE — Therapy (Signed)
[facility] MAIN [facility] SERVICES [address] [place], [place], [address] Phone: [phone]   Fax:  [phone]  Physical Therapy Treatment  Patient Details  Name: Jenna [name] MRN: [id number] Date of Birth: Jun 12, 1965 Referring Provider: Charlotta Newton MD  Encounter Date: 06/10/2016      PT End of Session - 06/10/16 0821    Visit Number 12   Number of Visits 29   Date for PT Re-Evaluation 07/16/16   Authorization Type no gcodes   PT Start Time 0820   PT Stop Time 0840   PT Time Calculation (min) 20 min   Activity Tolerance Patient tolerated treatment well   Behavior During Therapy [facility] for tasks assessed/performed      History reviewed. No pertinent past medical history.  Past Surgical History:  Procedure Laterality Date  . CERVICAL CERCLAGE      There were no vitals filed for this visit.      Subjective Assessment - 06/10/16 0821    Subjective Pt arrived 20 minutes late to appointment and answered her phone twice during session, limiting session.  She requested to leave session early at 8:40 due to a conference call.  Pt reports she is completing her HEP regularly with no questions or concerns.  Is still having challenges with sleeping but this has much improved since starting therapy.    Pertinent History personal factors affecting rehab: chronic condition; impaired posture, continued work and caring for kids;    How long can you sit comfortably? 20-30 min;    How long can you stand comfortably? NA   How long can you walk comfortably? NA   Diagnostic tests none recent;    Patient Stated Goals relieve pain so that she can get back exercising;    Currently in Pain? No/denies   Pain Onset More than a month ago        TREATMENT  Patient standing:  Red tband shoulder extension x15  Red tband shoulder low row x15  Red tband shoulder ER 2x10  Patient required min-moderate verbal/tactile cues for correct exercise  technique including cues to isolate scapular movement and posterior shoulder muscle activation;   Shoulder protraction red tband RUE in sitting, x15 reps with mod VCs for positioning and to isolate scapular movement. Fatigue noted.   Push up plush on wall with demonstration and verbal cues to exaggerate plus x15   Maintaining push up plus while moving theraball on wall x8 up, down, right, and left. Pt reports significant fatigue with this but maintains proper form.            PT Education - 06/10/16 0821    Education provided Yes   Education Details Exercise technique, posture   Person(s) Educated Patient   Methods Explanation;Demonstration;Verbal cues   Comprehension Verbalized understanding;Returned demonstration;Need further instruction             PT Long Term Goals - 05/21/16 1322      PT LONG TERM GOAL #1   Title Patient will be independent in home exercise program to improve strength/mobility for better functional independence with ADLs.   Time 8   Period Weeks   Status On-going     PT LONG TERM GOAL #2   Title Patient will report a worst pain of 3/10 on VAS in  right shoulder           to improve tolerance with ADLs and reduced symptoms with activities.    Time 8   Period  Weeks   Status Not Met     PT LONG TERM GOAL #3   Title Patient will improve shoulder AROM to > 140 degrees of flexion, scaption, and abduction for improved ability to perform overhead activities   Time 8   Period Weeks   Status Not Met     PT LONG TERM GOAL #4   Title Patient will decrease Quick DASH score by > 8 points demonstrating reduced self-reported upper extremity disability.   Time 8   Period Weeks   Status Not Met               Plan - 06/10/16 0846    Clinical Impression Statement Session was very limited as pt late, took multiple phone calls during session, and had to leave early due to a conference call.  She demonstrated significant BUE fatigue with exercises  requiring scapular protraction therefore this was the main focus of today's session.  She will benefit from continued skilled PT interventions for improved posture, strength, and control in BUEs.   Rehab Potential Fair   Clinical Impairments Affecting Rehab Potential positive: young in age, motivated; negative: chronic condition, impaired posture; Patient's clinical presentation is evolving as she has started experiencing worsening of symptoms with numbness down RUE and progressing to pain and numbness down LUE shoulder;    PT Frequency 2x / week   PT Duration 8 weeks   PT Treatment/Interventions ADLs/Self Care Home Management;Cryotherapy;Electrical Stimulation;Iontophoresis '4mg'$ /ml Dexamethasone;Moist Heat;Ultrasound;Therapeutic exercise;Therapeutic activities;Patient/family education;Manual techniques;Taping;Energy conservation;Dry needling;Passive range of motion   PT Next Visit Plan work on shoulder stabilization exercise;    PT Home Exercise Plan continue as given;    Consulted and Agree with Plan of Care Patient      Patient will benefit from skilled therapeutic intervention in order to improve the following deficits and impairments:  Hypomobility, Pain, Impaired UE functional use, Decreased strength, Improper body mechanics, Postural dysfunction, Impaired flexibility  Visit Diagnosis: Muscle weakness (generalized)  Chronic right shoulder pain  Abnormal posture     Problem List There are no active problems to display for this patient.   Collie Siad PT, DPT 06/10/2016, 8:48 AM  [place] MAIN [facility] SERVICES [address] [place], [place], [address] Phone: [phone]   Fax:  [phone]  Name: Jenna [name] MRN: [id number] Date of Birth: June 25, 1965

## 2016-06-15 ENCOUNTER — Ambulatory Visit: Payer: 59 | Admitting: Physical Therapy

## 2016-06-15 ENCOUNTER — Encounter: Payer: Self-pay | Admitting: Physical Therapy

## 2016-06-15 DIAGNOSIS — G8929 Other chronic pain: Secondary | ICD-10-CM | POA: Diagnosis not present

## 2016-06-15 DIAGNOSIS — M25511 Pain in right shoulder: Secondary | ICD-10-CM

## 2016-06-15 DIAGNOSIS — R293 Abnormal posture: Secondary | ICD-10-CM | POA: Diagnosis not present

## 2016-06-15 DIAGNOSIS — M6281 Muscle weakness (generalized): Secondary | ICD-10-CM

## 2016-06-15 NOTE — Patient Instructions (Addendum)
Shoulder Horizontal Abduction / Adduction, Elbows Straight    Facing wall, have arms straight out to side, shoulders close to 90 degrees "T" position. Then squeeze shoulder blades together and bring arms slightly away from wall.   Repeat 10 times, do 2x a day;  Copyright  VHI. All rights reserved.

## 2016-06-15 NOTE — Therapy (Signed)
[place] MAIN [facility] SERVICES [address] [place], [place], [address] Phone: [phone]   Fax:  [phone]  Physical Therapy Treatment  Patient Details  Name: Jenna [name] MRN: [id number] Date of Birth: [birth year] Referring Provider: Blenda Nicely MD  Encounter Date: 06/15/2016      PT End of Session - 06/15/16 1054    Visit Number 13   Number of Visits 29   Date for PT Re-Evaluation 07/16/16   Authorization Type no gcodes   PT Start Time 1030   PT Stop Time 1102   PT Time Calculation (min) 32 min   Activity Tolerance Patient tolerated treatment well;No increased pain   Behavior During Therapy [facility] for tasks assessed/performed      History reviewed. No pertinent past medical history.  Past Surgical History:  Procedure Laterality Date  . CERVICAL CERCLAGE      There were no vitals filed for this visit.      Subjective Assessment - 06/15/16 1038    Subjective Patient reports continued pain in BUE shoulders at night with sleeping; Patient reports that she sleeps on her side with her arms tucked under her. She reports that is the only way she can sleep.    Pertinent History personal factors affecting rehab: chronic condition; impaired posture, continued work and caring for kids;    How long can you sit comfortably? 20-30 min;    How long can you stand comfortably? NA   How long can you walk comfortably? NA   Diagnostic tests none recent;    Patient Stated Goals relieve pain so that she can get back exercising;    Currently in Pain? Yes   Pain Score 2    Pain Location Shoulder   Pain Orientation Right   Pain Descriptors / Indicators Aching;Sore   Pain Type Chronic pain   Pain Onset More than a month ago   Pain Frequency Intermittent   Aggravating Factors  poor posture, sleeping;             [facility] PT Assessment - 06/15/16 0001      AROM   Right Shoulder Flexion 145 Degrees     TREATMENT: Warm up on UBE BUE,  backwards only level 1 x3 min (Unbilled)  Patient standing Green tband shoulder extension 2x10 Green tband shoulder low row 2x10 Green tband shoulder ER 2x10 Patient required min-moderate verbal/tactile cues for correct exercise techniqueincluding cues to isolate scapular movement and posterior shoulder muscle activation;   Facing wall: Low "V" to high  "V" with forearm on wall for scapular mobility x5 reps AROM with min VCs for positioning;  BUE horizontal abduction AROM x5 reps with cues for positioning to improve strengthening;   Patient in sidelying: RUE shoulder ER 2#, 2x15; Patient reports increased discomfort and fatigue with exercise; She required min VCs for positioning to improve shoulder strengthening;   Patient prone: RUE shoulder extension 2# x12; RUE shoulder low rows 2# x12 RUE shoulder mid row 2# x12; Patient required min-moderate verbal/tactile cues for correct exercise technique including positioning and to avoid shoulder IR;  Standing: Pushing down through table, walking backwards until stretch is felt x5 reps to facilitate inferior glide during shoulder flexion; BUE shoulder flexion AROM with elbow bent x5 reps;   Patient responded well to cues for correct exercise positioning; She reports less pain at end of session as compared to beginning of session; She also demonstrates significant improvement in shoulder ROM as evidenced above;  Patient re-educated  in sleeping posture; She reports that she has been sleeping more on her back. Instructed patient to try and keep palms up, when sleeping on her back to facilitate shoulder ER and reduce impingement;                            PT Education - 06/15/16 1054    Education provided Yes   Education Details exercise, technique, posture, strengthening;    Person(s) Educated Patient   Methods Explanation;Demonstration;Verbal cues   Comprehension Verbalized understanding;Returned  demonstration;Verbal cues required             PT Long Term Goals - 05/21/16 1322      PT LONG TERM GOAL #1   Title Patient will be independent in home exercise program to improve strength/mobility for better functional independence with ADLs.   Time 8   Period Weeks   Status On-going     PT LONG TERM GOAL #2   Title Patient will report a worst pain of 3/10 on VAS in  right shoulder           to improve tolerance with ADLs and reduced symptoms with activities.    Time 8   Period Weeks   Status Not Met     PT LONG TERM GOAL #3   Title Patient will improve shoulder AROM to > 140 degrees of flexion, scaption, and abduction for improved ability to perform overhead activities   Time 8   Period Weeks   Status Not Met     PT LONG TERM GOAL #4   Title Patient will decrease Quick DASH score by > 8 points demonstrating reduced self-reported upper extremity disability.   Time 8   Period Weeks   Status Not Met               Plan - 06/15/16 1323    Clinical Impression Statement Patient reports minimal pain in shoulder today; she does report increased soreness in shoulders at night during sleeping. Re-educated patient in sleeping posture with cues to avoid shoulder IR/adduction; Patient requires min VCs for correct exercise technique and positioning during resisted exercise. She reports no increase in right shoulder pain; Patient demonstrates better RUE shoulder ROM with flexion. She would benefit from additional skilled PT intervention to improve strength, posture and reduce pain with ADLs;    Rehab Potential Fair   Clinical Impairments Affecting Rehab Potential positive: young in age, motivated; negative: chronic condition, impaired posture; Patient's clinical presentation is evolving as she has started experiencing worsening of symptoms with numbness down RUE and progressing to pain and numbness down LUE shoulder;    PT Frequency 2x / week   PT Duration 8 weeks   PT  Treatment/Interventions ADLs/Self Care Home Management;Cryotherapy;Electrical Stimulation;Iontophoresis 60m/ml Dexamethasone;Moist Heat;Ultrasound;Therapeutic exercise;Therapeutic activities;Patient/family education;Manual techniques;Taping;Energy conservation;Dry needling;Passive range of motion   PT Next Visit Plan work on shoulder stabilization exercise;    PT Home Exercise Plan continue as given;    Consulted and Agree with Plan of Care Patient      Patient will benefit from skilled therapeutic intervention in order to improve the following deficits and impairments:  Hypomobility, Pain, Impaired UE functional use, Decreased strength, Improper body mechanics, Postural dysfunction, Impaired flexibility  Visit Diagnosis: Muscle weakness (generalized)  Chronic right shoulder pain  Abnormal posture     Problem List There are no active problems to display for this patient.   Trotter,Margaret PT, DPT 06/15/2016, 1:26 PM  [place] MAIN [facility] SERVICES [address] [place], [place], [address] Phone: [phone]   Fax:  [phone]  Name: CALLEIGH LAFONTANT MRN: [id number] Date of Birth: Dec 12, 1965

## 2016-06-17 ENCOUNTER — Ambulatory Visit: Payer: 59 | Admitting: Physical Therapy

## 2016-06-22 ENCOUNTER — Encounter: Payer: Self-pay | Admitting: Physical Therapy

## 2016-06-22 ENCOUNTER — Ambulatory Visit: Payer: 59 | Admitting: Physical Therapy

## 2016-06-22 DIAGNOSIS — G8929 Other chronic pain: Secondary | ICD-10-CM | POA: Diagnosis not present

## 2016-06-22 DIAGNOSIS — R293 Abnormal posture: Secondary | ICD-10-CM | POA: Diagnosis not present

## 2016-06-22 DIAGNOSIS — M25511 Pain in right shoulder: Secondary | ICD-10-CM

## 2016-06-22 DIAGNOSIS — M6281 Muscle weakness (generalized): Secondary | ICD-10-CM | POA: Diagnosis not present

## 2016-06-22 NOTE — Therapy (Signed)
[place] MAIN [facility] SERVICES [address] [place], [place], [address] Phone: [phone]   Fax:  [phone]  Physical Therapy Treatment  Patient Details  Name: Jenna [name] MRN: [id number] Date of Birth: 14-Aug-1965 Referring Provider: Blenda Nicely MD  Encounter Date: 06/22/2016      PT End of Session - 06/22/16 0827    Visit Number 14   Number of Visits 29   Date for PT Re-Evaluation 07/16/16   Authorization Type no gcodes   PT Start Time 0823   PT Stop Time 0853   PT Time Calculation (min) 30 min   Activity Tolerance Patient tolerated treatment well;No increased pain   Behavior During Therapy [facility] for tasks assessed/performed      History reviewed. No pertinent past medical history.  Past Surgical History:  Procedure Laterality Date  . CERVICAL CERCLAGE      There were no vitals filed for this visit.      Subjective Assessment - 06/22/16 0826    Subjective Patient reports that she feels like she is able to do more at the gym with less discomfort; She also reports still having some discomfort  with sleeping but reports that its not as bad as it was. Reports minimal pain this morning;    Pertinent History personal factors affecting rehab: chronic condition; impaired posture, continued work and caring for kids;    How long can you sit comfortably? 20-30 min;    How long can you stand comfortably? NA   How long can you walk comfortably? NA   Diagnostic tests none recent;    Patient Stated Goals relieve pain so that she can get back exercising;    Currently in Pain? Yes   Pain Score 1    Pain Location Shoulder   Pain Orientation Right   Pain Descriptors / Indicators Aching;Sore   Pain Type Chronic pain   Pain Onset More than a month ago   Pain Frequency Intermittent            [facility] PT Assessment - 06/22/16 0001      Observation/Other Assessments   Quick DASH  25% (the lower the score the less disability,  improved from initial eval Mar 17, 2016 which was 59%)      TREATMENT: Warm up on UBE BUE, backwards only level 2 x3 min (Unbilled)  Patient standing Green tband shoulder extension x15 Green tband shoulder low row x15 Green tband shoulder ER 2x15 Patient required min-moderate verbal/tactile cues for correct exercise techniqueincluding cues to isolate scapular movement and posterior shoulder muscle activation;   Facing wall: Low "V" to high "V" with forearm on wall for scapular mobility x5 reps AROM with min VCs for positioning;  BUE tall "V" on wall, alternate UE lift off wall x5 each; Reports no increase in pain with advanced exercise;   Patient in sidelying: RUE shoulder ER 2#, 2x15; Patient reports increased discomfort and fatigue with exercise; She required min VCs for positioning to improve shoulder strengthening;   Patient prone: RUE shoulder extension 2# x15; RUE shoulder low rows 2# x15 RUE shoulder mid row 2# x15; Patient required min-moderate verbal/tactile cues for correct exercise technique including positioning and to avoid shoulder IR;  Supine: RUE shoulder flexion with elbow bent 2# x10; RUE shoulder circles clockwise/counterclockwise 2# x10 each; RUE shoulder punch 2# x15 with cues to keep elbow straight for better scapular movement;    Patient responded well to cues for correct exercise positioning; She reports  less pain at end of session as compared to beginning of session;                           PT Education - 06/22/16 0827    Education provided Yes   Education Details exercise technique, posture, strengthening, HEP reinforced;    Person(s) Educated Patient   Methods Explanation;Demonstration;Verbal cues   Comprehension Verbalized understanding;Returned demonstration;Verbal cues required             PT Long Term Goals - 06/22/16 0847      PT LONG TERM GOAL #1   Title Patient will be independent in home exercise  program to improve strength/mobility for better functional independence with ADLs.   Time 8   Period Weeks   Status On-going     PT LONG TERM GOAL #2   Title Patient will report a worst pain of 3/10 on VAS in  right shoulder           to improve tolerance with ADLs and reduced symptoms with activities.    Time 8   Period Weeks   Status Partially Met     PT LONG TERM GOAL #3   Title Patient will improve shoulder AROM to > 140 degrees of flexion, scaption, and abduction for improved ability to perform overhead activities   Baseline inconsistent, shoulder flexion 145, but shoulder abduction still limited;    Time 8   Period Weeks   Status Partially Met     PT LONG TERM GOAL #4   Title Patient will decrease Quick DASH score by > 8 points demonstrating reduced self-reported upper extremity disability.   Time 8   Period Weeks   Status Achieved               Plan - 06/22/16 0831    Clinical Impression Statement Patient instructed in advanced UE strengthening exercise. Increased repetition for increased strengthening. She requires min VCs for correct exercise technique and posture with exercise. Patient reports fatigue but no increase in pain. she would benefit from additional skilled PT intervention to improve strength, and reduce pain with ADLs;    Rehab Potential Fair   Clinical Impairments Affecting Rehab Potential positive: young in age, motivated; negative: chronic condition, impaired posture; Patient's clinical presentation is evolving as she has started experiencing worsening of symptoms with numbness down RUE and progressing to pain and numbness down LUE shoulder;    PT Frequency 2x / week   PT Duration 8 weeks   PT Treatment/Interventions ADLs/Self Care Home Management;Cryotherapy;Electrical Stimulation;Iontophoresis 23m/ml Dexamethasone;Moist Heat;Ultrasound;Therapeutic exercise;Therapeutic activities;Patient/family education;Manual techniques;Taping;Energy conservation;Dry  needling;Passive range of motion   PT Next Visit Plan work on shoulder stabilization exercise;    PT Home Exercise Plan continue as given;    Consulted and Agree with Plan of Care Patient      Patient will benefit from skilled therapeutic intervention in order to improve the following deficits and impairments:  Hypomobility, Pain, Impaired UE functional use, Decreased strength, Improper body mechanics, Postural dysfunction, Impaired flexibility  Visit Diagnosis: Muscle weakness (generalized)  Chronic right shoulder pain  Abnormal posture     Problem List There are no active problems to display for this patient.   Trotter,Margaret PT, DPT 06/22/2016, 9:15 AM  [place] [facility] [address] [address]Phone: [phone]  Fax:  [phone] Name: Jenna POLAKMRN: [id number] of Birth: 1December 21, 1967

## 2016-06-24 ENCOUNTER — Ambulatory Visit: Payer: 59 | Admitting: Physical Therapy

## 2016-06-24 ENCOUNTER — Encounter: Payer: Self-pay | Admitting: Physical Therapy

## 2016-06-24 DIAGNOSIS — R293 Abnormal posture: Secondary | ICD-10-CM

## 2016-06-24 DIAGNOSIS — M6281 Muscle weakness (generalized): Secondary | ICD-10-CM | POA: Diagnosis not present

## 2016-06-24 DIAGNOSIS — G8929 Other chronic pain: Secondary | ICD-10-CM | POA: Diagnosis not present

## 2016-06-24 DIAGNOSIS — M25511 Pain in right shoulder: Secondary | ICD-10-CM

## 2016-06-24 NOTE — Therapy (Signed)
[place] [facility] SERVICES [address] [place], [place], [address] Phone: [phone]   Fax:  [phone]  Physical Therapy Treatment  Patient Details  Name: Jenna [name] MRN: [id number] Date of Birth: [birth year] Referring Provider: Blenda Nicely MD  Encounter Date: 06/24/2016      PT End of Session - 06/24/16 0827    Visit Number 15   Number of Visits 29   Date for PT Re-Evaluation 07/16/16   Authorization Type no gcodes   PT Start Time 0823   PT Stop Time 0902   PT Time Calculation (min) 39 min   Activity Tolerance Patient tolerated treatment well;No increased pain   Behavior During Therapy [facility] for tasks assessed/performed      History reviewed. No pertinent past medical history.  Past Surgical History:  Procedure Laterality Date  . CERVICAL CERCLAGE      There were no vitals filed for this visit.      Subjective Assessment - 06/24/16 0825    Subjective Patient reports doing well; she denies any shoulder pain currently; Reports sleeping well last night without pain or sleep disturbances; She reports that she is planning on riding her motorcycle this weekend;    Pertinent History personal factors affecting rehab: chronic condition; impaired posture, continued work and caring for kids;    How long can you sit comfortably? 20-30 min;    How long can you stand comfortably? NA   How long can you walk comfortably? NA   Diagnostic tests none recent;    Patient Stated Goals relieve pain so that she can get back exercising;    Currently in Pain? No/denies   Pain Onset More than a month ago          TREATMENT: Warm up on UBE BUE, backwards only level 2 x3 min (Unbilled)  HOIST low row plate #2, 7P82 with mod VCs to avoid shoulder elevation and isolate scapular retraction; HOIST lat pull down plate #2, 4M35 with min VCS for positioning to reduce discomfort and increase back strengthening;  Patient  standing Greentband shoulder extension x15 Green tband shoulder low row x15 Green tband shoulder ER x20 Patient required min-moderate verbal/tactile cues for correct exercise techniqueincluding cues to isolate scapular movement and posterior shoulder muscle activation;  Standing: Body blade oscillations neutral to forward flexion x5 reps, neutral to shoulder abduction (low V) x5 reps; Requires mod VCs for positioning and to avoid shoulder protraction with advanced exercise.   Facing wall: Low "V" to high "V" with forearm on wall for scapular mobility with yellow tband x5 reps AROM with min VCs for positioning;    Patient in sidelying: RUE shoulder ER 2#, 2x15; Patient reports increased discomfort and fatigue with exercise; She required min VCs for positioning to improve shoulder strengthening;   Patient prone: RUE shoulder extension 2# x15; RUE shoulder low rows 2# x15 RUE shoulder mid row 2# x15; Patient required min-moderate verbal/tactile cues for correct exercise technique including positioning and to avoid shoulder IR;   Patient responded well to cues for correct exercise positioning; She reports no pain at end of session; she does have some fatigue. Educated patient on use of ice to reduce inflammation with increased exercise;                            PT Education - 06/24/16 0826    Education provided Yes   Education Details exercise technique, HEP reinforced, posture,  strengthening;    Person(s) Educated Patient   Methods Explanation;Verbal cues;Demonstration   Comprehension Verbalized understanding;Returned demonstration;Verbal cues required             PT Long Term Goals - 06/22/16 0847      PT LONG TERM GOAL #1   Title Patient will be independent in home exercise program to improve strength/mobility for better functional independence with ADLs.   Time 8   Period Weeks   Status On-going     PT LONG TERM GOAL #2   Title  Patient will report a worst pain of 3/10 on VAS in  right shoulder           to improve tolerance with ADLs and reduced symptoms with activities.    Time 8   Period Weeks   Status Partially Met     PT LONG TERM GOAL #3   Title Patient will improve shoulder AROM to > 140 degrees of flexion, scaption, and abduction for improved ability to perform overhead activities   Baseline inconsistent, shoulder flexion 145, but shoulder abduction still limited;    Time 8   Period Weeks   Status Partially Met     PT LONG TERM GOAL #4   Title Patient will decrease Quick DASH score by > 8 points demonstrating reduced self-reported upper extremity disability.   Time 8   Period Weeks   Status Achieved               Plan - 06/24/16 0914    Clinical Impression Statement Patient instructed in advanced UE strengthening exercise. She requires cues for positioning and to inhibit upper trap activation during scapular retraction, particularly with low rows. Patient denies any increase in shoulder pain with exercise. She does report increased fatigue. Patient also exhibits increased difficulty with shoulder abduction due to weakness. Patient would benefit from additional skilled PT intervention to improve shoulder ROM/strength and reduce pain with ADLs;    Rehab Potential Fair   Clinical Impairments Affecting Rehab Potential positive: young in age, motivated; negative: chronic condition, impaired posture; Patient's clinical presentation is evolving as she has started experiencing worsening of symptoms with numbness down RUE and progressing to pain and numbness down LUE shoulder;    PT Frequency 2x / week   PT Duration 8 weeks   PT Treatment/Interventions ADLs/Self Care Home Management;Cryotherapy;Electrical Stimulation;Iontophoresis '4mg'$ /ml Dexamethasone;Moist Heat;Ultrasound;Therapeutic exercise;Therapeutic activities;Patient/family education;Manual techniques;Taping;Energy conservation;Dry needling;Passive  range of motion   PT Next Visit Plan work on shoulder stabilization exercise;    PT Home Exercise Plan continue as given;    Consulted and Agree with Plan of Care Patient      Patient will benefit from skilled therapeutic intervention in order to improve the following deficits and impairments:  Hypomobility, Pain, Impaired UE functional use, Decreased strength, Improper body mechanics, Postural dysfunction, Impaired flexibility  Visit Diagnosis: Muscle weakness (generalized)  Chronic right shoulder pain  Abnormal posture     Problem List There are no active problems to display for this patient.   Roxanne Panek PT, DPT 06/24/2016, 9:17 AM  [place] MAIN [facility] SERVICES [address] [place], [place], [address] Phone: [phone]   Fax:  [phone]  Name: SHAWNTEE MAINWARING MRN: [id number] Date of Birth: 20-Nov-1965

## 2016-06-29 ENCOUNTER — Ambulatory Visit: Payer: 59 | Admitting: Physical Therapy

## 2016-06-29 ENCOUNTER — Encounter: Payer: Self-pay | Admitting: Physical Therapy

## 2016-06-29 DIAGNOSIS — M6281 Muscle weakness (generalized): Secondary | ICD-10-CM

## 2016-06-29 DIAGNOSIS — G8929 Other chronic pain: Secondary | ICD-10-CM

## 2016-06-29 DIAGNOSIS — R293 Abnormal posture: Secondary | ICD-10-CM

## 2016-06-29 DIAGNOSIS — M25511 Pain in right shoulder: Secondary | ICD-10-CM

## 2016-06-29 NOTE — Therapy (Signed)
[place] MAIN [facility] SERVICES [address] [place], [place], [address] Phone: [phone]   Fax:  [phone]  Physical Therapy Treatment  Patient Details  Name: Jenna [name] MRN: [id number] Date of Birth: 01/30/1966 Referring Provider: Blenda Nicely MD  Encounter Date: 06/29/2016      PT End of Session - 06/29/16 0823    Visit Number 16   Number of Visits 29   Date for PT Re-Evaluation 07/16/16   Authorization Type no gcodes   PT Start Time 0818   PT Stop Time 0900   PT Time Calculation (min) 42 min   Activity Tolerance Patient tolerated treatment well;No increased pain   Behavior During Therapy [facility] for tasks assessed/performed      History reviewed. No pertinent past medical history.  Past Surgical History:  Procedure Laterality Date  . CERVICAL CERCLAGE      There were no vitals filed for this visit.      Subjective Assessment - 06/29/16 9381    Subjective Patient reports doing some motorcycle riding this weekend without much difficulty. She reports, "I didn't go as far as I ususally do, but the short distance felt fine." She denies any pain this morning;    Pertinent History personal factors affecting rehab: chronic condition; impaired posture, continued work and caring for kids;    How long can you sit comfortably? 20-30 min;    How long can you stand comfortably? NA   How long can you walk comfortably? NA   Diagnostic tests none recent;    Patient Stated Goals relieve pain so that she can get back exercising;    Currently in Pain? No/denies   Pain Onset More than a month ago          TREATMENT: Warm up on UBE BUE, backwards only level 2x3 min (Unbilled)  HOIST low row plate #2, W29, mid row plate #1 H37  with mod VCs to avoid shoulder elevation and isolate scapular retraction; HOIST lat pull down plate #2, 1I96 with min VCS for positioning to reduce discomfort and increase back  strengthening;  Standing: RUE UE ranger x3 min with shoulder motion up/down, side/side, circles clockwise/counterclockwise with min VCs to avoid painful ROM and to just stretch into comfort;   Standing: Body blade oscillations neutral to forward flexion x5 reps, neutral to shoulder abduction (low V) x5 reps; Requires mod VCs for positioning and to avoid shoulder protraction with advanced exercise.  Body blade oscillations shoulder press x5 reps;  Facing wall: Low "V" to high "V" with forearm on wall for scapular mobility with yellow tband x5 reps AROM with min VCs for positioning;  High "V" alternate UE lift off wall 1# x10 bilaterally;  Horizontal abduction 1# x10 with min Vcs to improve positioning for less discomfort;    Patient in sidelying: RUE shoulder ER 3#, 2x10; Patient reports increased discomfort and fatigue with exercise; She required min VCs for positioning to improve shoulder strengthening;   Patient prone: RUE shoulder extension 3# x10 RUE shoulder low rows 3# x10 RUE shoulder mid row 3# x10 Patient required min-moderate verbal/tactile cues for correct exercise technique including positioning and to avoid shoulder IR;   Patient responded well to cues for correct exercise positioning; She reports no pain at end of session; she does have some fatigue.                          PT Education - 06/29/16  0822    Education provided Yes   Education Details exercise technique, HEP reinforced, posture   Person(s) Educated Patient   Methods Explanation;Verbal cues   Comprehension Verbalized understanding;Returned demonstration;Verbal cues required             PT Long Term Goals - 06/22/16 0847      PT LONG TERM GOAL #1   Title Patient will be independent in home exercise program to improve strength/mobility for better functional independence with ADLs.   Time 8   Period Weeks   Status On-going     PT LONG TERM GOAL #2   Title  Patient will report a worst pain of 3/10 on VAS in  right shoulder           to improve tolerance with ADLs and reduced symptoms with activities.    Time 8   Period Weeks   Status Partially Met     PT LONG TERM GOAL #3   Title Patient will improve shoulder AROM to > 140 degrees of flexion, scaption, and abduction for improved ability to perform overhead activities   Baseline inconsistent, shoulder flexion 145, but shoulder abduction still limited;    Time 8   Period Weeks   Status Partially Met     PT LONG TERM GOAL #4   Title Patient will decrease Quick DASH score by > 8 points demonstrating reduced self-reported upper extremity disability.   Time 8   Period Weeks   Status Achieved               Plan - 06/29/16 0802    Clinical Impression Statement Patient instructed in advanced UE strengthening and ROM exercise. she was able to progress resistance with less discomfort. She also demonstrates improved ROM with less pain. Patient does have trouble with advanced stability exercise and continues to have weakness in shoulder abduction. She would benefit from additional skilled PT intervention to improve strength, balance and gait safety;    Rehab Potential Fair   Clinical Impairments Affecting Rehab Potential positive: young in age, motivated; negative: chronic condition, impaired posture; Patient's clinical presentation is evolving as she has started experiencing worsening of symptoms with numbness down RUE and progressing to pain and numbness down LUE shoulder;    PT Frequency 2x / week   PT Duration 8 weeks   PT Treatment/Interventions ADLs/Self Care Home Management;Cryotherapy;Electrical Stimulation;Iontophoresis '4mg'$ /ml Dexamethasone;Moist Heat;Ultrasound;Therapeutic exercise;Therapeutic activities;Patient/family education;Manual techniques;Taping;Energy conservation;Dry needling;Passive range of motion   PT Next Visit Plan work on shoulder stabilization exercise;    PT Home  Exercise Plan continue as given;    Consulted and Agree with Plan of Care Patient      Patient will benefit from skilled therapeutic intervention in order to improve the following deficits and impairments:  Hypomobility, Pain, Impaired UE functional use, Decreased strength, Improper body mechanics, Postural dysfunction, Impaired flexibility  Visit Diagnosis: Muscle weakness (generalized)  Chronic right shoulder pain  Abnormal posture     Problem List There are no active problems to display for this patient.   Graceland Wachter PT, DPT 06/29/2016, 8:56 AM  [place] MAIN [facility] SERVICES [address] [place], [place], [address] Phone: [phone]   Fax:  [phone]  Name: Jenna [name] MRN: [id number] Date of Birth: Sep 29, 1965

## 2016-07-01 ENCOUNTER — Ambulatory Visit: Payer: 59 | Admitting: Physical Therapy

## 2016-07-06 ENCOUNTER — Ambulatory Visit: Payer: 59 | Admitting: Physical Therapy

## 2016-07-07 ENCOUNTER — Ambulatory Visit: Payer: 59 | Attending: Family Medicine | Admitting: Physical Therapy

## 2016-07-08 ENCOUNTER — Encounter: Payer: 59 | Admitting: Physical Therapy

## 2016-07-14 ENCOUNTER — Ambulatory Visit: Payer: 59 | Admitting: Physical Therapy

## 2016-10-01 DIAGNOSIS — M79672 Pain in left foot: Secondary | ICD-10-CM | POA: Diagnosis not present

## 2016-10-01 DIAGNOSIS — M5031 Other cervical disc degeneration,  high cervical region: Secondary | ICD-10-CM | POA: Diagnosis not present

## 2016-10-01 DIAGNOSIS — M19042 Primary osteoarthritis, left hand: Secondary | ICD-10-CM | POA: Diagnosis not present

## 2016-10-01 DIAGNOSIS — M19041 Primary osteoarthritis, right hand: Secondary | ICD-10-CM | POA: Diagnosis not present

## 2016-10-01 DIAGNOSIS — M25511 Pain in right shoulder: Secondary | ICD-10-CM | POA: Diagnosis not present

## 2016-10-01 DIAGNOSIS — H524 Presbyopia: Secondary | ICD-10-CM | POA: Diagnosis not present

## 2016-10-01 DIAGNOSIS — E079 Disorder of thyroid, unspecified: Secondary | ICD-10-CM | POA: Diagnosis not present

## 2016-10-01 DIAGNOSIS — M5136 Other intervertebral disc degeneration, lumbar region: Secondary | ICD-10-CM | POA: Diagnosis not present

## 2016-10-01 DIAGNOSIS — M19072 Primary osteoarthritis, left ankle and foot: Secondary | ICD-10-CM | POA: Diagnosis not present

## 2016-10-01 DIAGNOSIS — M79642 Pain in left hand: Secondary | ICD-10-CM | POA: Diagnosis not present

## 2016-10-01 DIAGNOSIS — Z791 Long term (current) use of non-steroidal anti-inflammatories (NSAID): Secondary | ICD-10-CM | POA: Diagnosis not present

## 2016-10-01 DIAGNOSIS — M2550 Pain in unspecified joint: Secondary | ICD-10-CM | POA: Diagnosis not present

## 2016-10-01 DIAGNOSIS — H52222 Regular astigmatism, left eye: Secondary | ICD-10-CM | POA: Diagnosis not present

## 2016-10-01 DIAGNOSIS — M9971 Connective tissue and disc stenosis of intervertebral foramina of cervical region: Secondary | ICD-10-CM | POA: Diagnosis not present

## 2016-10-01 DIAGNOSIS — M4604 Spinal enthesopathy, thoracic region: Secondary | ICD-10-CM | POA: Diagnosis not present

## 2016-10-01 DIAGNOSIS — M2011 Hallux valgus (acquired), right foot: Secondary | ICD-10-CM | POA: Diagnosis not present

## 2016-10-01 DIAGNOSIS — M79641 Pain in right hand: Secondary | ICD-10-CM | POA: Diagnosis not present

## 2016-10-01 DIAGNOSIS — M2012 Hallux valgus (acquired), left foot: Secondary | ICD-10-CM | POA: Diagnosis not present

## 2016-10-01 DIAGNOSIS — L659 Nonscarring hair loss, unspecified: Secondary | ICD-10-CM | POA: Diagnosis not present

## 2016-10-01 DIAGNOSIS — M545 Low back pain: Secondary | ICD-10-CM | POA: Diagnosis not present

## 2016-10-01 DIAGNOSIS — M542 Cervicalgia: Secondary | ICD-10-CM | POA: Diagnosis not present

## 2016-10-01 DIAGNOSIS — M79671 Pain in right foot: Secondary | ICD-10-CM | POA: Diagnosis not present

## 2016-10-01 DIAGNOSIS — M47812 Spondylosis without myelopathy or radiculopathy, cervical region: Secondary | ICD-10-CM | POA: Diagnosis not present

## 2016-10-01 DIAGNOSIS — M25531 Pain in right wrist: Secondary | ICD-10-CM | POA: Diagnosis not present

## 2016-10-01 DIAGNOSIS — M7989 Other specified soft tissue disorders: Secondary | ICD-10-CM | POA: Diagnosis not present

## 2016-10-01 DIAGNOSIS — G8929 Other chronic pain: Secondary | ICD-10-CM | POA: Diagnosis not present

## 2016-10-01 DIAGNOSIS — H5213 Myopia, bilateral: Secondary | ICD-10-CM | POA: Diagnosis not present

## 2016-10-06 DIAGNOSIS — N289 Disorder of kidney and ureter, unspecified: Secondary | ICD-10-CM | POA: Insufficient documentation

## 2016-10-06 DIAGNOSIS — M2550 Pain in unspecified joint: Secondary | ICD-10-CM | POA: Diagnosis not present

## 2016-10-06 DIAGNOSIS — M25531 Pain in right wrist: Secondary | ICD-10-CM | POA: Diagnosis not present

## 2016-10-06 DIAGNOSIS — M5412 Radiculopathy, cervical region: Secondary | ICD-10-CM | POA: Insufficient documentation

## 2016-10-06 DIAGNOSIS — M7989 Other specified soft tissue disorders: Secondary | ICD-10-CM | POA: Diagnosis not present

## 2016-10-06 DIAGNOSIS — M79672 Pain in left foot: Secondary | ICD-10-CM | POA: Diagnosis not present

## 2016-10-06 DIAGNOSIS — M542 Cervicalgia: Secondary | ICD-10-CM | POA: Diagnosis not present

## 2016-10-06 DIAGNOSIS — M25511 Pain in right shoulder: Secondary | ICD-10-CM | POA: Diagnosis not present

## 2016-10-06 DIAGNOSIS — M79671 Pain in right foot: Secondary | ICD-10-CM | POA: Diagnosis not present

## 2016-10-06 DIAGNOSIS — E079 Disorder of thyroid, unspecified: Secondary | ICD-10-CM | POA: Diagnosis not present

## 2016-10-14 ENCOUNTER — Other Ambulatory Visit: Payer: Self-pay | Admitting: Rheumatology

## 2016-10-14 DIAGNOSIS — M5412 Radiculopathy, cervical region: Secondary | ICD-10-CM

## 2016-10-14 DIAGNOSIS — M5030 Other cervical disc degeneration, unspecified cervical region: Secondary | ICD-10-CM

## 2016-10-21 ENCOUNTER — Ambulatory Visit: Payer: 59

## 2016-10-22 ENCOUNTER — Ambulatory Visit
Admission: RE | Admit: 2016-10-22 | Discharge: 2016-10-22 | Disposition: A | Discharge status: Home / Self Care | Payer: 59 | Source: Ambulatory Visit | Attending: Rheumatology | Admitting: Rheumatology

## 2016-10-22 DIAGNOSIS — M5030 Other cervical disc degeneration, unspecified cervical region: Secondary | ICD-10-CM

## 2016-10-22 DIAGNOSIS — M4802 Spinal stenosis, cervical region: Secondary | ICD-10-CM | POA: Diagnosis not present

## 2016-10-22 DIAGNOSIS — M5412 Radiculopathy, cervical region: Secondary | ICD-10-CM | POA: Diagnosis present

## 2016-10-22 DIAGNOSIS — M4692 Unspecified inflammatory spondylopathy, cervical region: Secondary | ICD-10-CM | POA: Diagnosis not present

## 2016-10-22 DIAGNOSIS — M5021 Other cervical disc displacement,  high cervical region: Secondary | ICD-10-CM | POA: Insufficient documentation

## 2016-11-26 ENCOUNTER — Ambulatory Visit: Payer: 59 | Attending: Rheumatology | Admitting: Physical Therapy

## 2016-11-26 ENCOUNTER — Encounter: Payer: Self-pay | Admitting: Physical Therapy

## 2016-11-26 DIAGNOSIS — M6281 Muscle weakness (generalized): Secondary | ICD-10-CM | POA: Insufficient documentation

## 2016-11-26 DIAGNOSIS — R293 Abnormal posture: Secondary | ICD-10-CM | POA: Insufficient documentation

## 2016-11-26 DIAGNOSIS — M542 Cervicalgia: Secondary | ICD-10-CM | POA: Insufficient documentation

## 2016-11-26 DIAGNOSIS — M25511 Pain in right shoulder: Secondary | ICD-10-CM | POA: Insufficient documentation

## 2016-11-26 DIAGNOSIS — G8929 Other chronic pain: Secondary | ICD-10-CM | POA: Insufficient documentation

## 2016-11-26 DIAGNOSIS — M2560 Stiffness of unspecified joint, not elsewhere classified: Secondary | ICD-10-CM | POA: Diagnosis not present

## 2016-11-26 NOTE — Therapy (Signed)
[place] [facility] [address] [place], [place], [address] Phone: [phone]   Fax:  [phone]  Physical Therapy Evaluation  Patient Details  Name: MONESHA MONREAL MRN: [id number] Date of Birth: 1966-01-28 Referring Provider: Dr. Moreen Fowler  Encounter Date: 11/26/2016      PT End of Session - 11/26/16 0741    Visit Number 1   Number of Visits 8   Date for PT Re-Evaluation 12/24/16   Authorization Type no gcodes   PT Start Time 0838   PT Stop Time 0936   PT Time Calculation (min) 58 min   Activity Tolerance Patient tolerated treatment well;No increased pain   Behavior During Therapy [facility] for tasks assessed/performed      History reviewed. No pertinent past medical history.  Past Surgical History:  Procedure Laterality Date  . CERVICAL CERCLAGE      There were no vitals filed for this visit.       Subjective Assessment - 11/26/16 0739    Subjective Pt. reports 4/10 neck pain currently at rest.  See MRI report (moderate B foraminal stenosis/ mild broad-based disc bulge at C3-4).     Pertinent History personal factors affecting rehab: chronic condition; impaired posture, continued work and caring for kids;    Patient Stated Goals Increase pain-free cervical AROM/ B UE muscle strength.     Currently in Pain? Yes   Pain Score 4    Pain Location Neck   Pain Orientation Mid;Upper   Pain Descriptors / Indicators Aching   Pain Type Chronic pain            [facility] PT Assessment - 11/26/16 0001      Assessment   Medical Diagnosis Cervical pain   Referring Provider Dr. Moreen Fowler   Onset Date/Surgical Date 03/02/16   Hand Dominance Right   Prior Therapy yes for B shoulders (see notes)     Precautions   Precautions None     Balance Screen   Has the patient fallen in the past 6 months No     Prior Function   Level of Independence Independent     Observation/Other Assessments   Neck Disability Index  28%        Objective measurements completed on examination: See above findings.     See HEP       PT Education - 11/26/16 0857    Education provided Yes   Education Details Cervical stretches/ scap. retraction.   Person(s) Educated Patient   Methods Explanation;Verbal cues;Handout;Demonstration   Comprehension Verbalized understanding;Returned demonstration             PT Long Term Goals - 11/26/16 9470      PT LONG TERM GOAL #1   Title Patient will be independent in home exercise program to increase cervical AROM all planes of movement to improve strength/mobility for better functional independence with ADLs.   Baseline Pt. presents with functional cervical AROM: flexion/ extension WNL, L lat. flexion 40 deg./ R lat. flexion 44 deg., L rotn. 46 deg./ R rotn. 50 deg.  B UE muscle strength grossly 4+/5 MMT with guarding/ pain during resisted B sh. abd.      Time 4   Period Weeks   Status New     PT LONG TERM GOAL #2   Title Pt. will <2/10 neck pain at worst with work-related tasks/ lifting to improve pain-free mobility.     Baseline Pt. currently reports 4/10 neck pain at rest.  Moderate stiffness  Time 4   Period Weeks   Status New     PT LONG TERM GOAL #3   Title Pt. able to complete gym based exercise program at [employer] with no increase c/o neck pain/limitations.     Baseline pain limited with PF program.  Limited lifting   Time 4   Period Weeks   Status New     PT LONG TERM GOAL #4   Title Pt. will decrease NDI to <20% to improve self-perceived disability/ pain in cervical spine.     Baseline NDI 28% on 9/27   Time 4   Period Weeks   Status New            Plan - 11/26/16 1443    Clinical Impression Statement Pt. is a pleasant 51 y/o female with chronic h/o R shoulder/ neck pain.  Pt. reports 4/10 mid-neck pain at rest and moderate stiffness in neck/ R sh./ B hands/feet in the morning.  Pt. presents with functional cervical AROM: flexion/ extension  WNL, L lat. flexion 40 deg./ R lat. flexion 44 deg., L rotn. 46 deg./ R rotn. 50 deg.  B UE muscle strength grossly 4+/5 MMT with guarding/ pain during resisted B sh. abd.   Pt. presents with moderate B UT trigger points with palpation and increase symptoms with pressure.  L UT muscle tightness/ tenderness with R rotn/ lateral flexion stretches.   Moderate central/low cervical hypomobility with PA mobs. (grade II-III) with no reproduction of pain symptoms.  Good thoracic mobility and able to demonstrate proper upright seated posture.  NDI: 28% self-perceived mild disability.  Pt. will benefit from short-term skilled PT services to increase pain-free cervical AROM/ develop HEP to improve mobility.     History and Personal Factors relevant to plan of care: pt. 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  Primary seated work at [employer].     Clinical Presentation Stable   Clinical Decision Making Low   Rehab Potential Good   Clinical Impairments Affecting Rehab Potential positive: young in age, motivated; negative: chronic condition, impaired posture; Patient's clinical presentation is evolving as she has started experiencing worsening of symptoms with numbness down RUE and progressing to pain and numbness down LUE shoulder;    PT Frequency 2x / week   PT Duration 4 weeks   PT Treatment/Interventions ADLs/Self Care Home Management;Cryotherapy;Electrical Stimulation;Iontophoresis 4mg /ml Dexamethasone;Moist Heat;Ultrasound;Therapeutic exercise;Therapeutic activities;Patient/family education;Manual techniques;Taping;Energy conservation;Dry needling;Passive range of motion   PT Next Visit Plan Increase pain-free cervical ROM/ DRY NEEDLING to B UT trigger points.     PT Home Exercise Plan See handouts.  Discussed ice vs. heat.   Consulted and Agree with Plan of Care Patient      Patient will benefit from skilled therapeutic intervention in order to improve the following deficits and impairments:   Hypomobility, Pain, Impaired UE functional use, Decreased strength, Improper body mechanics, Postural dysfunction, Impaired flexibility, Decreased range of motion  Visit Diagnosis: Cervicalgia  Joint stiffness  Chronic right shoulder pain  Muscle weakness (generalized)  Abnormal posture     Problem List There are no active problems to display for this patient.  Pura Spice, PT, DPT # [id number] 11/26/2016, 9:27 AM  [place] [facility] [address] [place], [place], [address] Phone: [phone]   Fax:  [phone]  Name: ASHYRA CANTIN MRN: [id number] Date of Birth: October 27, 1965

## 2016-11-30 ENCOUNTER — Ambulatory Visit: Payer: 59 | Attending: Rheumatology | Admitting: Physical Therapy

## 2016-11-30 DIAGNOSIS — M25511 Pain in right shoulder: Secondary | ICD-10-CM | POA: Insufficient documentation

## 2016-11-30 DIAGNOSIS — G8929 Other chronic pain: Secondary | ICD-10-CM | POA: Diagnosis not present

## 2016-11-30 DIAGNOSIS — R293 Abnormal posture: Secondary | ICD-10-CM | POA: Insufficient documentation

## 2016-11-30 DIAGNOSIS — M2560 Stiffness of unspecified joint, not elsewhere classified: Secondary | ICD-10-CM | POA: Diagnosis not present

## 2016-11-30 DIAGNOSIS — M542 Cervicalgia: Secondary | ICD-10-CM | POA: Insufficient documentation

## 2016-11-30 DIAGNOSIS — M6281 Muscle weakness (generalized): Secondary | ICD-10-CM | POA: Insufficient documentation

## 2016-12-01 ENCOUNTER — Encounter: Payer: 59 | Admitting: Physical Therapy

## 2016-12-03 ENCOUNTER — Encounter: Payer: 59 | Admitting: Physical Therapy

## 2016-12-04 ENCOUNTER — Encounter: Payer: Self-pay | Admitting: Physical Therapy

## 2016-12-04 ENCOUNTER — Ambulatory Visit: Payer: 59 | Admitting: Physical Therapy

## 2016-12-04 DIAGNOSIS — R293 Abnormal posture: Secondary | ICD-10-CM

## 2016-12-04 DIAGNOSIS — M25511 Pain in right shoulder: Secondary | ICD-10-CM

## 2016-12-04 DIAGNOSIS — M2560 Stiffness of unspecified joint, not elsewhere classified: Secondary | ICD-10-CM

## 2016-12-04 DIAGNOSIS — M542 Cervicalgia: Secondary | ICD-10-CM | POA: Diagnosis not present

## 2016-12-04 DIAGNOSIS — G8929 Other chronic pain: Secondary | ICD-10-CM | POA: Diagnosis not present

## 2016-12-04 DIAGNOSIS — M6281 Muscle weakness (generalized): Secondary | ICD-10-CM | POA: Diagnosis not present

## 2016-12-04 NOTE — Therapy (Signed)
[place] [facility] [address] [place], [place], [address] Phone: [phone]   Fax:  [phone]  Physical Therapy Treatment  Patient Details  Name: Jenna [name] MRN: [id number] Date of Birth: [birth year] Referring Provider: Dr. Moreen Fowler  Encounter Date: 11/30/2016      PT End of Session - 12/04/16 0634    Visit Number 2   Number of Visits 8   Date for PT Re-Evaluation 12/24/16   Authorization Type no gcodes   PT Start Time 0734   PT Stop Time 0818   PT Time Calculation (min) 44 min   Activity Tolerance Patient tolerated treatment well;No increased pain   Behavior During Therapy [facility] for tasks assessed/performed      No past medical history on file.  Past Surgical History:  Procedure Laterality Date  . CERVICAL CERCLAGE      There were no vitals filed for this visit.      Subjective Assessment - 12/04/16 0634    Subjective Pt. states she is doing HEP.  Pt. c/o 3/10 B UT/ neck pain currently.     Pertinent History personal factors affecting rehab: chronic condition; impaired posture, continued work and caring for kids;    Patient Stated Goals Increase pain-free cervical AROM/ B UE muscle strength.     Currently in Pain? Yes   Pain Score 3    Pain Location Neck   Pain Orientation Mid;Lower   Pain Descriptors / Indicators Aching        Therex.:  Review of HEP  Manual tx.:  Supine L/R UT and levator manual stretches 4x each with static holds.  STM to cervical paraspinals/ B UT region in supine and prone position.  Cervical traction 3x with holds (no increase c/o pain).  Prone Grade II-III mobs. To T-T5 CPA/UPA 1x30 sec. Each.  Prone trigger point dry needling to L UT region (2 trigger points with 4 needles)- multiple muscle fasciculations noted.     Pt. Instructed to ice if soreness.        Pt. demonstrates good technique with cervical stretches with only feedback to hold stretch for 30 sec.  Moderate B  UT muscle tightness with multiple trigger points noted.  Pt. able to tolerate trigger point dry needling to L UT in prone position.  Several muscle fasciculations noted during dry needling.  Decrease L UT tendenress/ tightness noted after manual treatment.  Pt. instructed to contact PT if any increase c/o pain or soreness prior to next PT tx. session.            PT Long Term Goals - 11/26/16 3810      PT LONG TERM GOAL #1   Title Patient will be independent in home exercise program to increase cervical AROM all planes of movement to improve strength/mobility for better functional independence with ADLs.   Baseline Pt. presents with functional cervical AROM: flexion/ extension WNL, L lat. flexion 40 deg./ R lat. flexion 44 deg., L rotn. 46 deg./ R rotn. 50 deg.  B UE muscle strength grossly 4+/5 MMT with guarding/ pain during resisted B sh. abd.      Time 4   Period Weeks   Status New     PT LONG TERM GOAL #2   Title Pt. will <2/10 neck pain at worst with work-related tasks/ lifting to improve pain-free mobility.     Baseline Pt. currently reports 4/10 neck pain at rest.  Moderate stiffness   Time 4   Period  Weeks   Status New     PT LONG TERM GOAL #3   Title Pt. able to complete gym based exercise program at [employer] with no increase c/o neck pain/limitations.     Baseline pain limited with PF program.  Limited lifting   Time 4   Period Weeks   Status New     PT LONG TERM GOAL #4   Title Pt. will decrease NDI to <20% to improve self-perceived disability/ pain in cervical spine.     Baseline NDI 28% on 9/27   Time 4   Period Weeks   Status New       Patient will benefit from skilled therapeutic intervention in order to improve the following deficits and impairments:  Hypomobility, Pain, Impaired UE functional use, Decreased strength, Improper body mechanics, Postural dysfunction, Impaired flexibility, Decreased range of motion  Visit Diagnosis: Cervicalgia  Joint  stiffness  Chronic right shoulder pain  Muscle weakness (generalized)  Abnormal posture     Problem List There are no active problems to display for this patient.  Pura Spice, PT, DPT # [id number] 12/04/2016, 6:38 AM  [place] [facility] [address] [place], [place], [address] Phone: [phone]   Fax:  [phone]  Name: DOREATHA OFFER MRN: [id number] Date of Birth: 05-30-1965

## 2016-12-04 NOTE — Therapy (Signed)
[facility] [address] [place], [place], [address] Phone: [phone]   Fax:  [phone]  Physical Therapy Treatment  Patient Details  Name: Jenna [name] MRN: [id number] Date of Birth: [birth year] Referring Provider: Dr. Moreen Fowler  Encounter Date: 12/04/2016      PT End of Session - 12/04/16 0736    Visit Number 3   Number of Visits 8   Date for PT Re-Evaluation 12/24/16   Authorization Type no gcodes   PT Start Time 0728   PT Stop Time 0810   PT Time Calculation (min) 42 min   Activity Tolerance Patient tolerated treatment well;No increased pain   Behavior During Therapy [facility] for tasks assessed/performed      History reviewed. No pertinent past medical history.  Past Surgical History:  Procedure Laterality Date  . CERVICAL CERCLAGE      There were no vitals filed for this visit.      Subjective Assessment - 12/04/16 0728    Subjective Pt. reports that she was able to sleep on her L side without increased symptoms recently. Neck symptoms have primarily been R sided since last visit. Pt. reports good carryover with dry needling to L UT after last tx.     Pertinent History personal factors affecting rehab: chronic condition; impaired posture, continued work and caring for kids;    Patient Stated Goals Increase pain-free cervical AROM/ B UE muscle strength.     Currently in Pain? Yes   Pain Score 1    Pain Location Neck   Pain Orientation Mid;Lower   Pain Descriptors / Indicators Aching   Pain Type Chronic pain   Pain Onset More than a month ago   Pain Frequency Intermittent   Aggravating Factors  Prolonged sitting at computer   Pain Relieving Factors Rest   Effect of Pain on Daily Activities Decreased quality of life   Multiple Pain Sites No       Manual tx.:    Supine L/R UT and levator manual stretches 3x each with static holds x approx 30 sec.Supine manual traction. Soft tissue mobilization to B  upper trap. Passive shoulder flexion stretch x 30 sec each side (B shoulder stiffness noted).  Prone upper thoracic grade II-III PA mobs. 2x20 sec. Each.  Good mobility with no increase symptoms.     Prone trigger point dry needling to B UT region (2 trigger points, 1 R side, 1 L side with 5 needles)- less resistance with little to no fasciculation noted   Pt. Instructed to ice if soreness.          PT Education - 12/04/16 0731    Education provided Yes   Education Details Crvical stretches, ther-ex   Person(s) Educated Patient   Methods Explanation;Tactile cues;Verbal cues   Comprehension Verbalized understanding;Returned demonstration             PT Long Term Goals - 11/26/16 6962      PT LONG TERM GOAL #1   Title Patient will be independent in home exercise program to increase cervical AROM all planes of movement to improve strength/mobility for better functional independence with ADLs.   Baseline Pt. presents with functional cervical AROM: flexion/ extension WNL, L lat. flexion 40 deg./ R lat. flexion 44 deg., L rotn. 46 deg./ R rotn. 50 deg.  B UE muscle strength grossly 4+/5 MMT with guarding/ pain during resisted B sh. abd.      Time 4   Period Weeks  Status New     PT LONG TERM GOAL #2   Title Pt. will <2/10 neck pain at worst with work-related tasks/ lifting to improve pain-free mobility.     Baseline Pt. currently reports 4/10 neck pain at rest.  Moderate stiffness   Time 4   Period Weeks   Status New     PT LONG TERM GOAL #3   Title Pt. able to complete gym based exercise program at [employer] with no increase c/o neck pain/limitations.     Baseline pain limited with PF program.  Limited lifting   Time 4   Period Weeks   Status New     PT LONG TERM GOAL #4   Title Pt. will decrease NDI to <20% to improve self-perceived disability/ pain in cervical spine.     Baseline NDI 28% on 9/27   Time 4   Period Weeks   Status New             Plan -  12/04/16 0737    Clinical Impression Statement Pt. has had decreased cervical pain since beginning therapy. She continues to demonstrate good technique with HEP. Decreased cervical muscle tightness without guarding noted this visit. Pt had no increased pain with stretches in supine and no pain with transitions sit<>supine. Pt tolerated trigger point needling with minimal discomfort. On L upper traps pt had less fasciculation and decreased tissue tightness compared to last session. R upper traps trigger point was tighter with more tissue resistance compared to L side.   Clinical Presentation Stable   Clinical Decision Making Low   Rehab Potential Good   Clinical Impairments Affecting Rehab Potential positive: young in age, motivated; negative: chronic condition, impaired posture; Patient's clinical presentation is evolving as she has started experiencing worsening of symptoms with numbness down RUE and progressing to pain and numbness down LUE shoulder;    PT Frequency 2x / week   PT Duration 4 weeks   PT Treatment/Interventions ADLs/Self Care Home Management;Cryotherapy;Electrical Stimulation;Iontophoresis 4mg /ml Dexamethasone;Moist Heat;Ultrasound;Therapeutic exercise;Therapeutic activities;Patient/family education;Manual techniques;Taping;Energy conservation;Dry needling;Passive range of motion   PT Next Visit Plan Continue to increase pain-free cervical ROM/ DRY NEEDLING to B UT trigger points.  Progress to contract-relax cervical stretching   PT Home Exercise Plan See handouts.  Discussed ice vs. heat.   Consulted and Agree with Plan of Care Patient      Patient will benefit from skilled therapeutic intervention in order to improve the following deficits and impairments:  Hypomobility, Pain, Impaired UE functional use, Decreased strength, Improper body mechanics, Postural dysfunction, Impaired flexibility, Decreased range of motion  Visit Diagnosis: Cervicalgia  Joint stiffness  Chronic  right shoulder pain  Muscle weakness (generalized)  Abnormal posture     Problem List There are no active problems to display for this patient.   Eldonna Neuenfeldt, SPT Pura Spice, PT, DPT # [phone] 12/04/2016, 8:11 AM  [facility] [address] [place], [place], [address] Phone: [phone]   Fax:  [phone]  Name: Jenna [name] MRN: [id number] Date of Birth: October 02, 1965

## 2016-12-08 ENCOUNTER — Ambulatory Visit: Payer: 59 | Admitting: Physical Therapy

## 2016-12-08 DIAGNOSIS — M6281 Muscle weakness (generalized): Secondary | ICD-10-CM | POA: Diagnosis not present

## 2016-12-08 DIAGNOSIS — M542 Cervicalgia: Secondary | ICD-10-CM

## 2016-12-08 DIAGNOSIS — G8929 Other chronic pain: Secondary | ICD-10-CM

## 2016-12-08 DIAGNOSIS — M2560 Stiffness of unspecified joint, not elsewhere classified: Secondary | ICD-10-CM | POA: Diagnosis not present

## 2016-12-08 DIAGNOSIS — R293 Abnormal posture: Secondary | ICD-10-CM

## 2016-12-08 DIAGNOSIS — M25511 Pain in right shoulder: Secondary | ICD-10-CM

## 2016-12-10 ENCOUNTER — Encounter: Payer: Self-pay | Admitting: Physical Therapy

## 2016-12-10 ENCOUNTER — Ambulatory Visit: Payer: 59 | Admitting: Physical Therapy

## 2016-12-10 DIAGNOSIS — M25511 Pain in right shoulder: Secondary | ICD-10-CM

## 2016-12-10 DIAGNOSIS — M542 Cervicalgia: Secondary | ICD-10-CM

## 2016-12-10 DIAGNOSIS — M2560 Stiffness of unspecified joint, not elsewhere classified: Secondary | ICD-10-CM

## 2016-12-10 DIAGNOSIS — M6281 Muscle weakness (generalized): Secondary | ICD-10-CM

## 2016-12-10 DIAGNOSIS — R293 Abnormal posture: Secondary | ICD-10-CM | POA: Diagnosis not present

## 2016-12-10 DIAGNOSIS — G8929 Other chronic pain: Secondary | ICD-10-CM | POA: Diagnosis not present

## 2016-12-10 NOTE — Therapy (Addendum)
[facility] [address] [place], [place], [address] Phone: [phone]   Fax:  [phone]  Physical Therapy Treatment  Patient Details  Name: Jenna [name] MRN: [id number] Date of Birth: 1965/10/03 Referring Provider: Dr. Moreen Fowler  Encounter Date: 12/10/2016      PT End of Session - 12/10/16 1149    Visit Number 5   Number of Visits 8   Date for PT Re-Evaluation 12/24/16   Authorization Type no gcodes   PT Start Time 0810   PT Stop Time 0903   PT Time Calculation (min) 53 min   Activity Tolerance Patient tolerated treatment well;No increased pain   Behavior During Therapy [facility] for tasks assessed/performed      History reviewed. No pertinent past medical history.  Past Surgical History:  Procedure Laterality Date  . CERVICAL CERCLAGE      There were no vitals filed for this visit.      Subjective Assessment - 12/10/16 1148    Subjective Pt. reports feeling better since starting PT tx. session.  Pt. is still having difficulty getting head comfortable at night/ with pillow use.  [place] and will continue with HEP.     Pertinent History personal factors affecting rehab: chronic condition; impaired posture, continued work and caring for kids;    Patient Stated Goals Increase pain-free cervical AROM/ B UE muscle strength.     Currently in Pain? Yes   Pain Score 2    Pain Location Neck   Pain Orientation Mid;Lower   Pain Descriptors / Indicators Aching   Pain Type Chronic pain   Pain Onset More than a month ago        Manual tx.:              Supine L/R UT and levator manual stretches 3x each with static holds x approx 30 sec.Supine manual traction.Soft tissue mobilization to B upper trap. Passive shoulder flexion stretch x 30 sec each side (B shoulder stiffness noted).  Prone upper thoracic grade II-III PA mobs. 2x20 sec. Each.  Good mobility with no increase symptoms.             Prone trigger point dry needling to B UT region (2 trigger points, 1 R side, 1 L side with 5 needles)- STM to B UT in seated posture after dry needling.  No change to HEP at this time.     Pt. presents with increase cervical rotn. in seated posture after supine stretches/ prone trigger point dry needling.  No increase c/o neck pain during or after tx. with progress being made towards PT goals.         PT Long Term Goals - 11/26/16 0998      PT LONG TERM GOAL #1   Title Patient will be independent in home exercise program to increase cervical AROM all planes of movement to improve strength/mobility for better functional independence with ADLs.   Baseline Pt. presents with functional cervical AROM: flexion/ extension WNL, L lat. flexion 40 deg./ R lat. flexion 44 deg., L rotn. 46 deg./ R rotn. 50 deg.  B UE muscle strength grossly 4+/5 MMT with guarding/ pain during resisted B sh. abd.      Time 4   Period Weeks   Status New     PT LONG TERM GOAL #2   Title Pt. will <2/10 neck pain at worst with work-related tasks/ lifting to improve pain-free mobility.  Baseline Pt. currently reports 4/10 neck pain at rest.  Moderate stiffness   Time 4   Period Weeks   Status New     PT LONG TERM GOAL #3   Title Pt. able to complete gym based exercise program at [employer] with no increase c/o neck pain/limitations.     Baseline pain limited with PF program.  Limited lifting   Time 4   Period Weeks   Status New     PT LONG TERM GOAL #4   Title Pt. will decrease NDI to <20% to improve self-perceived disability/ pain in cervical spine.     Baseline NDI 28% on 9/27   Time 4   Period Weeks   Status New               Plan - 12/10/16 1150    Clinical Presentation Stable   Clinical Decision Making Low   Rehab Potential Good   Clinical Impairments Affecting Rehab Potential positive: young in age, motivated; negative: chronic condition, impaired posture; Patient's clinical  presentation is evolving as she has started experiencing worsening of symptoms with numbness down RUE and progressing to pain and numbness down LUE shoulder;    PT Frequency 2x / week   PT Duration 4 weeks   PT Treatment/Interventions ADLs/Self Care Home Management;Cryotherapy;Electrical Stimulation;Iontophoresis 4mg /ml Dexamethasone;Moist Heat;Ultrasound;Therapeutic exercise;Therapeutic activities;Patient/family education;Manual techniques;Taping;Energy conservation;Dry needling;Passive range of motion   PT Next Visit Plan Progress to contract-relax cervical stretching/ isometrics.     PT Home Exercise Plan See handouts.  Discussed ice vs. heat.   Consulted and Agree with Plan of Care Patient      Patient will benefit from skilled therapeutic intervention in order to improve the following deficits and impairments:  Hypomobility, Pain, Impaired UE functional use, Decreased strength, Improper body mechanics, Postural dysfunction, Impaired flexibility, Decreased range of motion  Visit Diagnosis: Cervicalgia  Joint stiffness  Chronic right shoulder pain  Muscle weakness (generalized)  Abnormal posture     Problem List There are no active problems to display for this patient.  Pura Spice, PT, DPT # [phone] 12/11/2016, 3:34 PM  [facility] [address] [place], [place], [address] Phone: [phone]   Fax:  [phone]  Name: Jenna [name] MRN: [id number] Date of Birth: 1965/11/01

## 2016-12-10 NOTE — Therapy (Signed)
[facility] [address] [place], [place], [address] Phone: [phone]   Fax:  [phone]  Physical Therapy Treatment  Patient Details  Name: Jenna [name] MRN: [id number] Date of Birth: 08-08-1965 Referring Provider: Dr. Moreen Fowler  Encounter Date: 12/08/2016      PT End of Session - 12/10/16 1141    Visit Number 4   Number of Visits 8   Date for PT Re-Evaluation 12/24/16   Authorization Type no gcodes   PT Start Time 9371   PT Stop Time 0819   PT Time Calculation (min) 44 min   Activity Tolerance Patient tolerated treatment well;No increased pain   Behavior During Therapy [facility] for tasks assessed/performed      No past medical history on file.  Past Surgical History:  Procedure Laterality Date  . CERVICAL CERCLAGE      There were no vitals filed for this visit.      Subjective Assessment - 12/10/16 1139    Subjective Pt. had an episode of sharp neck pain this morning while in bed.  Pt. states she is doing okay at this time.  Pt. going into work today.     Pertinent History personal factors affecting rehab: chronic condition; impaired posture, continued work and caring for kids;    Patient Stated Goals Increase pain-free cervical AROM/ B UE muscle strength.     Currently in Pain? Yes   Pain Score 1    Pain Location Neck   Pain Orientation Mid;Lower   Pain Descriptors / Indicators Aching   Pain Type Chronic pain         Manual tx.:              Supine L/R UT and levator manual stretches 3x each with static holds x approx 30 sec. Supine manual traction/ sub-occipital release technqiue 3x. Soft tissue mobilization to B upper trap. Passive shoulder flexion stretch x 30 sec each side (B shoulder stiffness noted).  Prone upper thoracic grade II-III PA mobs. 2x20 sec. Each.  Good mobility with no increase symptoms.                Prone trigger point dry needling to B UT region (2 trigger points, 1 R side, 1  L side with 5 needles)- less resistance with little to no fasciculation noted   Pt. Instructed to ice if soreness.         PT Long Term Goals - 11/26/16 6967      PT LONG TERM GOAL #1   Title Patient will be independent in home exercise program to increase cervical AROM all planes of movement to improve strength/mobility for better functional independence with ADLs.   Baseline Pt. presents with functional cervical AROM: flexion/ extension WNL, L lat. flexion 40 deg./ R lat. flexion 44 deg., L rotn. 46 deg./ R rotn. 50 deg.  B UE muscle strength grossly 4+/5 MMT with guarding/ pain during resisted B sh. abd.      Time 4   Period Weeks   Status New     PT LONG TERM GOAL #2   Title Pt. will <2/10 neck pain at worst with work-related tasks/ lifting to improve pain-free mobility.     Baseline Pt. currently reports 4/10 neck pain at rest.  Moderate stiffness   Time 4   Period Weeks   Status New     PT LONG TERM GOAL #3   Title Pt. able to complete gym based exercise program  at [employer] with no increase c/o neck pain/limitations.     Baseline pain limited with PF program.  Limited lifting   Time 4   Period Weeks   Status New     PT LONG TERM GOAL #4   Title Pt. will decrease NDI to <20% to improve self-perceived disability/ pain in cervical spine.     Baseline NDI 28% on 9/27   Time 4   Period Weeks   Status New            Plan - 12/10/16 1141    Clinical Impression Statement B UT trigger points present prior to manual tx./ dry needling.  Great tx. tolerance with deep trigger point release techniques/ dry needling to B UT region.  No increase c/o neck pain with decrease lower cervical paraspinal/ UT muscle tightness/ tenderness after tx. session.     Clinical Presentation Stable   Clinical Decision Making Low   Rehab Potential Good   Clinical Impairments Affecting Rehab Potential positive: young in age, motivated; negative: chronic condition, impaired posture;  Patient's clinical presentation is evolving as she has started experiencing worsening of symptoms with numbness down RUE and progressing to pain and numbness down LUE shoulder;    PT Frequency 2x / week   PT Duration 4 weeks   PT Treatment/Interventions ADLs/Self Care Home Management;Cryotherapy;Electrical Stimulation;Iontophoresis 4mg /ml Dexamethasone;Moist Heat;Ultrasound;Therapeutic exercise;Therapeutic activities;Patient/family education;Manual techniques;Taping;Energy conservation;Dry needling;Passive range of motion   PT Next Visit Plan Continue to increase pain-free cervical ROM/ DRY NEEDLING to B UT trigger points.  Progress to contract-relax cervical stretching   PT Home Exercise Plan See handouts.  Discussed ice vs. heat.   Consulted and Agree with Plan of Care Patient      Patient will benefit from skilled therapeutic intervention in order to improve the following deficits and impairments:  Hypomobility, Pain, Impaired UE functional use, Decreased strength, Improper body mechanics, Postural dysfunction, Impaired flexibility, Decreased range of motion  Visit Diagnosis: Cervicalgia  Joint stiffness  Chronic right shoulder pain  Muscle weakness (generalized)  Abnormal posture     Problem List There are no active problems to display for this patient.  Pura Spice, PT, DPT # [phone] 12/10/2016, 11:45 AM  [facility] [address] [place], [place], [address] Phone: [phone]   Fax:  [phone]  Name: Jenna [name] MRN: [id number] Date of Birth: 05/21/1965

## 2016-12-18 ENCOUNTER — Ambulatory Visit: Payer: 59 | Admitting: Physical Therapy

## 2016-12-18 DIAGNOSIS — M542 Cervicalgia: Secondary | ICD-10-CM

## 2016-12-18 DIAGNOSIS — M25511 Pain in right shoulder: Secondary | ICD-10-CM | POA: Diagnosis not present

## 2016-12-18 DIAGNOSIS — M2560 Stiffness of unspecified joint, not elsewhere classified: Secondary | ICD-10-CM

## 2016-12-18 DIAGNOSIS — G8929 Other chronic pain: Secondary | ICD-10-CM | POA: Diagnosis not present

## 2016-12-18 DIAGNOSIS — M6281 Muscle weakness (generalized): Secondary | ICD-10-CM

## 2016-12-18 DIAGNOSIS — R293 Abnormal posture: Secondary | ICD-10-CM | POA: Diagnosis not present

## 2016-12-19 NOTE — Therapy (Addendum)
[facility] [address] [place], [place], [address] Phone: [phone]   Fax:  [phone]  Physical Therapy Treatment  Patient Details  Name: Jenna [name] MRN: [id number] Date of Birth: 1965/11/24 Referring Provider: Dr. Moreen Fowler  Encounter Date: 12/18/2016      PT End of Session - 12/19/16 1420    Visit Number 6   Number of Visits 8   Date for PT Re-Evaluation 12/24/16   Authorization Type no gcodes   PT Start Time 0728   PT Stop Time 0817   PT Time Calculation (min) 49 min   Activity Tolerance Patient tolerated treatment well;No increased pain   Behavior During Therapy [facility] for tasks assessed/performed      No past medical history on file.  Past Surgical History:  Procedure Laterality Date  . CERVICAL CERCLAGE      There were no vitals filed for this visit.      Subjective Assessment - 12/19/16 1420    Pertinent History personal factors affecting rehab: chronic condition; impaired posture, continued work and caring for kids;    Patient Stated Goals Increase pain-free cervical AROM/ B UE muscle strength.     Currently in Pain? Yes   Pain Score 5    Pain Location Neck   Pain Orientation Mid;Lower   Pain Descriptors / Indicators Aching   Pain Type Chronic pain      Pt. states she is doing better with neck ROM but sleeping remains limited at night.  Pt. c/o L shoulder pain with overhead reaching.      NDI: 48%    Manual tx.:  Supine L/R UT and levator manual stretches 3x each with static holds x approx 30 sec.Supine manual traction.Soft tissue mobilization to B upper trap. Passive shoulder flexion stretch x 30 sec each side (B shoulder stiffness noted). Prone upper thoracic grade II-III PA mobs. 2x20 sec. Each. Good mobility with no increase symptoms. L shoulder assessment in supine/sitting.    Prone trigger point dry needling to BUT region (2 trigger points, 1 R  side, 1 L sidewith 5needles)- STM to B UT in seated posture after dry needling.  No change to HEP at this time.         Pt. presents with cervical joint stiffness this morning prior to manual tx. session but less pain/discomfort reported.  Pts. primary complaint is L shoulder with overhead reaching/ horizontal adduction (pain limited).  Minimal tenderness noted in B UT after tx session as compared to previous PT tx. sessions.         PT Long Term Goals - 11/26/16 4128      PT LONG TERM GOAL #1   Title Patient will be independent in home exercise program to increase cervical AROM all planes of movement to improve strength/mobility for better functional independence with ADLs.   Baseline Pt. presents with functional cervical AROM: flexion/ extension WNL, L lat. flexion 40 deg./ R lat. flexion 44 deg., L rotn. 46 deg./ R rotn. 50 deg.  B UE muscle strength grossly 4+/5 MMT with guarding/ pain during resisted B sh. abd.      Time 4   Period Weeks   Status New     PT LONG TERM GOAL #2   Title Pt. will <2/10 neck pain at worst with work-related tasks/ lifting to improve pain-free mobility.     Baseline Pt. currently reports 4/10 neck pain at rest.  Moderate stiffness   Time 4   Period  Weeks   Status New     PT LONG TERM GOAL #3   Title Pt. able to complete gym based exercise program at [employer] with no increase c/o neck pain/limitations.     Baseline pain limited with PF program.  Limited lifting   Time 4   Period Weeks   Status New     PT LONG TERM GOAL #4   Title Pt. will decrease NDI to <20% to improve self-perceived disability/ pain in cervical spine.     Baseline NDI 28% on 9/27   Time 4   Period Weeks   Status New               Plan - 12/19/16 1421    Clinical Presentation Stable   Clinical Decision Making Low   Rehab Potential Good   Clinical Impairments Affecting Rehab Potential positive: young in age, motivated; negative: chronic condition, impaired  posture; Patient's clinical presentation is evolving as she has started experiencing worsening of symptoms with numbness down RUE and progressing to pain and numbness down LUE shoulder;    PT Frequency 2x / week   PT Duration 4 weeks   PT Treatment/Interventions ADLs/Self Care Home Management;Cryotherapy;Electrical Stimulation;Iontophoresis 4mg /ml Dexamethasone;Moist Heat;Ultrasound;Therapeutic exercise;Therapeutic activities;Patient/family education;Manual techniques;Taping;Energy conservation;Dry needling;Passive range of motion   PT Next Visit Plan Progress to contract-relax cervical stretching/ isometrics.     PT Home Exercise Plan See handouts.  Discussed ice vs. heat.      Patient will benefit from skilled therapeutic intervention in order to improve the following deficits and impairments:  Hypomobility, Pain, Impaired UE functional use, Decreased strength, Improper body mechanics, Postural dysfunction, Impaired flexibility, Decreased range of motion  Visit Diagnosis: Cervicalgia  Joint stiffness  Chronic right shoulder pain  Muscle weakness (generalized)  Abnormal posture     Problem List There are no active problems to display for this patient.  Pura Spice, PT, DPT # [phone] 12/19/2016, 2:22 PM  [facility] [address] [place], [place], [address] Phone: [phone]   Fax:  [phone]  Name: Jenna [name] MRN: [id number] Date of Birth: 06/06/1965

## 2016-12-21 ENCOUNTER — Ambulatory Visit: Payer: 59 | Admitting: Physical Therapy

## 2016-12-21 DIAGNOSIS — M2560 Stiffness of unspecified joint, not elsewhere classified: Secondary | ICD-10-CM

## 2016-12-21 DIAGNOSIS — M542 Cervicalgia: Secondary | ICD-10-CM

## 2016-12-21 DIAGNOSIS — M6281 Muscle weakness (generalized): Secondary | ICD-10-CM

## 2016-12-21 DIAGNOSIS — R293 Abnormal posture: Secondary | ICD-10-CM

## 2016-12-29 ENCOUNTER — Ambulatory Visit: Payer: 59 | Admitting: Physical Therapy

## 2016-12-31 DIAGNOSIS — Z6827 Body mass index (BMI) 27.0-27.9, adult: Secondary | ICD-10-CM | POA: Diagnosis not present

## 2016-12-31 DIAGNOSIS — Z0000 Encounter for general adult medical examination without abnormal findings: Secondary | ICD-10-CM | POA: Diagnosis not present

## 2016-12-31 DIAGNOSIS — Z113 Encounter for screening for infections with a predominantly sexual mode of transmission: Secondary | ICD-10-CM | POA: Diagnosis not present

## 2017-01-01 DIAGNOSIS — Z1231 Encounter for screening mammogram for malignant neoplasm of breast: Secondary | ICD-10-CM | POA: Diagnosis not present

## 2017-01-01 DIAGNOSIS — Z0000 Encounter for general adult medical examination without abnormal findings: Secondary | ICD-10-CM | POA: Diagnosis not present

## 2017-01-04 ENCOUNTER — Encounter: Payer: Self-pay | Admitting: Physical Therapy

## 2017-01-04 ENCOUNTER — Ambulatory Visit: Payer: 59 | Attending: Rheumatology | Admitting: Physical Therapy

## 2017-01-04 DIAGNOSIS — M2560 Stiffness of unspecified joint, not elsewhere classified: Secondary | ICD-10-CM | POA: Insufficient documentation

## 2017-01-04 DIAGNOSIS — R293 Abnormal posture: Secondary | ICD-10-CM | POA: Insufficient documentation

## 2017-01-04 DIAGNOSIS — M542 Cervicalgia: Secondary | ICD-10-CM | POA: Diagnosis not present

## 2017-01-04 DIAGNOSIS — M25511 Pain in right shoulder: Secondary | ICD-10-CM | POA: Insufficient documentation

## 2017-01-04 DIAGNOSIS — G8929 Other chronic pain: Secondary | ICD-10-CM | POA: Insufficient documentation

## 2017-01-04 DIAGNOSIS — M6281 Muscle weakness (generalized): Secondary | ICD-10-CM | POA: Insufficient documentation

## 2017-01-04 NOTE — Therapy (Addendum)
[facility] [address] [place], [place], [address] Phone: [phone]   Fax:  [phone]  Physical Therapy Treatment  Patient Details  Name: Jenna [name] MRN: [id number] Date of Birth: 08-05-1965 Referring Provider: Dr. Moreen Fowler   Encounter Date: 01/04/2017    Treatment 7 of 15.  Recert date: 13/2/44   History reviewed. No pertinent past medical history.  Past Surgical History:  Procedure Laterality Date  . CERVICAL CERCLAGE      There were no vitals filed for this visit.    Pt reports that her pain in L superior shoulder has flared up since sleeping on her side. Her neck tightness continues to improve.     [facility] PT Assessment - 01/18/17 0001      Assessment   Medical Diagnosis  Cervical pain    Referring Provider  Dr. Moreen Fowler    Onset Date/Surgical Date  03/02/16      Treatment:   L shoulder re-screened:    Manual tx.:               Supine L/R UT and levator manual stretches 3x each with static holds x approx 30 sec.Supine manual traction. Soft tissue mobilization to B upper trap. Passive shoulder flexion stretch x 30 sec each side (B shoulder stiffness noted).  Prone upper thoracic grade II-III PA mobs. 2x20 sec. Each.  Good mobility with no increase symptoms.  Reviewed HEP in depth.  See new HEP handouts.                  Pt. presents with increase cervical rotn. in seated posture after supine stretches/ prone soft tissue mobs.  No increase c/o neck pain during or after tx. with progress being made towards PT goals.            Pt. has shown consistent improvement in cervical AROM/ postural strengthening in a pain-free/ tolerable range.  Decrease B UT/ levator muscle tenderness with palpation and manual tx. today.  L posterior shoulder discomfort over supraspinatus muscle region with palpation/ lying on L side.  Pts. shoulder pain has limited her ability to sleep at night and complete  overhead activities without pain.  Pt. will benefit from continued skilled PT services to increase B UE strengthen/ neck pain-free mobility.       PT Long Term Goals - 01/05/17 1047      PT LONG TERM GOAL #1   Title  Patient will be independent in home exercise program to increase cervical AROM all planes of movement to improve strength/mobility for better functional independence with ADLs.    Baseline  Pt. presents with functional cervical AROM: flexion/ extension WNL, L lat. flexion 45 deg./ R lat. flexion 44 deg., L rotn. 46 deg./ R rotn. 50 deg.  B UE muscle strength grossly 4+/5 MMT with guarding/ pain during resisted B sh. abd., esp. L flexion/abd.       Time  4    Period  Weeks    Status  Partially Met    Target Date  02/01/17      PT LONG TERM GOAL #2   Title  Pt. will <2/10 neck pain at worst with work-related tasks/ lifting to improve pain-free mobility.      Baseline  Increase neck discomfort with sleeping posture.  L shoulder pain with L sidelying.      Time  4    Period  Weeks    Status  Partially Met  Target Date  02/01/17      PT LONG TERM GOAL #3   Title  Pt. able to complete gym based exercise program at [employer] with no increase c/o neck pain/limitations.      Baseline  pain limited with PF program.  Limited lifting    Time  4    Period  Weeks    Status  On-going    Target Date  02/01/17      PT LONG TERM GOAL #4   Title  Pt. will decrease NDI to <20% to improve self-perceived disability/ pain in cervical spine.      Baseline  NDI 48% on 10/19    Time  4    Period  Weeks    Status  On-going    Target Date  02/01/17              Patient will benefit from skilled therapeutic intervention in order to improve the following deficits and impairments:  Hypomobility, Pain, Impaired UE functional use, Decreased strength, Improper body mechanics, Postural dysfunction, Impaired flexibility, Decreased range of motion  Visit  Diagnosis: Cervicalgia  Joint stiffness  Chronic right shoulder pain  Abnormal posture  Muscle weakness (generalized)     Problem List There are no active problems to display for this patient.  Pura Spice, PT, DPT # [phone] 01/18/2017, 10:52 AM  [place] [facility] [address] [place], [place], [address] Phone: [phone]   Fax:  [phone]  Name: ABILENE MCPHEE MRN: [id number] Date of Birth: 09/08/1965

## 2017-01-07 ENCOUNTER — Encounter: Payer: 59 | Admitting: Physical Therapy

## 2017-01-11 ENCOUNTER — Ambulatory Visit: Payer: 59 | Admitting: Physical Therapy

## 2017-01-11 DIAGNOSIS — R293 Abnormal posture: Secondary | ICD-10-CM | POA: Diagnosis not present

## 2017-01-11 DIAGNOSIS — M6281 Muscle weakness (generalized): Secondary | ICD-10-CM | POA: Diagnosis not present

## 2017-01-11 DIAGNOSIS — M542 Cervicalgia: Secondary | ICD-10-CM | POA: Diagnosis not present

## 2017-01-11 DIAGNOSIS — M2560 Stiffness of unspecified joint, not elsewhere classified: Secondary | ICD-10-CM

## 2017-01-11 DIAGNOSIS — M25511 Pain in right shoulder: Secondary | ICD-10-CM | POA: Diagnosis not present

## 2017-01-11 DIAGNOSIS — G8929 Other chronic pain: Secondary | ICD-10-CM | POA: Diagnosis not present

## 2017-01-11 NOTE — Therapy (Signed)
[place] [facility] [address] [place], [place], [address] Phone: [phone]   Fax:  [phone]  Physical Therapy Treatment  Patient Details  Name: LISSET KETCHEM MRN: [id number] Date of Birth: 1965/11/23 Referring Provider: Dr. Moreen Fowler   Encounter Date: 12/21/2016    No past medical history on file.  Past Surgical History:  Procedure Laterality Date  . CERVICAL CERCLAGE      There were no vitals filed for this visit.       No tx. Today.  Goals reassessed prior to pt. Going to work.      PT Long Term Goals - 12/21/16 1848      PT LONG TERM GOAL #1   Title  Patient will be independent in home exercise program to increase cervical AROM all planes of movement to improve strength/mobility for better functional independence with ADLs.    Baseline  Pt. presents with functional cervical AROM: flexion/ extension WNL, L lat. flexion 40 deg./ R lat. flexion 44 deg., L rotn. 46 deg./ R rotn. 50 deg.  B UE muscle strength grossly 4+/5 MMT with guarding/ pain during resisted B sh. abd.       Time  4    Period  Weeks    Status  Partially Met      PT LONG TERM GOAL #2   Title  Pt. will <2/10 neck pain at worst with work-related tasks/ lifting to improve pain-free mobility.      Baseline  Increase neck discomfort with sleeping posture.      Time  4    Period  Weeks    Status  Partially Met      PT LONG TERM GOAL #3   Title  Pt. able to complete gym based exercise program at [employer] with no increase c/o neck pain/limitations.      Time  4    Period  Weeks    Status  On-going      PT LONG TERM GOAL #4   Title  Pt. will decrease NDI to <20% to improve self-perceived disability/ pain in cervical spine.      Baseline  NDI 48% on 10/19    Time  4    Period  Weeks    Status  Not Met              Patient will benefit from skilled therapeutic intervention in order to improve the following deficits and  impairments:     Visit Diagnosis: Cervicalgia  Joint stiffness  Muscle weakness (generalized)  Abnormal posture     Problem List There are no active problems to display for this patient.  Pura Spice, PT, DPT # [phone] 01/11/2017, 7:55 AM  [place] [facility] [address] [place], [place], [address] Phone: [phone]   Fax:  [phone]  Name: FAVIOLA KLARE MRN: [id number] Date of Birth: [birth year]

## 2017-01-14 ENCOUNTER — Ambulatory Visit: Payer: 59 | Admitting: Physical Therapy

## 2017-01-15 NOTE — Therapy (Addendum)
[place] [facility] [address] [place], [place], [address] Phone: [phone]   Fax:  [phone]  Physical Therapy Treatment  Patient Details  Name: Jenna [name] MRN: [id number] Date of Birth: 1965-11-24 Referring Provider: Dr. Moreen Fowler   Encounter Date: 01/11/2017    PT End of Session - 01/15/17 1348    Visit Number  8    Number of Visits  15    Date for PT Re-Evaluation  02/01/17    Authorization Type  no gcodes    PT Start Time  0734    PT Stop Time  0823    PT Time Calculation (min)  49 min    Activity Tolerance  Patient tolerated treatment well;Patient limited by pain    Behavior During Therapy  [facility] for tasks assessed/performed     No past medical history on file.  Past Surgical History:  Procedure Laterality Date  . CERVICAL CERCLAGE      There were no vitals filed for this visit.      [facility] PT Assessment - 01/18/17 0001      Assessment   Medical Diagnosis  Cervical pain    Referring Provider  Dr. Moreen Fowler    Onset Date/Surgical Date  03/02/16         Pt. reports no pain in the neck and 1/10 L shoulder discomfort currently at rest.  Pt. reports compliance with HEP.       Treatment:  There.ex.:  Reviewed HEP.  Supine L shoulder serratus punches/ AROM all planes (pain tolerable range).  Manual tx.:  Supine cervical traction 4x with 20+ sec. Holds. (as tolerated).  Supine UT/levator stretches L/R 3x with holds as tolerated (no increase c/o pain) with increase cervical lateral flexion noted.  Manual cervical isometrics (all planes)- 4x each (min.-moderate resistance).  Prone cervical/ upper thoracic grade II-III PA mobs. 2x20 sec.  Supine L shoulder capsular reassessment.            Pt. demonstrates good technique with cervical stretches and new HEP exercise program.  Pt. continues to have pain in L posterior shoulder with palpation/ position changes on mat table.  Slight L shoulder  capsule hypomobility.        PT Long Term Goals - 01/05/17 1047      PT LONG TERM GOAL #1   Title  Patient will be independent in home exercise program to increase cervical AROM all planes of movement to improve strength/mobility for better functional independence with ADLs.    Baseline  Pt. presents with functional cervical AROM: flexion/ extension WNL, L lat. flexion 45 deg./ R lat. flexion 44 deg., L rotn. 46 deg./ R rotn. 50 deg.  B UE muscle strength grossly 4+/5 MMT with guarding/ pain during resisted B sh. abd., esp. L flexion/abd.       Time  4    Period  Weeks    Status  Partially Met    Target Date  02/01/17      PT LONG TERM GOAL #2   Title  Pt. will <2/10 neck pain at worst with work-related tasks/ lifting to improve pain-free mobility.      Baseline  Increase neck discomfort with sleeping posture.  L shoulder pain with L sidelying.      Time  4    Period  Weeks    Status  Partially Met    Target Date  02/01/17      PT LONG TERM GOAL #3   Title  Pt. able to complete gym based exercise program at [employer] with no increase c/o neck pain/limitations.      Baseline  pain limited with PF program.  Limited lifting    Time  4    Period  Weeks    Status  On-going    Target Date  02/01/17      PT LONG TERM GOAL #4   Title  Pt. will decrease NDI to <20% to improve self-perceived disability/ pain in cervical spine.      Baseline  NDI 48% on 10/19    Time  4    Period  Weeks    Status  On-going    Target Date  02/01/17              Patient will benefit from skilled therapeutic intervention in order to improve the following deficits and impairments:  Hypomobility, Pain, Impaired UE functional use, Decreased strength, Improper body mechanics, Postural dysfunction, Impaired flexibility, Decreased range of motion  Visit Diagnosis: Joint stiffness  Cervicalgia     Problem List There are no active problems to display for this patient.  Pura Spice,  PT, DPT # [phone] 01/18/2017, 11:39 AM  [place] [address] [place], [place], [address] Phone: [phone]   Fax:  [phone]  Name: Jenna [name] MRN: [id number] Date of Birth: January 21, 1966

## 2017-01-18 ENCOUNTER — Encounter: Payer: 59 | Admitting: Physical Therapy

## 2017-01-18 NOTE — Addendum Note (Signed)
Addended by: Dorcas Carrow C on: 01/18/2017 10:55 AM   Modules accepted: Orders

## 2017-02-02 ENCOUNTER — Encounter: Payer: 59 | Admitting: Physical Therapy

## 2017-02-02 DIAGNOSIS — N939 Abnormal uterine and vaginal bleeding, unspecified: Secondary | ICD-10-CM | POA: Diagnosis not present

## 2017-02-02 DIAGNOSIS — Z6828 Body mass index (BMI) 28.0-28.9, adult: Secondary | ICD-10-CM | POA: Diagnosis not present

## 2017-02-02 DIAGNOSIS — G8929 Other chronic pain: Secondary | ICD-10-CM | POA: Diagnosis not present

## 2017-02-02 DIAGNOSIS — M25512 Pain in left shoulder: Secondary | ICD-10-CM | POA: Diagnosis not present

## 2017-03-03 DIAGNOSIS — M542 Cervicalgia: Secondary | ICD-10-CM | POA: Diagnosis not present

## 2017-03-03 DIAGNOSIS — E079 Disorder of thyroid, unspecified: Secondary | ICD-10-CM | POA: Diagnosis not present

## 2017-03-03 DIAGNOSIS — M2550 Pain in unspecified joint: Secondary | ICD-10-CM | POA: Diagnosis not present

## 2017-03-03 DIAGNOSIS — M79671 Pain in right foot: Secondary | ICD-10-CM | POA: Diagnosis not present

## 2017-03-03 DIAGNOSIS — G8929 Other chronic pain: Secondary | ICD-10-CM | POA: Diagnosis not present

## 2017-03-03 DIAGNOSIS — M25512 Pain in left shoulder: Secondary | ICD-10-CM | POA: Diagnosis not present

## 2017-03-03 DIAGNOSIS — M25531 Pain in right wrist: Secondary | ICD-10-CM | POA: Diagnosis not present

## 2017-03-03 DIAGNOSIS — M7989 Other specified soft tissue disorders: Secondary | ICD-10-CM | POA: Diagnosis not present

## 2017-03-03 DIAGNOSIS — M25511 Pain in right shoulder: Secondary | ICD-10-CM | POA: Diagnosis not present

## 2017-03-03 DIAGNOSIS — M79672 Pain in left foot: Secondary | ICD-10-CM | POA: Diagnosis not present

## 2017-03-03 DIAGNOSIS — M19012 Primary osteoarthritis, left shoulder: Secondary | ICD-10-CM | POA: Diagnosis not present

## 2017-03-03 DIAGNOSIS — M5030 Other cervical disc degeneration, unspecified cervical region: Secondary | ICD-10-CM | POA: Diagnosis not present

## 2017-03-17 DIAGNOSIS — Z6827 Body mass index (BMI) 27.0-27.9, adult: Secondary | ICD-10-CM | POA: Diagnosis not present

## 2017-03-17 DIAGNOSIS — G8929 Other chronic pain: Secondary | ICD-10-CM | POA: Diagnosis not present

## 2017-03-17 DIAGNOSIS — M5030 Other cervical disc degeneration, unspecified cervical region: Secondary | ICD-10-CM | POA: Diagnosis not present

## 2017-03-17 DIAGNOSIS — M7989 Other specified soft tissue disorders: Secondary | ICD-10-CM | POA: Diagnosis not present

## 2017-03-17 DIAGNOSIS — M7542 Impingement syndrome of left shoulder: Secondary | ICD-10-CM | POA: Diagnosis not present

## 2017-03-17 DIAGNOSIS — M25511 Pain in right shoulder: Secondary | ICD-10-CM | POA: Diagnosis not present

## 2017-03-17 DIAGNOSIS — M2550 Pain in unspecified joint: Secondary | ICD-10-CM | POA: Diagnosis not present

## 2017-05-19 ENCOUNTER — Other Ambulatory Visit: Payer: Self-pay | Admitting: Nurse Practitioner

## 2017-05-19 ENCOUNTER — Other Ambulatory Visit (HOSPITAL_COMMUNITY): Payer: Self-pay | Admitting: Nurse Practitioner

## 2017-05-19 DIAGNOSIS — M5030 Other cervical disc degeneration, unspecified cervical region: Secondary | ICD-10-CM | POA: Diagnosis not present

## 2017-05-19 DIAGNOSIS — M7582 Other shoulder lesions, left shoulder: Secondary | ICD-10-CM

## 2017-05-19 DIAGNOSIS — Z6827 Body mass index (BMI) 27.0-27.9, adult: Secondary | ICD-10-CM | POA: Diagnosis not present

## 2017-05-25 ENCOUNTER — Other Ambulatory Visit: Payer: Self-pay | Admitting: Orthopedic Surgery

## 2017-05-25 DIAGNOSIS — G8929 Other chronic pain: Secondary | ICD-10-CM

## 2017-05-25 DIAGNOSIS — M25511 Pain in right shoulder: Principal | ICD-10-CM

## 2017-05-28 ENCOUNTER — Ambulatory Visit: Payer: 59

## 2017-06-01 ENCOUNTER — Other Ambulatory Visit (HOSPITAL_COMMUNITY): Payer: Self-pay | Admitting: Nurse Practitioner

## 2017-06-01 ENCOUNTER — Other Ambulatory Visit (HOSPITAL_COMMUNITY): Payer: Self-pay | Admitting: Orthopedic Surgery

## 2017-06-01 ENCOUNTER — Other Ambulatory Visit: Payer: Self-pay | Admitting: Nurse Practitioner

## 2017-06-01 ENCOUNTER — Ambulatory Visit: Payer: 59

## 2017-06-01 ENCOUNTER — Other Ambulatory Visit: Payer: Self-pay | Admitting: Orthopedic Surgery

## 2017-06-01 ENCOUNTER — Ambulatory Visit: Admission: RE | Admit: 2017-06-01 | Payer: 59 | Source: Ambulatory Visit

## 2017-06-01 DIAGNOSIS — M25511 Pain in right shoulder: Principal | ICD-10-CM

## 2017-06-01 DIAGNOSIS — G8929 Other chronic pain: Secondary | ICD-10-CM

## 2017-06-01 DIAGNOSIS — M7582 Other shoulder lesions, left shoulder: Secondary | ICD-10-CM

## 2017-06-04 ENCOUNTER — Ambulatory Visit (HOSPITAL_COMMUNITY)
Admission: RE | Admit: 2017-06-04 | Discharge: 2017-06-04 | Disposition: A | Discharge status: Home / Self Care | Payer: 59 | Source: Ambulatory Visit | Attending: Orthopedic Surgery | Admitting: Orthopedic Surgery

## 2017-06-04 ENCOUNTER — Ambulatory Visit (HOSPITAL_COMMUNITY)
Admission: RE | Admit: 2017-06-04 | Discharge: 2017-06-04 | Disposition: A | Discharge status: Home / Self Care | Payer: 59 | Source: Ambulatory Visit | Attending: Rheumatology | Admitting: Rheumatology

## 2017-06-04 DIAGNOSIS — S43491A Other sprain of right shoulder joint, initial encounter: Secondary | ICD-10-CM | POA: Insufficient documentation

## 2017-06-04 DIAGNOSIS — M25511 Pain in right shoulder: Secondary | ICD-10-CM

## 2017-06-04 DIAGNOSIS — M19011 Primary osteoarthritis, right shoulder: Secondary | ICD-10-CM | POA: Diagnosis not present

## 2017-06-04 DIAGNOSIS — M7552 Bursitis of left shoulder: Secondary | ICD-10-CM | POA: Insufficient documentation

## 2017-06-04 DIAGNOSIS — M7582 Other shoulder lesions, left shoulder: Secondary | ICD-10-CM

## 2017-06-04 DIAGNOSIS — G8929 Other chronic pain: Secondary | ICD-10-CM | POA: Diagnosis not present

## 2017-06-04 DIAGNOSIS — M7551 Bursitis of right shoulder: Secondary | ICD-10-CM | POA: Diagnosis not present

## 2017-06-04 DIAGNOSIS — X58XXXA Exposure to other specified factors, initial encounter: Secondary | ICD-10-CM | POA: Diagnosis not present

## 2017-06-04 DIAGNOSIS — M75102 Unspecified rotator cuff tear or rupture of left shoulder, not specified as traumatic: Secondary | ICD-10-CM | POA: Diagnosis not present

## 2017-06-10 DIAGNOSIS — Z6828 Body mass index (BMI) 28.0-28.9, adult: Secondary | ICD-10-CM | POA: Diagnosis not present

## 2017-06-10 DIAGNOSIS — M7582 Other shoulder lesions, left shoulder: Secondary | ICD-10-CM | POA: Diagnosis not present

## 2017-06-10 DIAGNOSIS — M7581 Other shoulder lesions, right shoulder: Secondary | ICD-10-CM | POA: Diagnosis not present

## 2017-08-16 ENCOUNTER — Ambulatory Visit
Admission: EM | Admit: 2017-08-16 | Discharge: 2017-08-16 | Disposition: A | Discharge status: Home / Self Care | Payer: 59 | Attending: Family Medicine | Admitting: Family Medicine

## 2017-08-16 ENCOUNTER — Other Ambulatory Visit: Payer: Self-pay

## 2017-08-16 DIAGNOSIS — R109 Unspecified abdominal pain: Secondary | ICD-10-CM

## 2017-08-16 DIAGNOSIS — R197 Diarrhea, unspecified: Secondary | ICD-10-CM | POA: Diagnosis not present

## 2017-08-16 DIAGNOSIS — M791 Myalgia, unspecified site: Secondary | ICD-10-CM

## 2017-08-16 DIAGNOSIS — R112 Nausea with vomiting, unspecified: Secondary | ICD-10-CM

## 2017-08-16 DIAGNOSIS — R52 Pain, unspecified: Secondary | ICD-10-CM

## 2017-08-16 MED ORDER — PROMETHAZINE HCL 25 MG PO TABS: 25.0000 mg | ORAL_TABLET | Freq: Four times a day (QID) | ORAL | 0 refills | Status: DC | PRN

## 2017-08-16 MED ORDER — KETOROLAC TROMETHAMINE 60 MG/2ML IM SOLN
60.0000 mg | Freq: Once | INTRAMUSCULAR | Status: AC
  Administered 2017-08-16: 60 mg via INTRAMUSCULAR

## 2017-08-16 MED ORDER — KETOROLAC TROMETHAMINE 30 MG/ML IJ SOLN: 30.0000 mg | Freq: Once | INTRAMUSCULAR | Status: DC

## 2017-08-16 NOTE — Discharge Instructions (Signed)
Rest, fluids.  Phenergan as needed.  If you worsen, go to the ER.  Take care  Dr. Lacinda Axon

## 2017-08-16 NOTE — ED Provider Notes (Signed)
[facility]    CSN: [id number] Arrival date & time: 08/16/17  1759  History   Chief Complaint Chief Complaint  Patient presents with  . Generalized Body Aches   HPI  52 year old female presents with nausea, vomiting, diarrhea, and body aches.  Started abruptly this morning.  Began with nausea, vomiting, diarrhea.  Now with severe body aches.  Pain is severe.  Unrelenting.  She reports associated chills.  No documented fever.  She is used Zofran with improvement in her nausea and vomiting.  Her primary complaint at this time is body aches.  No other medications or interventions tried.  No known inciting factor.  No other associated symptoms.  No other complaints.  PMH: Shoulder pain, left 02/02/2017  Last Assessment & Plan:   Pt has ortho appointment scheduled for 03/03/17.  Begin taking ibuprofen TID for 3 days, then decrease to PRN use. Begin taking metaxalone 800 mg TID for 3 days, then decrease to nightly use.   Cervical radiculopathy at C5 10/06/2016  Degenerative disc disease, cervical 10/06/2016  Renal insufficiency 10/06/2016  Back muscle spasm 03/09/2016  Last Assessment & Plan:   Continue PT. Advised lidocaine patches as pt endorses somnolence with ibuprofen and acetaminophen.  If symptoms persist after PT, consider referral to ortho.    Chronic right shoulder pain 03/09/2016  Last Assessment & Plan:   Continue PT. Advised lidocaine patches as pt endorses somnolence with ibuprofen and acetaminophen.  If symptoms persist after PT, consider referral to ortho.    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  Mostly, reassured pt.  Retest ordered per pt request.   Pt states that if test is + again, she will follow recommendations.  She is somewhat in denial and does not want to discuss consequences of a second positive test  Greater than 50% of this  encounter was spent in direct consultation with the patient in evaluation and discussing the following. Duration of encounter: 20 minutes.    Screening examination for STD (sexually transmitted disease) 10/29/2015  Last Assessment & Plan:   Per pt request screening today    Hx of pancreatitis 10/29/2015  Last Assessment & Plan:   Has been asymptomatic Pt requesting labs today    Swelling of both hands 05/10/2015  Vaginal spotting 04/09/2015  Last Assessment & Plan:   For several days following intercourse with pelvic discomfort.  Mirena IUD in place, strings visualized. No discharge or blood on cervical os or in vaginal canal. Suprapubic tenderness over uterine body on bimanual exam.  Reassured that this is normal.    Polyarthralgia 01/14/2015  Right wrist pain 01/14/2015  Chronic foot pain, right 01/14/2015  Long term current use of non-steroidal anti-inflammatories (NSAID) 11/02/2014  Chronic neck pain 11/02/2014  Hair thinning 11/02/2014  Thyroid dysfunction 11/02/2014  Last Assessment & Plan:   Recheck levels today    Bilateral low back pain without sciatica 10/10/2014  Last Assessment & Plan:   +palpable muscle spasms on exam. Tizanidine prescribed -- up to QID as needed. Ibuprofen 800mg  also prescribed at her request, although we did discuss that her Cr is borderline (baseline for her) and I do not recommend ibuprofen for chronic use in her case, would prefer she stick to Tylenol once this acute issue is resolved. 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 X-ray ordered given the history, and the fact that she  reports she never had one Comfort care reviewed -- massage, gentle stretching, heating pad, hot shower Discussed option of PT if this continues to be an acute/persistent issue; she agrees   Well adult exam 09/28/2014  Last Assessment & Plan:   Healthy and doing well Encouraged continued healthy habits with diet and  exercise CBC+diff, CMP, TSH, and near-fasting lipids today (hasn't eaten in almost 6 hours). Also checked lipase since she has hx pancreatitis last year. Pap due 08/2018 Next CPE in 1 year   Routine general medical examination at a health care facility 09/13/2013  Last Assessment & Plan:   CPE. Age appropriate preventative care, health maintenance, and anticipatory guidance discussed/handout provided. Immunizations:  Influenza vaccine - 12/02/16  TdaP - 02/10/10 Screenings:  Pap - last completed on 09/13/13; pap normal, HPV negative; repeat testing completed today.  Mammogram - last completed on 12/20/15; repeat scheduled 01/01/17 Ordered labs as below.   Family history of diabetes mellitus 09/11/2008  Absence of menstruation    Surgical hx: CERVICAL BIOPSY W/ LOOP ELECTRODE EXCISION      BREAST BIOPSY  Right BENIGN   PR ENDOSCOPIC US EXAM, [place] 12/28/2013 N/A Procedure: UGI ENDOSCOPY; WITH ENDOSCOPIC ULTRASOUND EXAMINATION LIMITED TO THE ESOPHAGUS; Surgeon: Christena Flake, MD; [facility]; Service: Gastroenterology    OB History   None    Home Medications    Prior to Admission medications   Medication Sig Start Date End Date Taking? Authorizing Provider  Flaxseed, Linseed, (FLAX SEED OIL) 1000 MG CAPS Take by mouth. 08/27/11  Yes [provider]  levonorgestrel (MIRENA, 52 MG,) 20 MCG/24HR IUD  12/26/13  Yes [provider]  Multiple Vitamin (MULTI-VITAMINS) TABS Take by mouth. 08/27/11  Yes [provider]  ondansetron (ZOFRAN) 4 MG tablet Take by mouth. 03/17/17 03/17/18 Yes [provider]  promethazine (PHENERGAN) 25 MG tablet Take 1 tablet (25 mg total) by mouth every 6 (six) hours as needed for nausea or vomiting. 08/16/17   [name], DO    Family History Family History  Problem Relation Age of Onset  . Diabetes Other   . Diabetes Father   . Kidney disease Father   . Heart attack Father      Social History Social History   Tobacco Use  . Smoking status: Never Smoker  . Smokeless tobacco: Never Used  Substance Use Topics  . Alcohol use: Yes    Comment: socially  . Drug use: Never     Allergies   Sulfa antibiotics   Review of Systems Review of Systems  Constitutional: Positive for chills.  Gastrointestinal: Positive for diarrhea, nausea and vomiting.  Musculoskeletal:       Body aches.   Physical Exam Triage Vital Signs ED Triage Vitals  Enc Vitals Group     BP 08/16/17 1838 122/81     Pulse Rate 08/16/17 1838 (!) 130     Resp 08/16/17 1838 18     Temp 08/16/17 1838 99.9 F (37.7 C)     Temp Source 08/16/17 1838 Oral     SpO2 08/16/17 1838 100 %     Weight 08/16/17 1835 170 lb (77.1 kg)     Height 08/16/17 1835 5\' 8"  (1.727 m)     Head Circumference --      Peak Flow --      Pain Score 08/16/17 1835 10     Pain Loc --      Pain Edu? --  Excl. in [place]? --    Updated Vital Signs BP 122/81 (BP Location: Left Arm)   Pulse (!) 130   Temp 99.9 F (37.7 C) (Oral)   Resp 18   Ht 5\' 8"  (1.727 m)   Wt 170 lb (77.1 kg)   SpO2 100%   BMI 25.85 kg/m      Physical Exam  Constitutional: She is oriented to person, place, and time. She appears well-developed.  HENT:  Head: Normocephalic and atraumatic.  Oropharynx clear.  Dry mucous membranes.  Cardiovascular:  Tachycardic. Regular rhythm.  Pulmonary/Chest: Effort normal. She has no wheezes. She has no rales.  Abdominal: Soft. She exhibits no distension.  Mild tenderness in the epigastric region.  Neurological: She is alert and oriented to person, place, and time.  Psychiatric: She has a normal mood and affect. Her behavior is normal.  Nursing note and vitals reviewed.  UC Treatments / Results  Labs (all labs ordered are listed, but only abnormal results are displayed) Labs Reviewed - No data to display  EKG None  Radiology No results found.  Procedures Procedures (including  critical care time)  Medications Ordered in UC Medications  ketorolac (TORADOL) injection 60 mg (60 mg Intramuscular Given 08/16/17 1928)    Initial Impression / Assessment and Plan / UC Course  I have reviewed the triage vital signs and the nursing notes.  Pertinent labs & imaging results that were available during my care of the patient were reviewed by me and considered in my medical decision making (see chart for details).    52 year old female presents with a likely viral illness.  IV access was attempted without success today.  Patient was given Toradol here for pain.  Sent home on Phenergan for nausea.  Advised aggressive hydration.  Patient to go to the hospital if she fails to improve or worsens.  Final Clinical Impressions(s) / UC Diagnoses   Final diagnoses:  Body aches  Nausea vomiting and diarrhea     Discharge Instructions     Rest, fluids.  Phenergan as needed.  If you worsen, go to the ER.  Take care  Dr. Lacinda Axon    ED Prescriptions    Medication Sig Dispense Auth. Provider   promethazine (PHENERGAN) 25 MG tablet Take 1 tablet (25 mg total) by mouth every 6 (six) hours as needed for nausea or vomiting. 30 tablet [name], DO     Controlled Substance Prescriptions [place] Controlled Substance Registry consulted? Not Applicable   [name], DO 08/16/17 2102

## 2017-08-16 NOTE — ED Triage Notes (Signed)
Patient complains of body aches, vomiting, moving makes her vomit, headaches that started this morning.

## 2017-09-21 NOTE — Progress Notes (Signed)
[name] [place], [place] [address] Phone: [phone] Subjective:    I'm seeing this patient by the request  of:  Foley, Charolotte Eke, MD   CC: Bilateral shoulder pain  [id number]  Jenna [name] is a 52 y.o. female coming in with complaint of bilateral shoulder pain. Left is worse. States that she is a side sleeper and she can't sleep on either side. Gets numbness and tingling in the shoulder. States she has neck pain as well. C3-C5 limited space.  This was independently visualized with patient previous x-rays.  Patient has seen other providers as well for this.  Onset- 2 years  Location- scapula Duration- consistent pain Character- sharp, dull, achy, burning Aggravating factors- flexion, reaching Reliving factors- Heat Therapies tried- PT, injections, dry needling Severity- 6-7    Patient did have MRIs of the shoulders bilaterally done.  The MRI of the right shoulder shows a labral pathology that is fairly significant as well as a para labral cyst.  Left shoulder has some intrasubstance tearing of the tendon but no true retraction.  No past medical history on file. Past Surgical History:  Procedure Laterality Date  . CERVICAL CERCLAGE     Social History   Socioeconomic History  . Marital status: Divorced    Spouse name: Not on file  . Number of children: Not on file  . Years of education: Not on file  . Highest education level: Not on file  Occupational History  . Not on file  Social Needs  . Financial resource strain: Not on file  . Food insecurity:    Worry: Not on file    Inability: Not on file  . Transportation needs:    Medical: Not on file    Non-medical: Not on file  Tobacco Use  . Smoking status: Never Smoker  . Smokeless tobacco: Never Used  Substance and Sexual Activity  . Alcohol use: Yes    Comment: socially  . Drug use: Never  . Sexual activity: Not on file  Lifestyle  . Physical activity:    Days per week: Not on file    Minutes per session: Not on file  . Stress: Not on file  Relationships  . Social connections:    Talks on phone: Not on file    Gets together: Not on file    Attends religious service: Not on file    Active member of club or organization: Not on file    Attends meetings of clubs or organizations: Not on file    Relationship status: Not on file  Other Topics Concern  . Not on file  Social History Narrative  . Not on file   Allergies  Allergen Reactions  . Sulfa Antibiotics    Family History  Problem Relation Age of Onset  . Diabetes Other   . Diabetes Father   . Kidney disease Father   . Heart attack Father      Past medical history, social, surgical and family history all reviewed in electronic medical record.  No pertanent information unless stated regarding to the chief complaint.   Review of Systems:Review of systems updated and as accurate as of 09/22/17  No headache, visual changes, nausea, vomiting, diarrhea, constipation, dizziness, abdominal pain, skin rash, fevers, chills, night sweats, weight loss, swollen lymph nodes, body aches, joint swelling, muscle aches, chest pain, shortness of breath, mood changes.   Objective  Blood pressure 130/84, pulse 91, height 5\' 8"  (1.727 m),  weight 177 lb (80.3 kg), SpO2 98 %. Systems examined below as of 09/22/17   General: No apparent distress alert and oriented x3 mood and affect normal, dressed appropriately.  HEENT: Pupils equal, extraocular movements intact  Respiratory: Patient's speak in full sentences and does not appear short of breath  Cardiovascular: No lower extremity edema, non tender, no erythema  Skin: Warm dry intact with no signs of infection or rash on extremities or on axial skeleton.  Abdomen: Soft nontender  Neuro: Cranial nerves II through XII are intact, neurovascularly intact in all extremities with 2+ DTRs and 2+ pulses.  Lymph: No lymphadenopathy of posterior or anterior  cervical chain or axillae bilaterally.  Gait normal with good balance and coordination.  MSK:  Non tender with full range of motion and good stability and symmetric strength and tone of  elbows, wrist, hip, knee and ankles bilaterally.   Shoulder: Right Inspection reveals no abnormalities, atrophy or asymmetry. Palpation is normal with no tenderness over AC joint or bicipital groove. ROM is full in all planes. Rotator cuff strength normal throughout. Positive impingement Speeds and Yergason's tests normal. Positive crossover positive O'Brien Normal scapular function observed. No painful arc and no drop arm sign. No apprehension sign  Left shoulder show some positive impingement but full range of motion.  5 out of 5 strength of the rotator cuff.  Negative crossover, negative O'Brien's.      Impression and Recommendations:     This case required medical decision making of moderate complexity.      Note: This dictation was prepared with Dragon dictation along with smaller phrase technology. Any transcriptional errors that result from this process are unintentional.

## 2017-09-22 ENCOUNTER — Encounter: Payer: Self-pay | Admitting: Family Medicine

## 2017-09-22 ENCOUNTER — Ambulatory Visit: Payer: 59 | Admitting: Family Medicine

## 2017-09-22 DIAGNOSIS — M5030 Other cervical disc degeneration, unspecified cervical region: Secondary | ICD-10-CM | POA: Diagnosis not present

## 2017-09-22 DIAGNOSIS — S43431A Superior glenoid labrum lesion of right shoulder, initial encounter: Secondary | ICD-10-CM | POA: Diagnosis not present

## 2017-09-22 DIAGNOSIS — M75112 Incomplete rotator cuff tear or rupture of left shoulder, not specified as traumatic: Secondary | ICD-10-CM | POA: Diagnosis not present

## 2017-09-22 DIAGNOSIS — M75102 Unspecified rotator cuff tear or rupture of left shoulder, not specified as traumatic: Secondary | ICD-10-CM | POA: Insufficient documentation

## 2017-09-22 MED ORDER — VITAMIN D (ERGOCALCIFEROL) 1.25 MG (50000 UNIT) PO CAPS: 50000.0000 [IU] | ORAL_CAPSULE | ORAL | 0 refills | Status: DC

## 2017-09-22 MED ORDER — NITROGLYCERIN 0.2 MG/HR TD PT24: MEDICATED_PATCH | TRANSDERMAL | 1 refills | Status: DC

## 2017-09-22 MED ORDER — DICLOFENAC SODIUM 2 % TD SOLN: 2.0000 g | Freq: Two times a day (BID) | TRANSDERMAL | 3 refills | Status: DC

## 2017-09-22 NOTE — Assessment & Plan Note (Signed)
Large labral tear with a para labral cyst.  Patient wants to avoid surgical intervention.  Discussed home exercises and avoiding certain activities.  Discussed the possibility of PRP.  Vitamin D given.  Discussed limiting range of motion of the shoulder in certain ways.  Follow-up again in 4 weeks.  Differential also includes a cervical radiculopathy.

## 2017-09-22 NOTE — Assessment & Plan Note (Signed)
Left rotator cuff tear.  Seems to be intrasubstance.  Started on nitroglycerin and warned of potential side effects, topical anti-inflammatories given, encourage patient to home exercises on a regular basis.  Follow-up again in 4 to 6 weeks

## 2017-09-22 NOTE — Patient Instructions (Signed)
Good to see you  Your shoulders are messed up  Ice 20 minutes 2 times daily. Usually after activity and before bed. pennsaid pinkie amount topically 2 times daily as needed.  Once weekly vitamin D for 12 weeks Nitroglycerin Protocol   Apply 1/4 nitroglycerin patch to affected area daily.  Change position of patch within the affected area every 24 hours.  You may experience a headache during the first 1-2 weeks of using the patch, these should subside.  If you experience headaches after beginning nitroglycerin patch treatment, you may take your preferred over the counter pain reliever.  Another side effect of the nitroglycerin patch is skin irritation or rash related to patch adhesive.  Please notify our office if you develop more severe headaches or rash, and stop the patch.  Tendon healing with nitroglycerin patch may require 12 to 24 weeks depending on the extent of injury.  Men should not use if taking Viagra, Cialis, or Levitra.   Do not use if you have migraines or rosacea.   Exercises 3 times a week.  Keep hands within peripheral vision  See me again in 4 weeks and if not better we will consider labs and read about PRP

## 2017-10-05 ENCOUNTER — Encounter: Payer: Self-pay | Admitting: Family Medicine

## 2017-10-11 ENCOUNTER — Other Ambulatory Visit: Payer: Self-pay

## 2017-10-11 DIAGNOSIS — M2550 Pain in unspecified joint: Secondary | ICD-10-CM

## 2017-10-13 ENCOUNTER — Telehealth: Payer: Self-pay | Admitting: Family Medicine

## 2017-10-13 NOTE — Telephone Encounter (Signed)
Spoke with patient who has decided to come get her labs done at [facility].

## 2017-10-13 NOTE — Telephone Encounter (Signed)
Copied from [place] [phone]. Topic: General - Other >> Oct 13, 2017  2:54 PM Sheran Luz wrote: CRM for notification. See Telephone encounter for: 10/13/17.  Pt called requesting that lab orders be sent to [facility] in [place].  Call back number [phone]

## 2017-10-14 ENCOUNTER — Other Ambulatory Visit (INDEPENDENT_AMBULATORY_CARE_PROVIDER_SITE_OTHER): Payer: 59

## 2017-10-14 DIAGNOSIS — M2550 Pain in unspecified joint: Secondary | ICD-10-CM

## 2017-10-14 LAB — IBC PANEL
Iron: 271 ug/dL — ABNORMAL HIGH (ref 42–145)
SATURATION RATIOS: 58.3 % — AB (ref 20.0–50.0)
Transferrin: 332 mg/dL (ref 212.0–360.0)

## 2017-10-14 LAB — CBC WITH DIFFERENTIAL/PLATELET
BASOS ABS: 0 10*3/uL (ref 0.0–0.1)
Basophils Relative: 0.7 % (ref 0.0–3.0)
EOS PCT: 2.6 % (ref 0.0–5.0)
Eosinophils Absolute: 0.2 10*3/uL (ref 0.0–0.7)
HEMATOCRIT: 36.6 % (ref 36.0–46.0)
HEMOGLOBIN: 11.8 g/dL — AB (ref 12.0–15.0)
LYMPHS PCT: 33.1 % (ref 12.0–46.0)
Lymphs Abs: 2 10*3/uL (ref 0.7–4.0)
MCHC: 32.2 g/dL (ref 30.0–36.0)
MCV: 79.6 fl (ref 78.0–100.0)
MONOS PCT: 7.5 % (ref 3.0–12.0)
Monocytes Absolute: 0.5 10*3/uL (ref 0.1–1.0)
NEUTROS PCT: 56.1 % (ref 43.0–77.0)
Neutro Abs: 3.4 10*3/uL (ref 1.4–7.7)
Platelets: 217 10*3/uL (ref 150.0–400.0)
RBC: 4.6 Mil/uL (ref 3.87–5.11)
RDW: 14.4 % (ref 11.5–15.5)
WBC: 6.1 10*3/uL (ref 4.0–10.5)

## 2017-10-14 LAB — TSH: TSH: 0.86 u[IU]/mL (ref 0.35–4.50)

## 2017-10-14 LAB — C-REACTIVE PROTEIN: CRP: 0.2 mg/dL — AB (ref 0.5–20.0)

## 2017-10-14 LAB — SEDIMENTATION RATE: SED RATE: 30 mm/h — AB (ref 0–20)

## 2017-10-14 LAB — VITAMIN D 25 HYDROXY (VIT D DEFICIENCY, FRACTURES): VITD: 29.32 ng/mL — AB (ref 30.00–100.00)

## 2017-10-14 LAB — URIC ACID: Uric Acid, Serum: 5.5 mg/dL (ref 2.4–7.0)

## 2017-10-18 LAB — PTH, INTACT AND CALCIUM
CALCIUM: 9.2 mg/dL (ref 8.6–10.2)
PTH: 29 pg/mL (ref 14–64)

## 2017-10-18 LAB — ANGIOTENSIN CONVERTING ENZYME: ANGIOTENSIN-CONVERTING ENZYME: 77 U/L — AB (ref 9–67)

## 2017-10-18 LAB — CYCLIC CITRUL PEPTIDE ANTIBODY, IGG: Cyclic Citrullin Peptide Ab: <16 UNITS

## 2017-10-18 LAB — ANA: ANA: NEGATIVE

## 2017-10-18 LAB — CALCIUM, IONIZED: Calcium, Ion: 5.31 mg/dL (ref 4.8–5.6)

## 2017-10-18 LAB — RHEUMATOID FACTOR: Rhuematoid fact SerPl-aCnc: <14 IU/mL (ref <14)

## 2017-10-18 NOTE — Progress Notes (Signed)
[name] [place], [place] [address] Phone: [phone] Subjective:    I'm seeing this patient by the request  of:    CC: Left shoulder pain  [id number]  Jenna [name] is a 52 y.o. female coming in with complaint of shoulder pain. She feels that she is sleeping better. Taking tart cherry. Her entire body is achy and she has stopped nitro patch. Is feeling a stiffness now in entire body. Feels that her shoulder is somewhat better. Continues to have nerve pain in left scapula.  Patient has been found to have an intrasubstance tear of the rotator cuff but no true retraction.  Mild inferior labral tear also noted.  Patient states that she is a chronic pain.  The neck did show some mild arthritic changes.  Not responding though to the other medicines well.  Wanting to know what the next step is      No past medical history on file. Past Surgical History:  Procedure Laterality Date  . CERVICAL CERCLAGE     Social History   Socioeconomic History  . Marital status: Divorced    Spouse name: Not on file  . Number of children: Not on file  . Years of education: Not on file  . Highest education level: Not on file  Occupational History  . Not on file  Social Needs  . Financial resource strain: Not on file  . Food insecurity:    Worry: Not on file    Inability: Not on file  . Transportation needs:    Medical: Not on file    Non-medical: Not on file  Tobacco Use  . Smoking status: Never Smoker  . Smokeless tobacco: Never Used  Substance and Sexual Activity  . Alcohol use: Yes    Comment: socially  . Drug use: Never  . Sexual activity: Not on file  Lifestyle  . Physical activity:    Days per week: Not on file    Minutes per session: Not on file  . Stress: Not on file  Relationships  . Social connections:    Talks on phone: Not on file    Gets together: Not on file    Attends religious service: Not on file    Active member of  club or organization: Not on file    Attends meetings of clubs or organizations: Not on file    Relationship status: Not on file  Other Topics Concern  . Not on file  Social History Narrative  . Not on file   Allergies  Allergen Reactions  . Sulfa Antibiotics    Family History  Problem Relation Age of Onset  . Diabetes Other   . Diabetes Father   . Kidney disease Father   . Heart attack Father      Past medical history, social, surgical and family history all reviewed in electronic medical record.  No pertanent information unless stated regarding to the chief complaint.   Review of Systems:Review of systems updated and as accurate as of 10/20/17  No headache, visual changes, nausea, vomiting, diarrhea, constipation, dizziness, abdominal pain, skin rash, fevers, chills, night sweats, weight loss, swollen lymph nodes, , chest pain, shortness of breath, mood changes.  Positive muscle aches and body aches  Objective  Blood pressure 130/80, pulse 91, height 5\' 8"  (1.727 m), weight 172 lb (78 kg), SpO2 98 %. Systems examined below as of 10/20/17   General: No apparent distress alert and oriented x3 mood  and affect normal, dressed appropriately.  HEENT: Pupils equal, extraocular movements intact  Respiratory: Patient's speak in full sentences and does not appear short of breath  Cardiovascular: No lower extremity edema, non tender, no erythema  Skin: Warm dry intact with no signs of infection or rash on extremities or on axial skeleton.  Abdomen: Soft nontender  Neuro: Cranial nerves II through XII are intact, neurovascularly intact in all extremities with 2+ DTRs and 2+ pulses.  Lymph: No lymphadenopathy of posterior or anterior cervical chain or axillae bilaterally.  Gait normal with good balance and coordination.  MSK:  Non tender with full range of motion and good stability and symmetric strength and tone of , elbows, wrist, hip, knee and ankles bilaterally.    Neck: Inspection  loss of lordosis. No palpable stepoffs. Negative Spurling's maneuver. Full neck range of motion Grip strength and sensation normal in bilateral hands Strength good C4 to T1 distribution No sensory change to C4 to T1 Negative Hoffman sign bilaterally Reflexes normal Tightness of the left trapezius  Patient's left shoulder still has positive impingement and positive O'Brien's.  Rotator cuff strength 4+ out of 5 but full range of motion  Patient is contralateral shoulder unremarkable  Osteopathic findings C4 flexed rotated and side bent right  T3 extended rotated and side bent left inhaled third rib T9 extended rotated and side bent left L2 flexed rotated and side bent right     Impression and Recommendations:     This case required medical decision making of moderate complexity.      Note: This dictation was prepared with Dragon dictation along with smaller phrase technology. Any transcriptional errors that result from this process are unintentional.

## 2017-10-20 ENCOUNTER — Ambulatory Visit (INDEPENDENT_AMBULATORY_CARE_PROVIDER_SITE_OTHER): Payer: 59 | Admitting: Family Medicine

## 2017-10-20 ENCOUNTER — Encounter: Payer: Self-pay | Admitting: Family Medicine

## 2017-10-20 DIAGNOSIS — M5030 Other cervical disc degeneration, unspecified cervical region: Secondary | ICD-10-CM | POA: Diagnosis not present

## 2017-10-20 DIAGNOSIS — M999 Biomechanical lesion, unspecified: Secondary | ICD-10-CM | POA: Insufficient documentation

## 2017-10-20 MED ORDER — GABAPENTIN 100 MG PO CAPS: 200.0000 mg | ORAL_CAPSULE | Freq: Every day | ORAL | 3 refills | Status: DC

## 2017-10-20 NOTE — Patient Instructions (Addendum)
Good to see you  Ice is your friend Gabapentin 200mg  at night Calcium pyruvate 1500mg  daily could help with hormone regulation  Keep doing everything else See me agan in 4 weeks

## 2017-10-20 NOTE — Assessment & Plan Note (Signed)
Decision today to treat with OMT was based on Physical Exam  After verbal consent patient was treated with HVLA, ME, FPR techniques in cervical, thoracic, rib areas  Patient tolerated the procedure well with improvement in symptoms  Patient given exercises, stretches and lifestyle modifications  See medications in patient instructions if given  Patient will follow up in 4-6 weeks 

## 2017-10-20 NOTE — Assessment & Plan Note (Signed)
Attempted osteopathic manipulation.  Discussed which activities to do which wants to avoid.  Discussed avoiding certain activities such as lifting above the head.  Responded fairly well to osteopathic manipulation.  We will see how patient responds.  Started on gabapentin as well.  Follow-up again in 4 weeks

## 2017-11-03 ENCOUNTER — Encounter: Payer: Self-pay | Admitting: Family Medicine

## 2017-11-22 ENCOUNTER — Other Ambulatory Visit: Payer: Self-pay | Admitting: Family Medicine

## 2017-11-22 NOTE — Telephone Encounter (Signed)
Refill done.  

## 2017-11-23 ENCOUNTER — Encounter: Payer: Self-pay | Admitting: Family Medicine

## 2017-12-07 ENCOUNTER — Ambulatory Visit: Payer: 59 | Admitting: Family Medicine

## 2017-12-07 NOTE — Progress Notes (Signed)
[name] [place], [place] [address] Phone: [phone] Subjective:   [name], am serving as a scribe for Dr. Hulan Saas.   CC: Neck pain follow-up  [id number]  Jenna [name] is a 52 y.o. female coming in with complaint of neck pain. She did have improvement following OMT last visit. Is feeling as if she is needing manipulation. Is using gabapentin and tart cherry which is helping her sleep.  Patient does state overall about 80% better.  Making significant progress.       No past medical history on file. Past Surgical History:  Procedure Laterality Date  . CERVICAL CERCLAGE     Social History   Socioeconomic History  . Marital status: Divorced    Spouse name: Not on file  . Number of children: Not on file  . Years of education: Not on file  . Highest education level: Not on file  Occupational History  . Not on file  Social Needs  . Financial resource strain: Not on file  . Food insecurity:    Worry: Not on file    Inability: Not on file  . Transportation needs:    Medical: Not on file    Non-medical: Not on file  Tobacco Use  . Smoking status: Never Smoker  . Smokeless tobacco: Never Used  Substance and Sexual Activity  . Alcohol use: Yes    Comment: socially  . Drug use: Never  . Sexual activity: Not on file  Lifestyle  . Physical activity:    Days per week: Not on file    Minutes per session: Not on file  . Stress: Not on file  Relationships  . Social connections:    Talks on phone: Not on file    Gets together: Not on file    Attends religious service: Not on file    Active member of club or organization: Not on file    Attends meetings of clubs or organizations: Not on file    Relationship status: Not on file  Other Topics Concern  . Not on file  Social History Narrative  . Not on file   Allergies  Allergen Reactions  . Sulfa Antibiotics    Family History  Problem Relation Age of  Onset  . Diabetes Other   . Diabetes Father   . Kidney disease Father   . Heart attack Father     Current Outpatient Medications (Endocrine & Metabolic):  .  levonorgestrel (MIRENA, 52 MG,) 20 MCG/24HR IUD,   Current Outpatient Medications (Cardiovascular):  .  nitroGLYCERIN (NITRODUR - DOSED IN MG/24 HR) 0.2 mg/hr patch, 1/4 patch daily  Current Outpatient Medications (Respiratory):  .  promethazine (PHENERGAN) 25 MG tablet, Take 1 tablet (25 mg total) by mouth every 6 (six) hours as needed for nausea or vomiting.    Current Outpatient Medications (Other):  Marland Kitchen  Diclofenac Sodium 2 % SOLN, Place 2 g onto the skin 2 (two) times daily. .  Flaxseed, Linseed, (FLAX SEED OIL) 1000 MG CAPS, Take by mouth. .  gabapentin (NEURONTIN) 100 MG capsule, Take 2 capsules (200 mg total) by mouth at bedtime. .  Multiple Vitamin (MULTI-VITAMINS) TABS, Take by mouth. .  ondansetron (ZOFRAN) 4 MG tablet, Take by mouth. .  Vitamin D, Ergocalciferol, (DRISDOL) 50000 units CAPS capsule, TAKE 1 CAPSULE BY MOUTH EVERY 7 DAYS.    Past medical history, social, surgical and family history all reviewed in electronic medical record.  No  pertanent information unless stated regarding to the chief complaint.   Review of Systems:  No headache, visual changes, nausea, vomiting, diarrhea, constipation, dizziness, abdominal pain, skin rash, fevers, chills, night sweats, weight loss, swollen lymph nodes, body aches, joint swelling, muscle aches, chest pain, shortness of breath, mood changes.   Objective  Blood pressure (!) 124/92, pulse 87, height 5\' 8"  (1.727 m), weight 177 lb (80.3 kg), SpO2 90 %.    General: No apparent distress alert and oriented x3 mood and affect normal, dressed appropriately.  HEENT: Pupils equal, extraocular movements intact  Respiratory: Patient's speak in full sentences and does not appear short of breath  Cardiovascular: No lower extremity edema, non tender, no erythema  Skin: Warm dry  intact with no signs of infection or rash on extremities or on axial skeleton.  Abdomen: Soft nontender  Neuro: Cranial nerves II through XII are intact, neurovascularly intact in all extremities with 2+ DTRs and 2+ pulses.  Lymph: No lymphadenopathy of posterior or anterior cervical chain or axillae bilaterally.  Gait normal with good balance and coordination.  MSK:  Non tender with full range of motion and good stability and symmetric strength and tone of elbows, wrist, hip, knee and ankles bilaterally.  Continues impingement of the shoulders bilaterally Neck: Inspection unremarkable. No palpable stepoffs. Negative Spurling's maneuver. Mild limited range of motion with sidebending and extension by 5 degrees Grip strength and sensation normal in bilateral hands Strength good C4 to T1 distribution No sensory change to C4 to T1 Negative Hoffman sign bilaterally Reflexes normal Tightness of the trapezius bilaterally  Osteopathic findings C2 flexed rotated and side bent right C4 flexed rotated and side bent left C7 flexed rotated and side bent right  T3 extended rotated and side bent right inhaled third rib    Impression and Recommendations:     This case required medical decision making of moderate complexity. The above documentation has been reviewed and is accurate and complete [name], DO       Note: This dictation was prepared with Dragon dictation along with smaller phrase technology. Any transcriptional errors that result from this process are unintentional.

## 2017-12-10 ENCOUNTER — Ambulatory Visit: Payer: 59 | Admitting: Family Medicine

## 2017-12-10 ENCOUNTER — Encounter: Payer: Self-pay | Admitting: Family Medicine

## 2017-12-10 VITALS — BP 124/92 | HR 87 | Ht 68.0 in | Wt 177.0 lb

## 2017-12-10 DIAGNOSIS — M5030 Other cervical disc degeneration, unspecified cervical region: Secondary | ICD-10-CM

## 2017-12-10 DIAGNOSIS — M999 Biomechanical lesion, unspecified: Secondary | ICD-10-CM

## 2017-12-10 NOTE — Patient Instructions (Signed)
You are doing great  Ice is your friend when needed Continue the gabapentin  I am impressed Posture is key  See me again in 6 weeks!

## 2017-12-10 NOTE — Assessment & Plan Note (Signed)
Patient has known arthritic changes.  Has been responding well to the conservative therapy.  Encourage patient to continue the gabapentin.  Responded well to manipulation.  Discussed posture and ergonomics.  Patient has made a an abbreviated work schedule that seems to be helping.  Follow-up again in 6 weeks

## 2017-12-10 NOTE — Assessment & Plan Note (Signed)
Decision today to treat with OMT was based on Physical Exam  After verbal consent patient was treated with HVLA, ME, FPR techniques in cervical, thoracic, rib areas  Patient tolerated the procedure well with improvement in symptoms  Patient given exercises, stretches and lifestyle modifications  See medications in patient instructions if given  Patient will follow up in 6 weeks

## 2017-12-29 DIAGNOSIS — H52223 Regular astigmatism, bilateral: Secondary | ICD-10-CM | POA: Diagnosis not present

## 2017-12-29 DIAGNOSIS — H5213 Myopia, bilateral: Secondary | ICD-10-CM | POA: Diagnosis not present

## 2017-12-29 DIAGNOSIS — H524 Presbyopia: Secondary | ICD-10-CM | POA: Diagnosis not present

## 2018-01-03 DIAGNOSIS — Z006 Encounter for examination for normal comparison and control in clinical research program: Secondary | ICD-10-CM | POA: Diagnosis not present

## 2018-01-03 DIAGNOSIS — Z1239 Encounter for other screening for malignant neoplasm of breast: Secondary | ICD-10-CM | POA: Diagnosis not present

## 2018-01-03 DIAGNOSIS — Z1231 Encounter for screening mammogram for malignant neoplasm of breast: Secondary | ICD-10-CM | POA: Diagnosis not present

## 2018-01-17 ENCOUNTER — Encounter: Payer: Self-pay | Admitting: Family Medicine

## 2018-01-24 NOTE — Progress Notes (Signed)
[name] [address], [place] [address] Phone: [phone] Subjective:   [name], am serving as a scribe for Dr. Hulan Saas.   CC: Left shoulder and neck pain follow-up  [id number]  Jenna [name] is a 52 y.o. female coming in with complaint of left shoulder pain. She feels like her pain is 1/10. She does have pain with flexion. She is taking gabapentin and tart cherry. Has not been doing the exercises as much.   She does note tightness in her hands and feet in the morning which subsides after a few minutes of movement. Her hips have also been tight.  Patient has had laboratory work-up for polyarthralgia previously that did show a low vitamin D as well as high iron.  This was 3 months ago.  Patient does not know if she is making any significant improvement and would like to consider having another laboratory work-up.     No past medical history on file. Past Surgical History:  Procedure Laterality Date  . CERVICAL CERCLAGE     Social History   Socioeconomic History  . Marital status: Divorced    Spouse name: Not on file  . Number of children: Not on file  . Years of education: Not on file  . Highest education level: Not on file  Occupational History  . Not on file  Social Needs  . Financial resource strain: Not on file  . Food insecurity:    Worry: Not on file    Inability: Not on file  . Transportation needs:    Medical: Not on file    Non-medical: Not on file  Tobacco Use  . Smoking status: Never Smoker  . Smokeless tobacco: Never Used  Substance and Sexual Activity  . Alcohol use: Yes    Comment: socially  . Drug use: Never  . Sexual activity: Not on file  Lifestyle  . Physical activity:    Days per week: Not on file    Minutes per session: Not on file  . Stress: Not on file  Relationships  . Social connections:    Talks on phone: Not on file    Gets together: Not on file    Attends religious  service: Not on file    Active member of club or organization: Not on file    Attends meetings of clubs or organizations: Not on file    Relationship status: Not on file  Other Topics Concern  . Not on file  Social History Narrative  . Not on file   Allergies  Allergen Reactions  . Sulfa Antibiotics    Family History  Problem Relation Age of Onset  . Diabetes Other   . Diabetes Father   . Kidney disease Father   . Heart attack Father     Current Outpatient Medications (Endocrine & Metabolic):  .  levonorgestrel (MIRENA, 52 MG,) 20 MCG/24HR IUD,   Current Outpatient Medications (Cardiovascular):  .  nitroGLYCERIN (NITRODUR - DOSED IN MG/24 HR) 0.2 mg/hr patch, 1/4 patch daily  Current Outpatient Medications (Respiratory):  .  promethazine (PHENERGAN) 25 MG tablet, Take 1 tablet (25 mg total) by mouth every 6 (six) hours as needed for nausea or vomiting.    Current Outpatient Medications (Other):  Marland Kitchen  Diclofenac Sodium 2 % SOLN, Place 2 g onto the skin 2 (two) times daily. .  Flaxseed, Linseed, (FLAX SEED OIL) 1000 MG CAPS, Take by mouth. .  gabapentin (NEURONTIN) 100 MG  capsule, Take 2 capsules (200 mg total) by mouth at bedtime. .  Multiple Vitamin (MULTI-VITAMINS) TABS, Take by mouth. .  ondansetron (ZOFRAN) 4 MG tablet, Take by mouth. .  Vitamin D, Ergocalciferol, (DRISDOL) 50000 units CAPS capsule, TAKE 1 CAPSULE BY MOUTH EVERY 7 DAYS.    Past medical history, social, surgical and family history all reviewed in electronic medical record.  No pertanent information unless stated regarding to the chief complaint.   Review of Systems:  No headache, visual changes, nausea, vomiting, diarrhea, constipation, dizziness, abdominal pain, skin rash, fevers, chills, night sweats, weight loss, swollen lymph nodes, body aches,  chest pain, shortness of breath, mood changes.  Positive muscle aches and joint swelling  Objective  Blood pressure 110/88, pulse 84, height 5\' 8"  (1.727  m), weight 174 lb (78.9 kg), SpO2 99 %.    General: No apparent distress alert and oriented x3 mood and affect normal, dressed appropriately.  HEENT: Pupils equal, extraocular movements intact  Respiratory: Patient's speak in full sentences and does not appear short of breath  Cardiovascular: No lower extremity edema, non tender, no erythema  Skin: Warm dry intact with no signs of infection or rash on extremities or on axial skeleton.  Abdomen: Soft nontender  Neuro: Cranial nerves II through XII are intact, neurovascularly intact in all extremities with 2+ DTRs and 2+ pulses.  Lymph: No lymphadenopathy of posterior or anterior cervical chain or axillae bilaterally.  Gait normal with good balance and coordination.  MSK:  tender with full range of motion and good stability and symmetric strength and tone of shoulders, elbows, wrist, hip, knee and ankles bilaterally.  Neck: Inspection loss of lordosis. No palpable stepoffs. Negative Spurling's maneuver. Decreased range of motion in all planes by 5 to 10 degrees. Grip strength and sensation normal in bilateral hands Strength good C4 to T1 distribution No sensory change to C4 to T1 Negative Hoffman sign bilaterally Reflexes normal Tightness of the trapezius.  Osteopathic findings  C2 flexed rotated and side bent right C6 flexed rotated and side bent left T3 extended rotated and side bent right inhaled third rib    Impression and Recommendations:     This case required medical decision making of moderate complexity. The above documentation has been reviewed and is accurate and complete [name], DO       Note: This dictation was prepared with Dragon dictation along with smaller phrase technology. Any transcriptional errors that result from this process are unintentional.

## 2018-01-25 ENCOUNTER — Ambulatory Visit: Payer: 59 | Admitting: Family Medicine

## 2018-01-25 ENCOUNTER — Other Ambulatory Visit (INDEPENDENT_AMBULATORY_CARE_PROVIDER_SITE_OTHER): Payer: 59

## 2018-01-25 ENCOUNTER — Encounter: Payer: Self-pay | Admitting: Family Medicine

## 2018-01-25 VITALS — BP 110/88 | HR 84 | Ht 68.0 in | Wt 174.0 lb

## 2018-01-25 DIAGNOSIS — M2550 Pain in unspecified joint: Secondary | ICD-10-CM

## 2018-01-25 DIAGNOSIS — M999 Biomechanical lesion, unspecified: Secondary | ICD-10-CM

## 2018-01-25 DIAGNOSIS — M5030 Other cervical disc degeneration, unspecified cervical region: Secondary | ICD-10-CM | POA: Diagnosis not present

## 2018-01-25 LAB — CBC WITH DIFFERENTIAL/PLATELET
BASOS PCT: 0.9 % (ref 0.0–3.0)
Basophils Absolute: 0.1 10*3/uL (ref 0.0–0.1)
EOS PCT: 2.9 % (ref 0.0–5.0)
Eosinophils Absolute: 0.2 10*3/uL (ref 0.0–0.7)
HCT: 34.2 % — ABNORMAL LOW (ref 36.0–46.0)
HEMOGLOBIN: 10.8 g/dL — AB (ref 12.0–15.0)
LYMPHS ABS: 2.1 10*3/uL (ref 0.7–4.0)
Lymphocytes Relative: 31.7 % (ref 12.0–46.0)
MCHC: 31.5 g/dL (ref 30.0–36.0)
MCV: 74.2 fl — ABNORMAL LOW (ref 78.0–100.0)
MONO ABS: 0.5 10*3/uL (ref 0.1–1.0)
Monocytes Relative: 7.7 % (ref 3.0–12.0)
NEUTROS PCT: 56.8 % (ref 43.0–77.0)
Neutro Abs: 3.8 10*3/uL (ref 1.4–7.7)
Platelets: 239 10*3/uL (ref 150.0–400.0)
RBC: 4.61 Mil/uL (ref 3.87–5.11)
RDW: 15.1 % (ref 11.5–15.5)
WBC: 6.8 10*3/uL (ref 4.0–10.5)

## 2018-01-25 LAB — VITAMIN B12: Vitamin B-12: 409 pg/mL (ref 211–911)

## 2018-01-25 LAB — IBC PANEL
Iron: 24 ug/dL — ABNORMAL LOW (ref 42–145)
SATURATION RATIOS: 4.7 % — AB (ref 20.0–50.0)
Transferrin: 364 mg/dL — ABNORMAL HIGH (ref 212.0–360.0)

## 2018-01-25 LAB — FOLATE: FOLATE: 9.8 ng/mL (ref >5.9)

## 2018-01-25 LAB — SEDIMENTATION RATE: Sed Rate: 22 mm/hr — ABNORMAL HIGH (ref 0–20)

## 2018-01-25 LAB — FERRITIN: FERRITIN: 3.5 ng/mL — AB (ref 10.0–291.0)

## 2018-01-25 LAB — VITAMIN D 25 HYDROXY (VIT D DEFICIENCY, FRACTURES): VITD: 42.12 ng/mL (ref 30.00–100.00)

## 2018-01-25 NOTE — Assessment & Plan Note (Signed)
Continues to have some trouble with this.  Patient does have degenerative disc disease.  We are going to get laboratory work-up secondary to the polyarthralgia.  Has had difficulty previously.  Shoulder has been doing intermittently better though.  Discussed icing regimen and home exercise.  Which activities to do which wants to avoid.  Follow-up again in 4 to 6 weeks for more manipulation

## 2018-01-25 NOTE — Patient Instructions (Signed)
Good to see you  Enjoy the beach  Ice is your friend We will get labs again and see if anything is abnormal.  Continue the vitamins for now until you hear from me  See me again in 4 weeks

## 2018-01-25 NOTE — Assessment & Plan Note (Signed)
Decision today to treat with OMT was based on Physical Exam  After verbal consent patient was treated with HVLA, ME, FPR techniques in cervical, thoracic, rib, areas  Patient tolerated the procedure well with improvement in symptoms  Patient given exercises, stretches and lifestyle modifications  See medications in patient instructions if given  Patient will follow up in 4-6 weeks 

## 2018-01-26 LAB — HEPB+HEPC+HIV PANEL
HEP B C TOTAL AB: NEGATIVE
HEP B E AB: NEGATIVE
HEP B SURFACE AB, QUAL: REACTIVE
HIV Screen 4th Generation wRfx: NONREACTIVE
Hep B C IgM: NEGATIVE
Hep B E Ag: NEGATIVE
Hepatitis B Surface Ag: NEGATIVE

## 2018-01-26 LAB — ANGIOTENSIN CONVERTING ENZYME: Angiotensin-Converting Enzyme: 65 U/L (ref 9–67)

## 2018-01-26 LAB — CALCIUM, IONIZED: CALCIUM ION: 5.31 mg/dL (ref 4.8–5.6)

## 2018-02-03 ENCOUNTER — Ambulatory Visit (INDEPENDENT_AMBULATORY_CARE_PROVIDER_SITE_OTHER): Payer: 59

## 2018-02-03 ENCOUNTER — Other Ambulatory Visit: Payer: Self-pay

## 2018-02-03 ENCOUNTER — Ambulatory Visit
Admission: EM | Admit: 2018-02-03 | Discharge: 2018-02-03 | Disposition: A | Discharge status: Home / Self Care | Payer: 59 | Attending: Family Medicine | Admitting: Family Medicine

## 2018-02-03 ENCOUNTER — Encounter: Payer: Self-pay | Admitting: Emergency Medicine

## 2018-02-03 DIAGNOSIS — M62838 Other muscle spasm: Secondary | ICD-10-CM | POA: Diagnosis not present

## 2018-02-03 DIAGNOSIS — M542 Cervicalgia: Secondary | ICD-10-CM | POA: Diagnosis not present

## 2018-02-03 DIAGNOSIS — M5031 Other cervical disc degeneration,  high cervical region: Secondary | ICD-10-CM

## 2018-02-03 DIAGNOSIS — M50321 Other cervical disc degeneration at C4-C5 level: Secondary | ICD-10-CM | POA: Diagnosis not present

## 2018-02-03 MED ORDER — MELOXICAM 15 MG PO TABS: 15.0000 mg | ORAL_TABLET | Freq: Every day | ORAL | 0 refills | Status: DC | PRN

## 2018-02-03 MED ORDER — METAXALONE 800 MG PO TABS: 800.0000 mg | ORAL_TABLET | Freq: Three times a day (TID) | ORAL | 0 refills | Status: DC | PRN
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
## 2018-02-03 NOTE — Discharge Instructions (Signed)
Heat.  Meds as needed.  Take care  Dr. Lacinda Axon
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
## 2018-02-24 DIAGNOSIS — Z1211 Encounter for screening for malignant neoplasm of colon: Secondary | ICD-10-CM | POA: Diagnosis not present

## 2018-02-24 DIAGNOSIS — Z113 Encounter for screening for infections with a predominantly sexual mode of transmission: Secondary | ICD-10-CM | POA: Diagnosis not present

## 2018-02-24 DIAGNOSIS — Z0000 Encounter for general adult medical examination without abnormal findings: Secondary | ICD-10-CM | POA: Diagnosis not present

## 2018-02-24 DIAGNOSIS — Z6827 Body mass index (BMI) 27.0-27.9, adult: Secondary | ICD-10-CM | POA: Diagnosis not present

## 2018-02-24 DIAGNOSIS — Z13220 Encounter for screening for lipoid disorders: Secondary | ICD-10-CM | POA: Diagnosis not present

## 2018-02-24 DIAGNOSIS — Z23 Encounter for immunization: Secondary | ICD-10-CM | POA: Diagnosis not present
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
## 2018-02-28 ENCOUNTER — Ambulatory Visit: Payer: 59 | Admitting: Family Medicine

## 2018-03-01 ENCOUNTER — Encounter: Payer: Self-pay | Admitting: Family Medicine

## 2018-03-01 ENCOUNTER — Ambulatory Visit: Payer: 59 | Admitting: Family Medicine

## 2018-03-01 VITALS — BP 122/82 | HR 81 | Ht 68.0 in | Wt 178.0 lb

## 2018-03-01 DIAGNOSIS — M999 Biomechanical lesion, unspecified: Secondary | ICD-10-CM

## 2018-03-01 DIAGNOSIS — M5030 Other cervical disc degeneration, unspecified cervical region: Secondary | ICD-10-CM | POA: Diagnosis not present

## 2018-03-01 NOTE — Assessment & Plan Note (Signed)
Decision today to treat with OMT was based on Physical Exam  After verbal consent patient was treated with HVLA, ME, FPR techniques in cervical, thoracic, lrib areas  Patient tolerated the procedure well with improvement in symptoms  Patient given exercises, stretches and lifestyle modifications  See medications in patient instructions if given  Patient will follow up in 8 weeks
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
## 2018-03-01 NOTE — Patient Instructions (Signed)
Good to see you  Ice is your friend Stay active I am proud of you  See me again in 6-8 weeks!

## 2018-03-09 ENCOUNTER — Other Ambulatory Visit: Payer: Self-pay | Admitting: Family Medicine

## 2018-04-13 ENCOUNTER — Other Ambulatory Visit: Payer: Self-pay

## 2018-04-13 DIAGNOSIS — Z1211 Encounter for screening for malignant neoplasm of colon: Secondary | ICD-10-CM

## 2018-04-20 ENCOUNTER — Encounter: Payer: Self-pay | Admitting: Family Medicine

## 2018-04-21 ENCOUNTER — Other Ambulatory Visit: Payer: Self-pay

## 2018-04-21 DIAGNOSIS — M2550 Pain in unspecified joint: Secondary | ICD-10-CM

## 2018-04-21 NOTE — Progress Notes (Signed)
[name] [address], [place] [address] Phone: [phone] Subjective:   [name], am serving as a scribe for Dr. Hulan Saas.  CC: Neck pain follow-up  DJS:[id number]  Jenna [name] is a 53 y.o. female coming in with complaint of upper back and shoulder pain. Patient feels that she is improving. Range motion has improved. Tingling  Patient though states that overall doing much better.  Having very minimal discomfort overall.  Working out on a more regular basis. Patient states that has been doing better with the iron supplementation.     History reviewed. No pertinent past medical history. Past Surgical History:  Procedure Laterality Date  . CERVICAL CERCLAGE     Social History   Socioeconomic History  . Marital status: Divorced    Spouse name: Not on file  . Number of children: Not on file  . Years of education: Not on file  . Highest education level: Not on file  Occupational History  . Not on file  Social Needs  . Financial resource strain: Not on file  . Food insecurity:    Worry: Not on file    Inability: Not on file  . Transportation needs:    Medical: Not on file    Non-medical: Not on file  Tobacco Use  . Smoking status: Never Smoker  . Smokeless tobacco: Never Used  Substance and Sexual Activity  . Alcohol use: Yes    Comment: socially  . Drug use: Never  . Sexual activity: Not on file  Lifestyle  . Physical activity:    Days per week: Not on file    Minutes per session: Not on file  . Stress: Not on file  Relationships  . Social connections:    Talks on phone: Not on file    Gets together: Not on file    Attends religious service: Not on file    Active member of club or organization: Not on file    Attends meetings of clubs or organizations: Not on file    Relationship status: Not on file  Other Topics Concern  . Not on file  Social History Narrative  . Not on file   Allergies  Allergen  Reactions  . Sulfa Antibiotics    Family History  Problem Relation Age of Onset  . Diabetes Other   . Diabetes Father   . Kidney disease Father   . Heart attack Father     Current Outpatient Medications (Endocrine & Metabolic):  .  levonorgestrel (MIRENA, 52 MG,) 20 MCG/24HR IUD,   Current Outpatient Medications (Cardiovascular):  .  nitroGLYCERIN (NITRODUR - DOSED IN MG/24 HR) 0.2 mg/hr patch, 1/4 patch daily  Current Outpatient Medications (Respiratory):  .  promethazine (PHENERGAN) 25 MG tablet, Take 1 tablet (25 mg total) by mouth every 6 (six) hours as needed for nausea or vomiting.  Current Outpatient Medications (Analgesics):  .  meloxicam (MOBIC) 15 MG tablet, Take 1 tablet (15 mg total) by mouth daily as needed.  Current Outpatient Medications (Hematological):  Marland Kitchen  Ferrous Sulfate (IRON) 325 (65 Fe) MG TABS, Take by mouth.  Current Outpatient Medications (Other):  Marland Kitchen  Diclofenac Sodium 2 % SOLN, Place 2 g onto the skin 2 (two) times daily. .  Flaxseed, Linseed, (FLAX SEED OIL) 1000 MG CAPS, Take by mouth. .  gabapentin (NEURONTIN) 100 MG capsule, TAKE 2 CAPSULES BY MOUTH EVERY NIGHT AT BEDTIME .  metaxalone (SKELAXIN) 800 MG tablet, Take 1  tablet (800 mg total) by mouth 3 (three) times daily as needed. .  Misc Natural Products (CALCIUM PYRUVATE PO), Take by mouth. .  Misc Natural Products (TART CHERRY ADVANCED PO), Take by mouth. .  Multiple Vitamin (MULTI-VITAMINS) TABS, Take by mouth. .  Vitamin D, Ergocalciferol, (DRISDOL) 1.25 MG (50000 UT) CAPS capsule, TAKE 1 CAPSULE BY MOUTH EVERY 7 DAYS.    Past medical history, social, surgical and family history all reviewed in electronic medical record.  No pertanent information unless stated regarding to the chief complaint.   Review of Systems:  No  visual changes, nausea, vomiting, diarrhea, constipation, dizziness, abdominal pain, skin rash, fevers, chills, night sweats, weight loss, swollen lymph nodes, body aches,  joint swelling,  chest pain, shortness of breath, mood changes.  Mild positive muscle aches and headaches  Objective  Blood pressure 122/90, pulse 92, height 5\' 8"  (1.727 m), weight 180 lb (81.6 kg), SpO2 98 %.    General: No apparent distress alert and oriented x3 mood and affect normal, dressed appropriately.  HEENT: Pupils equal, extraocular movements intact  Respiratory: Patient's speak in full sentences and does not appear short of breath  Cardiovascular: No lower extremity edema, non tender, no erythema  Skin: Warm dry intact with no signs of infection or rash on extremities or on axial skeleton.  Abdomen: Soft nontender  Neuro: Cranial nerves II through XII are intact, neurovascularly intact in all extremities with 2+ DTRs and 2+ pulses.  Lymph: No lymphadenopathy of posterior or anterior cervical chain or axillae bilaterally.  Gait normal with good balance and coordination.  MSK:  Non tender with full range of motion and good stability and symmetric strength and tone of shoulders, elbows, wrist, hip, knee and ankles bilaterally.  Neck: Inspection loss of lordosis. No palpable stepoffs. Negative Spurling's maneuver. Full neck range of motion Grip strength and sensation normal in bilateral hands Strength good C4 to T1 distribution No sensory change to C4 to T1 Negative Hoffman sign bilaterally Reflexes normal Mild tenderness in the parascapular region of the back bilaterally  Osteopathic findings C2 flexed rotated and side bent right C6 flexed rotated and side bent left T3 extended rotated and side bent right inhaled third rib     Impression and Recommendations:     This case required medical decision making of moderate complexity. The above documentation has been reviewed and is accurate and complete [name], DO       Note: This dictation was prepared with Dragon dictation along with smaller phrase technology. Any transcriptional errors that result from this  process are unintentional.

## 2018-04-22 ENCOUNTER — Other Ambulatory Visit (INDEPENDENT_AMBULATORY_CARE_PROVIDER_SITE_OTHER): Payer: 59

## 2018-04-22 DIAGNOSIS — M2550 Pain in unspecified joint: Secondary | ICD-10-CM

## 2018-04-22 LAB — FERRITIN: Ferritin: 29.4 ng/mL (ref 10.0–291.0)

## 2018-04-25 ENCOUNTER — Encounter: Payer: Self-pay | Admitting: Family Medicine

## 2018-04-25 ENCOUNTER — Ambulatory Visit: Payer: 59 | Admitting: Family Medicine

## 2018-04-25 VITALS — BP 122/90 | HR 92 | Ht 68.0 in | Wt 180.0 lb

## 2018-04-25 DIAGNOSIS — M999 Biomechanical lesion, unspecified: Secondary | ICD-10-CM

## 2018-04-25 DIAGNOSIS — M5030 Other cervical disc degeneration, unspecified cervical region: Secondary | ICD-10-CM

## 2018-04-25 NOTE — Assessment & Plan Note (Signed)
Decision today to treat with OMT was based on Physical Exam  After verbal consent patient was treated with HVLA, ME, FPR techniques in cervical, thoracic, rib areas  Patient tolerated the procedure well with improvement in symptoms  Patient given exercises, stretches and lifestyle modifications  See medications in patient instructions if given  Patient will follow up in 8 weeks 

## 2018-04-25 NOTE — Patient Instructions (Addendum)
Great to see you  I am glad you are doing so well  Keep hands within peripheral vison when you can  Keep being active  If stopping pills do one a week and decrease to 1 time a day first and then see what is the minimum we need  See me again in 2 months!

## 2018-04-25 NOTE — Assessment & Plan Note (Signed)
Known arthritic changes but patient has been doing relatively well with conservative therapy. Responding well to the osteopathic manipulation and ergonomics.  Patient denies any numbness, any radiation of pain to any of the extremities. This patient is well will follow-up with me again in 2 months

## 2018-04-28 DIAGNOSIS — Z23 Encounter for immunization: Secondary | ICD-10-CM | POA: Diagnosis not present

## 2018-05-02 ENCOUNTER — Other Ambulatory Visit: Payer: Self-pay

## 2018-05-02 ENCOUNTER — Encounter: Payer: Self-pay | Admitting: *Deleted

## 2018-05-09 NOTE — Discharge Instructions (Signed)
General Anesthesia, Adult  General anesthesia is the use of medicines to make a person "go to sleep" (unconscious) for a medical procedure. General anesthesia must be used for certain procedures, and is often recommended for procedures that:  Last a long time.  Require you to be still or in an unusual position.  Are major and can cause blood loss.  The medicines used for general anesthesia are called general anesthetics. As well as making you unconscious for a certain amount of time, these medicines:  Prevent pain.  Control your blood pressure.  Relax your muscles.  Tell a health care provider about:  Any allergies you have.  All medicines you are taking, including vitamins, herbs, eye drops, creams, and over-the-counter medicines.  Any problems you or family members have had with anesthetic medicines.  Types of anesthetics you have had in the past.  Any blood disorders you have.  Any surgeries you have had.  Any medical conditions you have.  Any recent upper respiratory, chest, or ear infections.  Any history of:  Heart or lung conditions, such as heart failure, sleep apnea, asthma, or chronic obstructive pulmonary disease (COPD).  Military service.  Depression or anxiety.  Any tobacco or drug use, including marijuana or alcohol use.  Whether you are pregnant or may be pregnant.  What are the risks?  Generally, this is a safe procedure. However, problems may occur, including:  Allergic reaction.  Lung and heart problems.  Inhaling food or liquid from the stomach into the lungs (aspiration).  Nerve injury.  Dental injury.  Air in the bloodstream, which can lead to stroke.  Extreme agitation or confusion (delirium) when you wake up from the anesthetic.  Waking up during your procedure and being unable to move. This is rare.  These problems are more likely to develop if you are having a major surgery or if you have an advanced or serious medical condition. You can prevent some of these complications by answering all  of your health care provider's questions thoroughly and by following all instructions before your procedure.  General anesthesia can cause side effects, including:  Nausea or vomiting.  A sore throat from the breathing tube.  Hoarseness.  Wheezing or coughing.  Shaking chills.  Tiredness.  Body aches.  Anxiety.  Sleepiness or drowsiness.  Confusion or agitation.  What happens before the procedure?  Staying hydrated  Follow instructions from your health care provider about hydration, which may include:  Up to 2 hours before the procedure - you may continue to drink clear liquids, such as water, clear fruit juice, black coffee, and plain tea.    Eating and drinking restrictions  Follow instructions from your health care provider about eating and drinking, which may include:  8 hours before the procedure - stop eating heavy meals or foods such as meat, fried foods, or fatty foods.  6 hours before the procedure - stop eating light meals or foods, such as toast or cereal.  6 hours before the procedure - stop drinking milk or drinks that contain milk.  2 hours before the procedure - stop drinking clear liquids.  Medicines  Ask your health care provider about:  Changing or stopping your regular medicines. This is especially important if you are taking diabetes medicines or blood thinners.  Taking medicines such as aspirin and ibuprofen. These medicines can thin your blood. Do not take these medicines unless your health care provider tells you to take them.  Taking over-the-counter medicines, vitamins, herbs, and   supplements. Do not take these during the week before your procedure unless your health care provider approves them.  General instructions  Starting 3-6 weeks before the procedure, do not use any products that contain nicotine or tobacco, such as cigarettes and e-cigarettes. If you need help quitting, ask your health care provider.  If you brush your teeth on the morning of the procedure, make sure to spit out all  of the toothpaste.  Tell your health care provider if you become ill or develop a cold, cough, or fever.  If instructed by your health care provider, bring your sleep apnea device with you on the day of your surgery (if applicable).  Ask your health care provider if you will be going home the same day, the following day, or after a longer hospital stay.  Plan to have someone take you home from the hospital or clinic.  Plan to have a responsible adult care for you for at least 24 hours after you leave the hospital or clinic. This is important.  What happens during the procedure?    You will be given anesthetics through both of the following:  A mask placed over your nose and mouth.  An IV in one of your veins.  You may receive a medicine to help you relax (sedative).  After you are unconscious, a breathing tube may be inserted down your throat to help you breathe. This will be removed before you wake up.  An anesthesia specialist will stay with you throughout your procedure. He or she will:  Keep you comfortable and safe by continuing to give you medicines and adjusting the amount of medicine that you get.  Monitor your blood pressure, pulse, and oxygen levels to make sure that the anesthetics do not cause any problems.  The procedure may vary among health care providers and hospitals.  What happens after the procedure?  Your blood pressure, temperature, heart rate, breathing rate, and blood oxygen level will be monitored until the medicines you were given have worn off.  You will wake up in a recovery area. You may wake up slowly.  If you feel anxious or agitated, you may be given medicine to help you calm down.  If you will be going home the same day, your health care provider may check to make sure you can walk, drink, and urinate.  Your health care provider will treat any pain or side effects you have before you go home.  Do not drive for 24 hours if you were given a sedative.  Summary  General anesthesia is used  to keep you still and prevent pain during a procedure.  It is important to tell your health care provider about your medical history and any surgeries you have had, and previous experience with anesthesia.  Follow your health care provider's instructions about when to stop eating, drinking, or taking certain medicines before your procedure.  Plan to have someone take you home from the hospital or clinic.  This information is not intended to replace advice given to you by your health care provider. Make sure you discuss any questions you have with your health care provider.  Document Released: 05/26/2007 Document Revised: 07/06/2017 Document Reviewed: 10/02/2016  Elsevier Interactive Patient Education  2019 [employer]

## 2018-05-10 ENCOUNTER — Ambulatory Visit: Payer: 59 | Admitting: Anesthesiology

## 2018-05-10 ENCOUNTER — Encounter
Admission: RE | Disposition: A | Discharge status: Home / Self Care | Payer: Self-pay | Source: Home / Self Care | Attending: Gastroenterology

## 2018-05-10 ENCOUNTER — Ambulatory Visit
Admission: RE | Admit: 2018-05-10 | Discharge: 2018-05-10 | Disposition: A | Discharge status: Home / Self Care | Payer: 59 | Attending: Gastroenterology | Admitting: Gastroenterology

## 2018-05-10 DIAGNOSIS — K635 Polyp of colon: Secondary | ICD-10-CM | POA: Insufficient documentation

## 2018-05-10 DIAGNOSIS — D123 Benign neoplasm of transverse colon: Secondary | ICD-10-CM | POA: Diagnosis not present

## 2018-05-10 DIAGNOSIS — Z791 Long term (current) use of non-steroidal anti-inflammatories (NSAID): Secondary | ICD-10-CM | POA: Insufficient documentation

## 2018-05-10 DIAGNOSIS — Z79899 Other long term (current) drug therapy: Secondary | ICD-10-CM | POA: Diagnosis not present

## 2018-05-10 DIAGNOSIS — Z793 Long term (current) use of hormonal contraceptives: Secondary | ICD-10-CM | POA: Insufficient documentation

## 2018-05-10 DIAGNOSIS — Z882 Allergy status to sulfonamides status: Secondary | ICD-10-CM | POA: Insufficient documentation

## 2018-05-10 DIAGNOSIS — Z1211 Encounter for screening for malignant neoplasm of colon: Secondary | ICD-10-CM | POA: Diagnosis not present

## 2018-05-10 DIAGNOSIS — Z881 Allergy status to other antibiotic agents status: Secondary | ICD-10-CM | POA: Insufficient documentation

## 2018-05-10 HISTORY — DX: Presence of spectacles and contact lenses: Z97.3

## 2018-05-10 HISTORY — PX: POLYPECTOMY: SHX5525

## 2018-05-10 HISTORY — PX: COLONOSCOPY WITH PROPOFOL: SHX5780

## 2018-05-10 HISTORY — DX: Anemia, unspecified: D64.9

## 2018-05-10 LAB — POCT PREGNANCY, URINE: Preg Test, Ur: NEGATIVE

## 2018-05-10 SURGERY — COLONOSCOPY WITH PROPOFOL
Anesthesia: General

## 2018-05-10 MED ORDER — LACTATED RINGERS IV SOLN
INTRAVENOUS | Status: DC
  Administered 2018-05-10: 10:00:00 via INTRAVENOUS

## 2018-05-10 MED ORDER — LIDOCAINE HCL (CARDIAC) PF 100 MG/5ML IV SOSY
PREFILLED_SYRINGE | INTRAVENOUS | Status: DC | PRN
  Administered 2018-05-10: 40 mg via INTRAVENOUS

## 2018-05-10 MED ORDER — PROPOFOL 10 MG/ML IV BOLUS
INTRAVENOUS | Status: DC | PRN
  Administered 2018-05-10 (×6): 50 mg via INTRAVENOUS

## 2018-05-10 SURGICAL SUPPLY — 25 items
CANISTER SUCT 1200ML W/VALVE (MISCELLANEOUS) ×3 IMPLANT
CLIP HMST 235XBRD CATH ROT (MISCELLANEOUS) IMPLANT
CLIP RESOLUTION 360 11X235 (MISCELLANEOUS)
ELECT REM PT RETURN 9FT ADLT (ELECTROSURGICAL)
ELECTRODE REM PT RTRN 9FT ADLT (ELECTROSURGICAL) IMPLANT
FCP ESCP3.2XJMB 240X2.8X (MISCELLANEOUS)
FORCEPS BIOP RAD 4 LRG CAP 4 (CUTTING FORCEPS) IMPLANT
FORCEPS BIOP RJ4 240 W/NDL (MISCELLANEOUS)
FORCEPS ESCP3.2XJMB 240X2.8X (MISCELLANEOUS) IMPLANT
GOWN CVR UNV OPN BCK APRN NK (MISCELLANEOUS) ×4 IMPLANT
GOWN ISOL THUMB LOOP REG UNIV (MISCELLANEOUS) ×2
INJECTOR VARIJECT VIN23 (MISCELLANEOUS) IMPLANT
KIT DEFENDO VALVE AND CONN (KITS) IMPLANT
KIT ENDO PROCEDURE OLY (KITS) ×3 IMPLANT
MARKER SPOT ENDO TATTOO 5ML (MISCELLANEOUS) IMPLANT
PROBE APC STR FIRE (PROBE) IMPLANT
RETRIEVER NET ROTH 2.5X230 LF (MISCELLANEOUS) IMPLANT
SNARE COLD EXACTO (MISCELLANEOUS) ×3 IMPLANT
SNARE SHORT THROW 13M SML OVAL (MISCELLANEOUS) IMPLANT
SNARE SHORT THROW 30M LRG OVAL (MISCELLANEOUS) IMPLANT
SNARE SNG USE RND 15MM (INSTRUMENTS) IMPLANT
SPOT EX ENDOSCOPIC TATTOO (MISCELLANEOUS)
TRAP ETRAP POLY (MISCELLANEOUS) ×3 IMPLANT
VARIJECT INJECTOR VIN23 (MISCELLANEOUS)
WATER STERILE IRR 250ML POUR (IV SOLUTION) ×3 IMPLANT

## 2018-05-10 NOTE — Anesthesia Preprocedure Evaluation (Signed)
Anesthesia Evaluation  Patient identified by MRN, date of birth, ID band Patient awake    Reviewed: Allergy & Precautions, H&P , NPO status , Patient's Chart, lab work & pertinent test results, reviewed documented beta blocker date and time   Airway Mallampati: II  TM Distance: >3 FB Neck ROM: full    Dental no notable dental hx.    Pulmonary neg pulmonary ROS,    Pulmonary exam normal breath sounds clear to auscultation       Cardiovascular Exercise Tolerance: Good negative cardio ROS   Rhythm:regular Rate:Normal     Neuro/Psych negative neurological ROS  negative psych ROS   GI/Hepatic negative GI ROS, Neg liver ROS,   Endo/Other  negative endocrine ROS  Renal/GU negative Renal ROS  negative genitourinary   Musculoskeletal   Abdominal   Peds  Hematology  (+) Blood dyscrasia, anemia ,   Anesthesia Other Findings   Reproductive/Obstetrics negative OB ROS                             Anesthesia Physical Anesthesia Plan  ASA: II  Anesthesia Plan: General   Post-op Pain Management:    Induction:   PONV Risk Score and Plan:   Airway Management Planned:   Additional Equipment:   Intra-op Plan:   Post-operative Plan:   Informed Consent: I have reviewed the patients History and Physical, chart, labs and discussed the procedure including the risks, benefits and alternatives for the proposed anesthesia with the patient or authorized representative who has indicated his/her understanding and acceptance.     Dental Advisory Given  Plan Discussed with: CRNA  Anesthesia Plan Comments:         Anesthesia Quick Evaluation

## 2018-05-10 NOTE — Op Note (Signed)
[facility] Patient Name: Jenna [name] Procedure Date: 05/10/2018 10:38 AM MRN: [id number] Account #: [id number] Date of Birth: [birth year] Admit Type: Outpatient Age: 53 Room: [facility] OR ROOM 01 Gender: Female Note Status: Finalized Procedure:            Colonoscopy Indications:          Screening for colorectal malignant neoplasm Providers:             B. Bonna Gains MD, MD Referring MD:         Charolotte Eke. Foley (Referring MD) Medicines:            Monitored Anesthesia Care Complications:        No immediate complications. Procedure:            Pre-Anesthesia Assessment:                       - ASA Grade Assessment: II - A patient with mild                        systemic disease.                       - Prior to the procedure, a History and Physical was                        performed, and patient medications, allergies and                        sensitivities were reviewed. The patient's tolerance of                        previous anesthesia was reviewed.                       - The risks and benefits of the procedure and the                        sedation options and risks were discussed with the                        patient. All questions were answered and informed                        consent was obtained.                       - Patient identification and proposed procedure were                        verified prior to the procedure by the physician, the                        nurse, the anesthesiologist, the anesthetist and the                        technician. The procedure was verified in the procedure                        room.  After obtaining informed consent, the colonoscope was                        passed under direct vision. Throughout the procedure,                        the patient's blood pressure, pulse, and oxygen                        saturations were monitored continuously. The was                         introduced through the anus and advanced to the the                        cecum, identified by appendiceal orifice and ileocecal                        valve. The colonoscopy was performed with ease. The                        patient tolerated the procedure well. The quality of                        the bowel preparation was good. Findings:      The perianal and digital rectal examinations were normal.      A 4 mm polyp was found in the transverse colon. The polyp was sessile.       The polyp was removed with a cold snare. Resection and retrieval were       complete.      The exam was otherwise without abnormality.      The rectum, sigmoid colon, descending colon, transverse colon, ascending       colon and cecum appeared normal.      The retroflexed view of the distal rectum and anal verge was normal and       showed no anal or rectal abnormalities. Impression:           - One 4 mm polyp in the transverse colon, removed with                        a cold snare. Resected and retrieved.                       - The examination was otherwise normal.                       - The rectum, sigmoid colon, descending colon,                        transverse colon, ascending colon and cecum are normal.                       - The distal rectum and anal verge are normal on                        retroflexion view. Recommendation:       - Discharge patient to home (with escort).                       -  Advance diet as tolerated.                       - Continue present medications.                       - Await pathology results.                       - The findings and recommendations were discussed with                        the patient.                       - The findings and recommendations were discussed with                        the patient's family.                       - Return to primary care physician as previously                        scheduled.                        - If the pathology report reveals adenomatous tissue,                        then repeat the colonoscopy for surveillance in 5 years.                       - If the pathology report indicates hyperplastic polyp,                        then repeat colonoscopy for screening purposes in 10                        years.                       - In the future, if patient develops new symptoms such                        as blood per rectum, abdominal pain, weight loss,                        altered bowel habits or any other reason for concern,                        patient should discuss this with thier PCP as they may                        need a GI referral at that time or evaluation for need                        for colonoscopy earlier than the recommended screening                        colonoscopy.  In addition, if patient's family history of colon                        cancer changes (no family history at this time) in the                        future, earlier screening may be indicated and patient                        should discuss this with PCP as well. Procedure Code(s):    --- Professional ---                       [phone], Colonoscopy, flexible; with removal of tumor(s),                        polyp(s), or other lesion(s) by snare technique Diagnosis Code(s):    --- Professional ---                       Z12.11, Encounter for screening for malignant neoplasm                        of colon                       D12.3, Benign neoplasm of transverse colon (hepatic                        flexure or splenic flexure) CPT copyright 2018 American Medical Association. All rights reserved. The codes documented in this report are preliminary and upon coder review may  be revised to meet current compliance requirements.  [name], MD Margretta Sidle B. Bonna Gains MD, MD 05/10/2018 11:10:47 AM This report has been signed electronically. Number of  Addenda: 0 Note Initiated On: 05/10/2018 10:38 AM Scope Withdrawal Time: 0 hours 13 minutes 31 seconds  Total Procedure Duration: 0 hours 16 minutes 59 seconds  Estimated Blood Loss: Estimated blood loss: none.      [facility]

## 2018-05-10 NOTE — H&P (Signed)
Jenna Antigua, MD [address] [place], [place], [place], [address] [address], [place], [place], [place], [address] Phone: [phone]  Fax: [phone]  Primary Care Physician:  Fritzi Mandes, MD   Pre-Procedure History & Physical: HPI:  Jenna [name] is a 53 y.o. female is here for a colonoscopy.   Past Medical History:  Diagnosis Date  . Anemia   . Wears contact lenses     Past Surgical History:  Procedure Laterality Date  . CERVICAL CERCLAGE      Prior to Admission medications   Medication Sig Start Date End Date Taking? Authorizing Provider  Ascorbic Acid (VITAMIN C PO) Take by mouth daily.   Yes [provider]  BIOTIN PO Take by mouth daily.   Yes [provider]  Diclofenac Sodium 2 % SOLN Place 2 g onto the skin 2 (two) times daily. 09/22/17  Yes [name], DO  Ferrous Sulfate (IRON) 325 (65 Fe) MG TABS Take by mouth.   Yes [provider]  Flaxseed, Linseed, (FLAX SEED OIL) 1000 MG CAPS Take by mouth. 08/27/11  Yes [provider]  gabapentin (NEURONTIN) 100 MG capsule TAKE 2 CAPSULES BY MOUTH EVERY NIGHT AT BEDTIME 03/09/18  Yes [name], DO  levonorgestrel (MIRENA, 52 MG,) 20 MCG/24HR IUD  12/26/13  Yes [provider]  meloxicam (MOBIC) 15 MG tablet Take 1 tablet (15 mg total) by mouth daily as needed. 02/03/18  Yes Cook, Jayce G, DO  metaxalone (SKELAXIN) 800 MG tablet Take 1 tablet (800 mg total) by mouth 3 (three) times daily as needed. 02/03/18  Yes Cook, Barnie Del, DO  Misc Natural Products (CALCIUM PYRUVATE PO) Take by mouth.   Yes [provider]  Misc Natural Products (TART CHERRY ADVANCED PO) Take by mouth.   Yes [provider]  Multiple Vitamin (MULTI-VITAMINS) TABS Take by mouth. 08/27/11  Yes [provider]  OIL OF OREGANO PO Take by mouth daily.   Yes [provider]  Vitamin D, Ergocalciferol, (DRISDOL) 1.25 MG (50000 UT) CAPS capsule TAKE 1  CAPSULE BY MOUTH EVERY 7 DAYS. 03/09/18  Yes [name], DO  promethazine (PHENERGAN) 25 MG tablet Take 1 tablet (25 mg total) by mouth every 6 (six) hours as needed for nausea or vomiting. Patient not taking: Reported on 05/02/2018 08/16/17   [name], DO    Allergies as of 04/13/2018 - Review Complete 02/03/2018  Allergen Reaction Noted  . Sulfa antibiotics  10/01/2015    Family History  Problem Relation Age of Onset  . Diabetes Other   . Diabetes Father   . Kidney disease Father   . Heart attack Father     Social History   Socioeconomic History  . Marital status: Divorced    Spouse name: Not on file  . Number of children: Not on file  . Years of education: Not on file  . Highest education level: Not on file  Occupational History  . Not on file  Social Needs  . Financial resource strain: Not on file  . Food insecurity:    Worry: Not on file    Inability: Not on file  . Transportation needs:    Medical: Not on file    Non-medical: Not on file  Tobacco Use  . Smoking status: Never Smoker  . Smokeless tobacco: Never Used  Substance and Sexual Activity  . Alcohol use: Yes    Comment: socially - couple x/year  . Drug  use: Never  . Sexual activity: Not on file  Lifestyle  . Physical activity:    Days per week: Not on file    Minutes per session: Not on file  . Stress: Not on file  Relationships  . Social connections:    Talks on phone: Not on file    Gets together: Not on file    Attends religious service: Not on file    Active member of club or organization: Not on file    Attends meetings of clubs or organizations: Not on file    Relationship status: Not on file  . Intimate partner violence:    Fear of current or ex partner: Not on file    Emotionally abused: Not on file    Physically abused: Not on file    Forced sexual activity: Not on file  Other Topics Concern  . Not on file  Social History Narrative  . Not on file    Review of Systems: See  HPI, otherwise negative ROS  Physical Exam: BP 134/78   Pulse 93   Temp (!) 97 F (36.1 C)   Resp 12   Ht 5\' 8"  (1.727 m)   Wt 81.6 kg   SpO2 100%   BMI 27.35 kg/m  General:   Alert,  pleasant and cooperative in NAD Head:  Normocephalic and atraumatic. Neck:  Supple; no masses or thyromegaly. Lungs:  Clear throughout to auscultation, normal respiratory effort.    Heart:  +S1, +S2, Regular rate and rhythm, No edema. Abdomen:  Soft, nontender and nondistended. Normal bowel sounds, without guarding, and without rebound.   Neurologic:  Alert and  oriented x4;  grossly normal neurologically.  Impression/Plan: Jenna [name] is here for a colonoscopy to be performed for average risk screening.  Risks, benefits, limitations, and alternatives regarding  colonoscopy have been reviewed with the patient.  Questions have been answered.  All parties agreeable.   Jenna Manifold, MD  05/10/2018, 10:11 AM

## 2018-05-10 NOTE — Anesthesia Postprocedure Evaluation (Signed)
Anesthesia Post Note  Patient: Jenna [name]  Procedure(s) Performed: COLONOSCOPY WITH PROPOFOL (N/A ) POLYPECTOMY  Patient location during evaluation: PACU Anesthesia Type: General Level of consciousness: awake and alert Pain management: pain level controlled Vital Signs Assessment: post-procedure vital signs reviewed and stable Respiratory status: spontaneous breathing, nonlabored ventilation, respiratory function stable and patient connected to nasal cannula oxygen Cardiovascular status: blood pressure returned to baseline and stable Postop Assessment: no apparent nausea or vomiting Anesthetic complications: no    Alisa Graff

## 2018-05-10 NOTE — Transfer of Care (Signed)
Immediate Anesthesia Transfer of Care Note  Patient: Jenna [name]  Procedure(s) Performed: COLONOSCOPY WITH PROPOFOL (N/A ) POLYPECTOMY  Patient Location: PACU  Anesthesia Type: General  Level of Consciousness: awake, alert  and patient cooperative  Airway and Oxygen Therapy: Patient Spontanous Breathing and Patient connected to supplemental oxygen  Post-op Assessment: Post-op Vital signs reviewed, Patient's Cardiovascular Status Stable, Respiratory Function Stable, Patent Airway and No signs of Nausea or vomiting  Post-op Vital Signs: Reviewed and stable  Complications: No apparent anesthesia complications

## 2018-05-11 ENCOUNTER — Encounter: Payer: Self-pay | Admitting: Gastroenterology

## 2018-05-12 ENCOUNTER — Encounter: Payer: Self-pay | Admitting: Gastroenterology

## 2018-05-16 ENCOUNTER — Encounter: Payer: Self-pay | Admitting: Gastroenterology

## 2018-05-18 ENCOUNTER — Encounter: Payer: Self-pay | Admitting: Family Medicine

## 2018-05-19 ENCOUNTER — Other Ambulatory Visit: Payer: Self-pay | Admitting: Family Medicine

## 2018-06-21 NOTE — Progress Notes (Signed)
[name] [address] [place], [place] [address] Phone: [phone] Subjective:      CC: Back pain and neck pain follow-up  [id number]  Jenna [name] is a 53 y.o. female coming in with complaint of neck pain.  Known degenerative disc disease of the cervical spine.  Discussed with patient about home exercise, icing regimen which she has been doing occasionally.  Work environment has changed and patient is working more at home at this time.  Ergonomics and no standing desk has not been fitted.  Patient states some more tension in the neck than usual.  Has been longer since previous exam as well.     Past Medical History:  Diagnosis Date  . Anemia   . Wears contact lenses    Past Surgical History:  Procedure Laterality Date  . CERVICAL CERCLAGE    . COLONOSCOPY WITH PROPOFOL N/A 05/10/2018   Procedure: COLONOSCOPY WITH PROPOFOL;  Surgeon: Virgel Manifold, MD;  Location: [facility];  Service: Endoscopy;  Laterality: N/A;  . POLYPECTOMY  05/10/2018   Procedure: POLYPECTOMY;  Surgeon: Virgel Manifold, MD;  Location: [facility];  Service: Endoscopy;;   Social History   Socioeconomic History  . Marital status: Divorced    Spouse name: Not on file  . Number of children: Not on file  . Years of education: Not on file  . Highest education level: Not on file  Occupational History  . Not on file  Social Needs  . Financial resource strain: Not on file  . Food insecurity:    Worry: Not on file    Inability: Not on file  . Transportation needs:    Medical: Not on file    Non-medical: Not on file  Tobacco Use  . Smoking status: Never Smoker  . Smokeless tobacco: Never Used  Substance and Sexual Activity  . Alcohol use: Yes    Comment: socially - couple x/year  . Drug use: Never  . Sexual activity: Not on file  Lifestyle  . Physical activity:    Days per week: Not on file    Minutes per session: Not on file  .  Stress: Not on file  Relationships  . Social connections:    Talks on phone: Not on file    Gets together: Not on file    Attends religious service: Not on file    Active member of club or organization: Not on file    Attends meetings of clubs or organizations: Not on file    Relationship status: Not on file  Other Topics Concern  . Not on file  Social History Narrative  . Not on file   Allergies  Allergen Reactions  . Sulfa Antibiotics Diarrhea and Nausea And Vomiting   Family History  Problem Relation Age of Onset  . Diabetes Other   . Diabetes Father   . Kidney disease Father   . Heart attack Father     Current Outpatient Medications (Endocrine & Metabolic):  .  levonorgestrel (MIRENA, 52 MG,) 20 MCG/24HR IUD,    Current Outpatient Medications (Respiratory):  .  promethazine (PHENERGAN) 25 MG tablet, Take 1 tablet (25 mg total) by mouth every 6 (six) hours as needed for nausea or vomiting.  Current Outpatient Medications (Analgesics):  .  meloxicam (MOBIC) 15 MG tablet, Take 1 tablet (15 mg total) by mouth daily as needed.  Current Outpatient Medications (Hematological):  Marland Kitchen  Ferrous Sulfate (IRON) 325 (65 Fe) MG  TABS, Take by mouth.  Current Outpatient Medications (Other):  Marland Kitchen  Ascorbic Acid (VITAMIN C PO), Take by mouth daily. Marland Kitchen  BIOTIN PO, Take by mouth daily. .  Diclofenac Sodium 2 % SOLN, Place 2 g onto the skin 2 (two) times daily. .  Flaxseed, Linseed, (FLAX SEED OIL) 1000 MG CAPS, Take by mouth. .  gabapentin (NEURONTIN) 100 MG capsule, TAKE 2 CAPSULES BY MOUTH EVERY NIGHT AT BEDTIME .  metaxalone (SKELAXIN) 800 MG tablet, Take 1 tablet (800 mg total) by mouth 3 (three) times daily as needed. .  Misc Natural Products (CALCIUM PYRUVATE PO), Take by mouth. .  Misc Natural Products (TART CHERRY ADVANCED PO), Take by mouth. .  Multiple Vitamin (MULTI-VITAMINS) TABS, Take by mouth. .  OIL OF OREGANO PO, Take by mouth daily. .  Vitamin D, Ergocalciferol, (DRISDOL)  1.25 MG (50000 UT) CAPS capsule, TAKE 1 CAPSULE BY MOUTH EVERY 7 DAYS.    Past medical history, social, surgical and family history all reviewed in electronic medical record.  No pertanent information unless stated regarding to the chief complaint.   Review of Systems:  No visual changes, nausea, vomiting, diarrhea, constipation, dizziness, abdominal pain, skin rash, fevers, chills, night sweats, weight loss, swollen lymph nodes, body aches, joint swelling,, chest pain, shortness of breath, mood changes.  Positive muscle aches and headaches  Objective  Blood pressure 128/86, pulse 85, height 5\' 8"  (1.727 m), weight 183 lb (83 kg), SpO2 98 %.  General: No apparent distress alert and oriented x3 mood and affect normal, dressed appropriately.  Patient is wearing a surgical mask HEENT: Pupils equal, extraocular movements intact  Respiratory: Patient's speak in full sentences and does not appear short of breath  Cardiovascular: No lower extremity edema, non tender, no erythema  Skin: Warm dry intact with no signs of infection or rash on extremities or on axial skeleton.  Abdomen: Soft nontender  Neuro: Cranial nerves II through XII are intact, neurovascularly intact in all extremities with 2+ DTRs and 2+ pulses.  Lymph: No lymphadenopathy of posterior or anterior cervical chain or axillae bilaterally.  Gait normal with good balance and coordination.  MSK:  Non tender with full range of motion and good stability and symmetric strength and tone of shoulders, elbows, wrist, hip, knee and ankles bilaterally.  Neck exam shows patient does have some loss of lordosis.  Some mild limited range of motion in sidebending bilaterally. Back Exam:  Inspection: Unremarkable  Motion: Flexion 40 deg, Extension 25 deg, Side Bending to 35 deg bilaterally, Rotation to 30 deg bilaterally  SLR laying: Negative  XSLR laying: Negative  Palpable tenderness: Tender to palpation in the paraspinal musculature. FABER:  negative. Sensory change: Gross sensation intact to all lumbar and sacral dermatomes.  Reflexes: 2+ at both patellar tendons, 2+ at achilles tendons, Babinski's downgoing.  Strength at foot  Plantar-flexion: 5/5 Dorsi-flexion: 5/5 Eversion: 5/5 Inversion: 5/5  Leg strength  Quad: 5/5 Hamstring: 5/5 Hip flexor: 5/5 Hip abductors: 5/5  Gait unremarkable.  Neck: Inspection loss of lordosis. No palpable stepoffs. Negative Spurling's maneuver. Range of motion limited sidebending Grip strength and sensation normal in bilateral hands Strength good C4 to T1 distribution No sensory change to C4 to T1 Negative Hoffman sign bilaterally Reflexes normal  Osteopathic findings  C2 flexed rotated and side bent right C6 flexed rotated and side bent left T3 extended rotated and side bent right inhaled third rib T9 extended rotated and side bent left L4 flexed rotated and side bent left  Sacrum right on right    Impression and Recommendations:     This case required medical decision making of moderate complexity. The above documentation has been reviewed and is accurate and complete [name], DO       Note: This dictation was prepared with Dragon dictation along with smaller phrase technology. Any transcriptional errors that result from this process are unintentional.

## 2018-06-22 ENCOUNTER — Encounter: Payer: Self-pay | Admitting: Family Medicine

## 2018-06-23 ENCOUNTER — Other Ambulatory Visit: Payer: Self-pay

## 2018-06-23 ENCOUNTER — Encounter: Payer: Self-pay | Admitting: Family Medicine

## 2018-06-23 ENCOUNTER — Ambulatory Visit: Payer: 59 | Admitting: Family Medicine

## 2018-06-23 VITALS — BP 128/86 | HR 85 | Ht 68.0 in | Wt 183.0 lb

## 2018-06-23 DIAGNOSIS — M5030 Other cervical disc degeneration, unspecified cervical region: Secondary | ICD-10-CM | POA: Diagnosis not present

## 2018-06-23 DIAGNOSIS — M999 Biomechanical lesion, unspecified: Secondary | ICD-10-CM | POA: Diagnosis not present

## 2018-06-23 NOTE — Patient Instructions (Signed)
Good to see you  Please be safe Keep doing he exercises  Work on posture and be mindful of ergonomics when working from home.  See me again in 4-8 weeks!

## 2018-06-23 NOTE — Assessment & Plan Note (Signed)
Known degenerative disc disease.  We discussed ergonomics.  Patient is working at home more and I think this is likely contributing.  We discussed keeping the monitor at eye level.  Discussed the scapular exercises.  Follow-up again in 4 weeks

## 2018-06-23 NOTE — Assessment & Plan Note (Signed)
Decision today to treat with OMT was based on Physical Exam  After verbal consent patient was treated with HVLA, ME, FPR techniques in cervical, thoracic, rib areas  Patient tolerated the procedure well with improvement in symptoms  Patient given exercises, stretches and lifestyle modifications  See medications in patient instructions if given  Patient will follow up in 4-8 weeks 

## 2018-07-20 NOTE — Progress Notes (Signed)
[name] [address] [place], [place] [address] Phone: [phone] Subjective:   Jenna [name], am serving as a scribe for Dr. Hulan Saas.   CC: Back pain follow-up  [id number]    06/23/18: Known degenerative disc disease.  We discussed ergonomics.  Patient is working at home more and I think this is likely contributing.  We discussed keeping the monitor at eye level.  Discussed the scapular exercises.  Follow-up again in 4 weeks   Update 07/21/2018: Jenna [name] is a 53 y.o. female coming in with complaint of neck and B shoulder pain (L>R).  Pt rates her current symptoms as mild-mod in her neck and into her B shoulders.  She con't to work from home which is an aggravating factor.  She reports having raised her computer monitor and has been taking more frequent rest breaks while working.  She has been doing her HEP daily.  Has been having right hip pain for 2 days. Has had pain before and was found to have low iron. Does take iron supplement daily. Pain with sitting and walking. Pain is located over right SI joint. Did start walking last and is going up and down the stairs more.   Onset-  Location- Neck and B shoulders (L>r) Character-aching sensation Aggravating factors- computer work; posture Reliving factors-  Therapies tried- Tart cherry and gabapentin Severity-Mild-moderate     Past Medical History:  Diagnosis Date  . Anemia   . Wears contact lenses    Past Surgical History:  Procedure Laterality Date  . CERVICAL CERCLAGE    . COLONOSCOPY WITH PROPOFOL N/A 05/10/2018   Procedure: COLONOSCOPY WITH PROPOFOL;  Surgeon: Virgel Manifold, MD;  Location: [facility];  Service: Endoscopy;  Laterality: N/A;  . POLYPECTOMY  05/10/2018   Procedure: POLYPECTOMY;  Surgeon: Virgel Manifold, MD;  Location: [facility];  Service: Endoscopy;;   Social History   Socioeconomic History  . Marital status: Divorced   Spouse name: Not on file  . Number of children: Not on file  . Years of education: Not on file  . Highest education level: Not on file  Occupational History  . Not on file  Social Needs  . Financial resource strain: Not on file  . Food insecurity:    Worry: Not on file    Inability: Not on file  . Transportation needs:    Medical: Not on file    Non-medical: Not on file  Tobacco Use  . Smoking status: Never Smoker  . Smokeless tobacco: Never Used  Substance and Sexual Activity  . Alcohol use: Yes    Comment: socially - couple x/year  . Drug use: Never  . Sexual activity: Not on file  Lifestyle  . Physical activity:    Days per week: Not on file    Minutes per session: Not on file  . Stress: Not on file  Relationships  . Social connections:    Talks on phone: Not on file    Gets together: Not on file    Attends religious service: Not on file    Active member of club or organization: Not on file    Attends meetings of clubs or organizations: Not on file    Relationship status: Not on file  Other Topics Concern  . Not on file  Social History Narrative  . Not on file   Allergies  Allergen Reactions  . Sulfa Antibiotics Diarrhea and Nausea And Vomiting  Family History  Problem Relation Age of Onset  . Diabetes Other   . Diabetes Father   . Kidney disease Father   . Heart attack Father     Current Outpatient Medications (Endocrine & Metabolic):  .  levonorgestrel (MIRENA, 52 MG,) 20 MCG/24HR IUD,    Current Outpatient Medications (Respiratory):  .  promethazine (PHENERGAN) 25 MG tablet, Take 1 tablet (25 mg total) by mouth every 6 (six) hours as needed for nausea or vomiting.  Current Outpatient Medications (Analgesics):  .  meloxicam (MOBIC) 15 MG tablet, Take 1 tablet (15 mg total) by mouth daily as needed.  Current Outpatient Medications (Hematological):  Marland Kitchen  Ferrous Sulfate (IRON) 325 (65 Fe) MG TABS, Take by mouth.  Current Outpatient Medications  (Other):  Marland Kitchen  Ascorbic Acid (VITAMIN C PO), Take by mouth daily. Marland Kitchen  BIOTIN PO, Take by mouth daily. .  Diclofenac Sodium 2 % SOLN, Place 2 g onto the skin 2 (two) times daily. .  Flaxseed, Linseed, (FLAX SEED OIL) 1000 MG CAPS, Take by mouth. .  gabapentin (NEURONTIN) 100 MG capsule, TAKE 2 CAPSULES BY MOUTH EVERY NIGHT AT BEDTIME .  metaxalone (SKELAXIN) 800 MG tablet, Take 1 tablet (800 mg total) by mouth 3 (three) times daily as needed. .  Misc Natural Products (CALCIUM PYRUVATE PO), Take by mouth. .  Misc Natural Products (TART CHERRY ADVANCED PO), Take by mouth. .  Multiple Vitamin (MULTI-VITAMINS) TABS, Take by mouth. .  OIL OF OREGANO PO, Take by mouth daily. .  Vitamin D, Ergocalciferol, (DRISDOL) 1.25 MG (50000 UT) CAPS capsule, TAKE 1 CAPSULE BY MOUTH EVERY 7 DAYS.    Past medical history, social, surgical and family history all reviewed in electronic medical record.  [name] unless stated regarding to the chief complaint.   Review of Systems:  [name]  visual changes, nausea, vomiting, diarrhea, constipation, dizziness, abdominal pain, skin rash, fevers, chills, night sweats, weight loss, swollen lymph nodes, body aches, joint swelling, chest pain, shortness of breath, mood changes.  Positive muscle aches mild headaches  Objective  Blood pressure 126/84, pulse 92, height 5\' 8"  (1.727 m), weight 183 lb (83 kg), SpO2 94 %.   General: [name] mood and affect normal, dressed appropriately.  HEENT: Pupils equal, extraocular movements intact  Respiratory: Patient's speak in full sentences and does not appear short of breath  Cardiovascular: [name] lower extremity edema, non tender, [name] erythema  Skin: Warm dry intact with [name] signs of infection or rash on extremities or on axial skeleton.  Abdomen: Soft nontender  Neuro: Cranial nerves II through XII are intact, neurovascularly intact in all extremities with 2+ DTRs and 2+ pulses.  Lymph: [name]  lymphadenopathy of posterior or anterior cervical chain or axillae bilaterally.  Gait normal with good balance and coordination.  MSK:  Non tender with full range of motion and good stability and symmetric strength and tone of shoulders, elbows, wrist, hip, knee and ankles bilaterally.  Patient neck exam has some mild loss of lordosis and severe tenderness to palpation over the right sacroiliac joint.  This is different than previous exams.  Positive Corky Sox.  Negative straight leg test.  Tender to palpation paraspinal musculature also of the lumbar spine on the right side.  Osteopathic findings  C2 flexed rotated and side bent right C4 flexed rotated and side bent leftt T3 extended rotated and side bent right inhaled third rib T9 extended rotated and side bent left L3 flexed rotated  and side bent left Sacrum right on right    Impression and Recommendations:     This case required medical decision making of moderate complexity. The above documentation has been reviewed and is accurate and complete [name], DO       Note: This dictation was prepared with Dragon dictation along with smaller phrase technology. Any transcriptional errors that result from this process are unintentional.

## 2018-07-21 ENCOUNTER — Ambulatory Visit (INDEPENDENT_AMBULATORY_CARE_PROVIDER_SITE_OTHER): Payer: 59 | Admitting: Family Medicine

## 2018-07-21 ENCOUNTER — Encounter: Payer: Self-pay | Admitting: Family Medicine

## 2018-07-21 ENCOUNTER — Other Ambulatory Visit: Payer: Self-pay

## 2018-07-21 VITALS — BP 126/84 | HR 92 | Ht 68.0 in | Wt 183.0 lb

## 2018-07-21 DIAGNOSIS — M5030 Other cervical disc degeneration, unspecified cervical region: Secondary | ICD-10-CM

## 2018-07-21 DIAGNOSIS — M999 Biomechanical lesion, unspecified: Secondary | ICD-10-CM

## 2018-07-21 DIAGNOSIS — M533 Sacrococcygeal disorders, not elsewhere classified: Secondary | ICD-10-CM | POA: Diagnosis not present

## 2018-07-21 MED ORDER — MELOXICAM 15 MG PO TABS: 15.0000 mg | ORAL_TABLET | Freq: Every day | ORAL | 0 refills | Status: DC | PRN

## 2018-07-21 NOTE — Assessment & Plan Note (Signed)
Decision today to treat with OMT was based on Physical Exam  After verbal consent patient was treated with HVLA, ME, FPR techniques in cervical, thoracic, rib lumbar and sacral areas  Patient tolerated the procedure well with improvement in symptoms  Patient given exercises, stretches and lifestyle modifications  See medications in patient instructions if given  Patient will follow up in 6 weeks 

## 2018-07-21 NOTE — Patient Instructions (Signed)
Good to see you  Ice is your friend Stay active Ice 20 minutes 2 times daily. Usually after activity and before bed. Exercises 3 times a week.  meloxicam daily for 3 days and see how you feel  If worsening give me a call  Or I will see you again in 3-4 weeks

## 2018-07-21 NOTE — Assessment & Plan Note (Signed)
Known degenerative disc disease of the neck as well.  Does have some tightness.  Discussed with patient again at great length.  We discussed home exercises icing regimen.  More tenderness over the sacroiliac joint will monitor closely.  Follow-up again in 4 to 8 weeks

## 2018-08-17 ENCOUNTER — Ambulatory Visit: Payer: 59 | Admitting: Family Medicine

## 2018-08-17 ENCOUNTER — Encounter: Payer: Self-pay | Admitting: Family Medicine

## 2018-08-17 ENCOUNTER — Other Ambulatory Visit: Payer: Self-pay

## 2018-08-17 VITALS — BP 110/74 | HR 84 | Ht 68.0 in

## 2018-08-17 DIAGNOSIS — M999 Biomechanical lesion, unspecified: Secondary | ICD-10-CM | POA: Diagnosis not present

## 2018-08-17 DIAGNOSIS — M5030 Other cervical disc degeneration, unspecified cervical region: Secondary | ICD-10-CM

## 2018-08-17 NOTE — Assessment & Plan Note (Signed)
Degenerative joint disease.  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  Follow-up again in 4 to 8 weeks

## 2018-08-17 NOTE — Progress Notes (Signed)
[name] [address] [place], [place] [address] Phone: [phone] Subjective:   Jenna [name], am serving as a scribe for Dr. Hulan Saas.   CC: Neck and back pain follow-up  [id number]  Jenna [name] is a 53 y.o. female coming in with complaint of  Back pain and neck pain.  Has been seen multiple times for more of osteopathic manipulation.  Having a little bit increase more discomfort and pain with working from home.     Past Medical History:  Diagnosis Date  . Anemia   . Wears contact lenses    Past Surgical History:  Procedure Laterality Date  . CERVICAL CERCLAGE    . COLONOSCOPY WITH PROPOFOL N/A 05/10/2018   Procedure: COLONOSCOPY WITH PROPOFOL;  Surgeon: Virgel Manifold, MD;  Location: [facility];  Service: Endoscopy;  Laterality: N/A;  . POLYPECTOMY  05/10/2018   Procedure: POLYPECTOMY;  Surgeon: Virgel Manifold, MD;  Location: [facility];  Service: Endoscopy;;   Social History   Socioeconomic History  . Marital status: Divorced    Spouse name: Not on file  . Number of children: Not on file  . Years of education: Not on file  . Highest education level: Not on file  Occupational History  . Not on file  Social Needs  . Financial resource strain: Not on file  . Food insecurity    Worry: Not on file    Inability: Not on file  . Transportation needs    Medical: Not on file    Non-medical: Not on file  Tobacco Use  . Smoking status: Never Smoker  . Smokeless tobacco: Never Used  Substance and Sexual Activity  . Alcohol use: Yes    Comment: socially - couple x/year  . Drug use: Never  . Sexual activity: Not on file  Lifestyle  . Physical activity    Days per week: Not on file    Minutes per session: Not on file  . Stress: Not on file  Relationships  . Social Herbalist on phone: Not on file    Gets together: Not on file    Attends religious service: Not on file    Active  member of club or organization: Not on file    Attends meetings of clubs or organizations: Not on file    Relationship status: Not on file  Other Topics Concern  . Not on file  Social History Narrative  . Not on file   Allergies  Allergen Reactions  . Sulfa Antibiotics Diarrhea and Nausea And Vomiting   Family History  Problem Relation Age of Onset  . Diabetes Other   . Diabetes Father   . Kidney disease Father   . Heart attack Father     Current Outpatient Medications (Endocrine & Metabolic):  .  levonorgestrel (MIRENA, 52 MG,) 20 MCG/24HR IUD,    Current Outpatient Medications (Respiratory):  .  promethazine (PHENERGAN) 25 MG tablet, Take 1 tablet (25 mg total) by mouth every 6 (six) hours as needed for nausea or vomiting.  Current Outpatient Medications (Analgesics):  .  meloxicam (MOBIC) 15 MG tablet, Take 1 tablet (15 mg total) by mouth daily as needed.  Current Outpatient Medications (Hematological):  Marland Kitchen  Ferrous Sulfate (IRON) 325 (65 Fe) MG TABS, Take by mouth.  Current Outpatient Medications (Other):  Marland Kitchen  Ascorbic Acid (VITAMIN C PO), Take by mouth daily. Marland Kitchen  BIOTIN PO, Take by mouth daily. Marland Kitchen  Diclofenac Sodium 2 % SOLN, Place 2 g onto the skin 2 (two) times daily. .  Flaxseed, Linseed, (FLAX SEED OIL) 1000 MG CAPS, Take by mouth. .  gabapentin (NEURONTIN) 100 MG capsule, TAKE 2 CAPSULES BY MOUTH EVERY NIGHT AT BEDTIME .  metaxalone (SKELAXIN) 800 MG tablet, Take 1 tablet (800 mg total) by mouth 3 (three) times daily as needed. .  Misc Natural Products (CALCIUM PYRUVATE PO), Take by mouth. .  Misc Natural Products (TART CHERRY ADVANCED PO), Take by mouth. .  Multiple Vitamin (MULTI-VITAMINS) TABS, Take by mouth. .  OIL OF OREGANO PO, Take by mouth daily. .  Vitamin D, Ergocalciferol, (DRISDOL) 1.25 MG (50000 UT) CAPS capsule, TAKE 1 CAPSULE BY MOUTH EVERY 7 DAYS.    Past medical history, social, surgical and family history all reviewed in electronic medical  record.  [name] unless stated regarding to the chief complaint.   Review of Systems:  [name] headache, visual changes, nausea, vomiting, diarrhea, constipation, dizziness, abdominal pain, skin rash, fevers, chills, night sweats, weight loss, swollen lymph nodes, body aches, joint swelling, muscle aches, chest pain, shortness of breath, mood changes.   Objective  Blood pressure 110/74, pulse 84, height 5\' 8"  (1.727 m), SpO2 98 %.    General: [name] mood and affect normal, dressed appropriately.  HEENT: Pupils equal, extraocular movements intact  Respiratory: Patient's speak in full sentences and does not appear short of breath  Cardiovascular: [name] lower extremity edema, non tender, [name] erythema  Skin: Warm dry intact with [name] signs of infection or rash on extremities or on axial skeleton.  Abdomen: Soft nontender  Neuro: Cranial nerves II through XII are intact, neurovascularly intact in all extremities with 2+ DTRs and 2+ pulses.  Lymph: [name] lymphadenopathy of posterior or anterior cervical chain or axillae bilaterally.  Gait normal with good balance and coordination.  MSK:  Non tender with full range of motion and good stability and symmetric strength and tone of shoulders, elbows, wrist, hip, knee and ankles bilaterally.  Neck: Inspection unremarkable. [name] palpable stepoffs. Negative Spurling's maneuver. Full neck range of motion Grip strength and sensation normal in bilateral hands Strength good C4 to T1 distribution [name] sensory change to C4 to T1 Negative Hoffman sign bilaterally Reflexes normal  Back Exam:  Inspection: Unremarkable  Motion: Flexion 40 deg, Extension 25 deg, Side Bending to 35 deg bilaterally,  Rotation to 45 deg bilaterally  SLR laying: Negative  XSLR laying: Negative  Palpable tenderness: Tender to palpation of paraspinal musculature lumbar spine right greater than left. FABER: Positive Faber. Sensory change:  Gross sensation intact to all lumbar and sacral dermatomes.  Reflexes: 2+ at both patellar tendons, 2+ at achilles tendons, Babinski's downgoing.  Strength at foot  Plantar-flexion: 5/5 Dorsi-flexion: 5/5 Eversion: 5/5 Inversion: 5/5  Leg strength  Quad: 5/5 Hamstring: 5/5 Hip flexor: 5/5 Hip abductors: 5/5  Gait unremarkable.  Osteopathic findings C2 flexed rotated and side bent right C6 flexed rotated and side bent left T3 extended rotated and side bent right inhaled third rib T9 extended rotated and side bent left L2 flexed rotated and side bent right Sacrum right on right    Impression and Recommendations:     This case required medical decision making of moderate complexity. The above documentation has been reviewed and is accurate and complete [name], DO       Note: This dictation was prepared with Dragon dictation along with smaller phrase technology. Any  transcriptional errors that result from this process are unintentional.

## 2018-08-17 NOTE — Patient Instructions (Signed)
[facility] orthotics Total Support Keep working on posture See me in 5-6 weeks

## 2018-08-17 NOTE — Assessment & Plan Note (Signed)
Decision today to treat with OMT was based on Physical Exam  After verbal consent patient was treated with HVLA, ME, FPR techniques in cervical, thoracic, rib,  lumbar and sacral areas  Patient tolerated the procedure well with improvement in symptoms  Patient given exercises, stretches and lifestyle modifications  See medications in patient instructions if given  Patient will follow up in 4-8 weeks 

## 2018-09-05 ENCOUNTER — Other Ambulatory Visit: Payer: Self-pay | Admitting: Family Medicine

## 2018-09-07 ENCOUNTER — Other Ambulatory Visit: Payer: 59

## 2018-09-07 ENCOUNTER — Telehealth: Payer: Self-pay

## 2018-09-07 DIAGNOSIS — Z20822 Contact with and (suspected) exposure to covid-19: Secondary | ICD-10-CM

## 2018-09-07 NOTE — Telephone Encounter (Signed)
Pt. Scheduled for today.

## 2018-09-07 NOTE — Telephone Encounter (Signed)
Izora Gala with [facility] Dept. Requests COVID 19 test for exposure.Order placed.

## 2018-09-19 ENCOUNTER — Encounter: Payer: Self-pay | Admitting: Family Medicine

## 2018-09-19 ENCOUNTER — Ambulatory Visit: Payer: 59 | Admitting: Family Medicine

## 2018-09-19 ENCOUNTER — Other Ambulatory Visit: Payer: Self-pay

## 2018-09-19 VITALS — BP 140/80 | HR 80 | Ht 68.0 in | Wt 179.0 lb

## 2018-09-19 DIAGNOSIS — M999 Biomechanical lesion, unspecified: Secondary | ICD-10-CM | POA: Diagnosis not present

## 2018-09-19 DIAGNOSIS — M533 Sacrococcygeal disorders, not elsewhere classified: Secondary | ICD-10-CM

## 2018-09-19 NOTE — Patient Instructions (Addendum)
Good to see you Do more isometric and stability exercises Follow up with me in 7-8 weeks

## 2018-09-19 NOTE — Progress Notes (Signed)
[name] [address] [place], [place] [address] Phone: [phone] Subjective:      CC: Neck and back pain follow-up  [id number]  Jenna [name] is a 53 y.o. female coming in with complaint of neck pain. Patient states she is slowly getting better. Patient was seen previously.  Has responded fairly well to osteopathic manipulation.  Patient is working at home and now has her stand-up desk that she thinks is going to be more beneficial.  Starting to workout on a little more regular basis.  Feels that that is helping with some of the muscle strength.  Patient went back to regularly scheduled gabapentin twice a day.  Thinks that this is helped as well     Past Medical History:  Diagnosis Date  . Anemia   . Wears contact lenses    Past Surgical History:  Procedure Laterality Date  . CERVICAL CERCLAGE    . COLONOSCOPY WITH PROPOFOL N/A 05/10/2018   Procedure: COLONOSCOPY WITH PROPOFOL;  Surgeon: Virgel Manifold, MD;  Location: [facility];  Service: Endoscopy;  Laterality: N/A;  . POLYPECTOMY  05/10/2018   Procedure: POLYPECTOMY;  Surgeon: Virgel Manifold, MD;  Location: [facility];  Service: Endoscopy;;   Social History   Socioeconomic History  . Marital status: Divorced    Spouse name: Not on file  . Number of children: Not on file  . Years of education: Not on file  . Highest education level: Not on file  Occupational History  . Not on file  Social Needs  . Financial resource strain: Not on file  . Food insecurity    Worry: Not on file    Inability: Not on file  . Transportation needs    Medical: Not on file    Non-medical: Not on file  Tobacco Use  . Smoking status: Never Smoker  . Smokeless tobacco: Never Used  Substance and Sexual Activity  . Alcohol use: Yes    Comment: socially - couple x/year  . Drug use: Never  . Sexual activity: Not on file  Lifestyle  . Physical activity    Days per week:  Not on file    Minutes per session: Not on file  . Stress: Not on file  Relationships  . Social Herbalist on phone: Not on file    Gets together: Not on file    Attends religious service: Not on file    Active member of club or organization: Not on file    Attends meetings of clubs or organizations: Not on file    Relationship status: Not on file  Other Topics Concern  . Not on file  Social History Narrative  . Not on file   Allergies  Allergen Reactions  . Sulfa Antibiotics Diarrhea and Nausea And Vomiting   Family History  Problem Relation Age of Onset  . Diabetes Other   . Diabetes Father   . Kidney disease Father   . Heart attack Father     Current Outpatient Medications (Endocrine & Metabolic):  .  levonorgestrel (MIRENA, 52 MG,) 20 MCG/24HR IUD,    Current Outpatient Medications (Respiratory):  .  promethazine (PHENERGAN) 25 MG tablet, Take 1 tablet (25 mg total) by mouth every 6 (six) hours as needed for nausea or vomiting.  Current Outpatient Medications (Analgesics):  .  meloxicam (MOBIC) 15 MG tablet, Take 1 tablet (15 mg total) by mouth daily as needed.  Current Outpatient Medications (  Hematological):  Marland Kitchen  Ferrous Sulfate (IRON) 325 (65 Fe) MG TABS, Take by mouth.  Current Outpatient Medications (Other):  Marland Kitchen  Ascorbic Acid (VITAMIN C PO), Take by mouth daily. Marland Kitchen  BIOTIN PO, Take by mouth daily. .  Diclofenac Sodium 2 % SOLN, Place 2 g onto the skin 2 (two) times daily. .  Flaxseed, Linseed, (FLAX SEED OIL) 1000 MG CAPS, Take by mouth. .  gabapentin (NEURONTIN) 100 MG capsule, TAKE 2 CAPSULES BY MOUTH EVERY NIGHT AT BEDTIME .  metaxalone (SKELAXIN) 800 MG tablet, Take 1 tablet (800 mg total) by mouth 3 (three) times daily as needed. .  Misc Natural Products (CALCIUM PYRUVATE PO), Take by mouth. .  Misc Natural Products (TART CHERRY ADVANCED PO), Take by mouth. .  Multiple Vitamin (MULTI-VITAMINS) TABS, Take by mouth. .  OIL OF OREGANO PO, Take by  mouth daily. .  Vitamin D, Ergocalciferol, (DRISDOL) 1.25 MG (50000 UT) CAPS capsule, TAKE 1 CAPSULE BY MOUTH EVERY 7 DAYS.    Past medical history, social, surgical and family history all reviewed in electronic medical record.  No pertanent information unless stated regarding to the chief complaint.   Review of Systems:  No headache, visual changes, nausea, vomiting, diarrhea, constipation, dizziness, abdominal pain, skin rash, fevers, chills, night sweats, weight loss, swollen lymph nodes, body aches, joint swelling,, chest pain, shortness of breath, mood changes.  Positive muscle aches  Objective  Blood pressure 140/80, pulse 80, height 5\' 8"  (1.727 m), weight 179 lb (81.2 kg), SpO2 98 %.   General: No apparent distress alert and oriented x3 mood and affect normal, dressed appropriately.  HEENT: Pupils equal, extraocular movements intact  Respiratory: Patient's speak in full sentences and does not appear short of breath  Cardiovascular: No lower extremity edema, non tender, no erythema  Skin: Warm dry intact with no signs of infection or rash on extremities or on axial skeleton.  Abdomen: Soft nontender  Neuro: Cranial nerves II through XII are intact, neurovascularly intact in all extremities with 2+ DTRs and 2+ pulses.  Lymph: No lymphadenopathy of posterior or anterior cervical chain or axillae bilaterally.  Gait antalgic and mature demeanor chronic confused but MSK:  Non tender with full range of motion and good stability and symmetric strength and tone of shoulders, elbows, wrist, hip, knee and ankles bilaterally.  Neck exam does have some loss of doses.  Patient does have some tenderness in trapezius bilaterally.  Back exam has some tenderness to palpation of the right sacroiliac joint.  Negative straight leg test.  Positive Faber test.  Osteopathic findings  C4 flexed rotated and side bent left C6 flexed rotated and side bent left T3 extended rotated and side bent right  inhaled third rib T9 extended rotated and side bent left L2 flexed rotated and side bent right Sacrum right on right    Impression and Recommendations:     This case required medical decision making of moderate complexity. The above documentation has been reviewed and is accurate and complete [name], DO       Note: This dictation was prepared with Dragon dictation along with smaller phrase technology. Any transcriptional errors that result from this process are unintentional.

## 2018-09-19 NOTE — Assessment & Plan Note (Signed)
Stable overall.  Discussed the strengthening of the core strength.  Discussed which activities of doing things to avoid.  Increase activity.  Discussed posture and ergonomics.  Follow-up again in 4 to 8 weeks

## 2018-09-19 NOTE — Assessment & Plan Note (Signed)
Decision today to treat with OMT was based on Physical Exam  After verbal consent patient was treated with HVLA, ME, FPR techniques in cervical, thoracic, rib,  lumbar and sacral areas  Patient tolerated the procedure well with improvement in symptoms  Patient given exercises, stretches and lifestyle modifications  See medications in patient instructions if given  Patient will follow up in 4-8 weeks 

## 2018-10-11 ENCOUNTER — Telehealth: Payer: Self-pay

## 2018-10-11 NOTE — Telephone Encounter (Signed)
Patient returned call and appointment for 9/14 has been rescheduled.

## 2018-10-12 ENCOUNTER — Encounter: Payer: Self-pay | Admitting: Family Medicine

## 2018-10-26 ENCOUNTER — Other Ambulatory Visit: Payer: Self-pay | Admitting: Family Medicine

## 2018-11-08 NOTE — Progress Notes (Signed)
[name] [address] [place], [place] [address] Phone: [phone] Subjective:   I Jenna [name] am serving as a Education administrator for Dr. Hulan Saas.   CC: Back pain follow-up  [id number]  Jenna [name] is a 53 y.o. female coming in with complaint of back pain. States the pain is on and off. Recently had 5 days off so she is more relaxed today.  Patient has had some neck pain as well as back pain previously.  Doing relatively well.  Recently feels that work does cause some stress.  Was on vacation recently.     Past Medical History:  Diagnosis Date  . Anemia   . Wears contact lenses    Past Surgical History:  Procedure Laterality Date  . CERVICAL CERCLAGE    . COLONOSCOPY WITH PROPOFOL N/A 05/10/2018   Procedure: COLONOSCOPY WITH PROPOFOL;  Surgeon: Virgel Manifold, MD;  Location: [facility];  Service: Endoscopy;  Laterality: N/A;  . POLYPECTOMY  05/10/2018   Procedure: POLYPECTOMY;  Surgeon: Virgel Manifold, MD;  Location: [facility];  Service: Endoscopy;;   Social History   Socioeconomic History  . Marital status: Divorced    Spouse name: Not on file  . Number of children: Not on file  . Years of education: Not on file  . Highest education level: Not on file  Occupational History  . Not on file  Social Needs  . Financial resource strain: Not on file  . Food insecurity    Worry: Not on file    Inability: Not on file  . Transportation needs    Medical: Not on file    Non-medical: Not on file  Tobacco Use  . Smoking status: Never Smoker  . Smokeless tobacco: Never Used  Substance and Sexual Activity  . Alcohol use: Yes    Comment: socially - couple x/year  . Drug use: Never  . Sexual activity: Not on file  Lifestyle  . Physical activity    Days per week: Not on file    Minutes per session: Not on file  . Stress: Not on file  Relationships  . Social Herbalist on phone: Not on file   Gets together: Not on file    Attends religious service: Not on file    Active member of club or organization: Not on file    Attends meetings of clubs or organizations: Not on file    Relationship status: Not on file  Other Topics Concern  . Not on file  Social History Narrative  . Not on file   Allergies  Allergen Reactions  . Sulfa Antibiotics Diarrhea and Nausea And Vomiting   Family History  Problem Relation Age of Onset  . Diabetes Other   . Diabetes Father   . Kidney disease Father   . Heart attack Father     Current Outpatient Medications (Endocrine & Metabolic):  .  levonorgestrel (MIRENA, 52 MG,) 20 MCG/24HR IUD,    Current Outpatient Medications (Respiratory):  .  promethazine (PHENERGAN) 25 MG tablet, Take 1 tablet (25 mg total) by mouth every 6 (six) hours as needed for nausea or vomiting.  Current Outpatient Medications (Analgesics):  .  meloxicam (MOBIC) 15 MG tablet, Take 1 tablet (15 mg total) by mouth daily as needed.  Current Outpatient Medications (Hematological):  Marland Kitchen  Ferrous Sulfate (IRON) 325 (65 Fe) MG TABS, Take by mouth.  Current Outpatient Medications (Other):  Marland Kitchen  Ascorbic Acid (  VITAMIN C PO), Take by mouth daily. Marland Kitchen  BIOTIN PO, Take by mouth daily. .  Diclofenac Sodium 2 % SOLN, Place 2 g onto the skin 2 (two) times daily. .  Flaxseed, Linseed, (FLAX SEED OIL) 1000 MG CAPS, Take by mouth. .  gabapentin (NEURONTIN) 100 MG capsule, TAKE 2 CAPSULES BY MOUTH EVERY NIGHT AT BEDTIME .  metaxalone (SKELAXIN) 800 MG tablet, Take 1 tablet (800 mg total) by mouth 3 (three) times daily as needed. .  Misc Natural Products (CALCIUM PYRUVATE PO), Take by mouth. .  Misc Natural Products (TART CHERRY ADVANCED PO), Take by mouth. .  Multiple Vitamin (MULTI-VITAMINS) TABS, Take by mouth. .  OIL OF OREGANO PO, Take by mouth daily. .  Vitamin D, Ergocalciferol, (DRISDOL) 1.25 MG (50000 UT) CAPS capsule, TAKE 1 CAPSULE BY MOUTH EVERY 7 DAYS.    Past medical  history, social, surgical and family history all reviewed in electronic medical record.  No pertanent information unless stated regarding to the chief complaint.   Review of Systems:  No headache, visual changes, nausea, vomiting, diarrhea, constipation, dizziness, abdominal pain, skin rash, fevers, chills, night sweats, weight loss, swollen lymph nodes, body aches, joint swelling, chest pain, shortness of breath, mood changes.  Positive muscle aches  Objective  Blood pressure 130/80, pulse 85, height 5\' 8"  (1.727 m), weight 182 lb (82.6 kg), SpO2 98 %.    General: No apparent distress alert and oriented x3 mood and affect normal, dressed appropriately.  HEENT: Pupils equal, extraocular movements intact  Respiratory: Patient's speak in full sentences and does not appear short of breath  Cardiovascular: No lower extremity edema, non tender, no erythema  Skin: Warm dry intact with no signs of infection or rash on extremities or on axial skeleton.  Abdomen: Soft nontender  Neuro: Cranial nerves II through XII are intact, neurovascularly intact in all extremities with 2+ DTRs and 2+ pulses.  Lymph: No lymphadenopathy of posterior or anterior cervical chain or axillae bilaterally.  Gait normal with good balance and coordination.  MSK:  Non tender with full range of motion and good stability and symmetric strength and tone of shoulders, elbows, wrist, hip, knee and ankles bilaterally.  Neck exam does have some loss of lordosis.  Patient does have some tenderness to palpation of paraspinal musculature and parascapular region.  Positive Corky Sox  Osteopathic findings  C6 flexed rotated and side bent left T3 extended rotated and side bent right inhaled third rib T5 extended rotated and side bent left L2 flexed rotated and side bent right Sacrum right on right    Impression and Recommendations:     This case required medical decision making of moderate complexity. The above documentation has  been reviewed and is accurate and complete [name], DO       Note: This dictation was prepared with Dragon dictation along with smaller phrase technology. Any transcriptional errors that result from this process are unintentional.

## 2018-11-09 ENCOUNTER — Other Ambulatory Visit: Payer: Self-pay

## 2018-11-09 ENCOUNTER — Ambulatory Visit: Payer: 59 | Admitting: Family Medicine

## 2018-11-09 ENCOUNTER — Encounter: Payer: Self-pay | Admitting: Family Medicine

## 2018-11-09 VITALS — BP 130/80 | HR 85 | Ht 68.0 in | Wt 182.0 lb

## 2018-11-09 DIAGNOSIS — M5030 Other cervical disc degeneration, unspecified cervical region: Secondary | ICD-10-CM

## 2018-11-09 DIAGNOSIS — M999 Biomechanical lesion, unspecified: Secondary | ICD-10-CM

## 2018-11-09 NOTE — Assessment & Plan Note (Signed)
Stable.  Did have some mild tightness.  Discussed icing regimen, home exercises and ergonomics during work.  Discussed meditation as a potential key.  Follow-up again in 4 to 8 weeks

## 2018-11-09 NOTE — Patient Instructions (Addendum)
Good to see you See [place] again in 2 months

## 2018-11-09 NOTE — Assessment & Plan Note (Signed)
Decision today to treat with OMT was based on Physical Exam  After verbal consent patient was treated with HVLA, ME, FPR techniques in cervical, thoracic, rib,  lumbar and sacral areas  Patient tolerated the procedure well with improvement in symptoms  Patient given exercises, stretches and lifestyle modifications  See medications in patient instructions if given  Patient will follow up in 4-8 weeks 

## 2018-11-11 ENCOUNTER — Encounter: Payer: Self-pay | Admitting: Family Medicine

## 2018-11-13 ENCOUNTER — Encounter: Payer: Self-pay | Admitting: Family Medicine

## 2018-11-14 ENCOUNTER — Ambulatory Visit: Payer: 59 | Admitting: Family Medicine

## 2018-11-16 ENCOUNTER — Ambulatory Visit: Payer: 59 | Admitting: Family Medicine

## 2018-12-23 DIAGNOSIS — Z1231 Encounter for screening mammogram for malignant neoplasm of breast: Secondary | ICD-10-CM | POA: Diagnosis not present

## 2018-12-23 DIAGNOSIS — Z006 Encounter for examination for normal comparison and control in clinical research program: Secondary | ICD-10-CM | POA: Diagnosis not present

## 2019-01-04 DIAGNOSIS — R922 Inconclusive mammogram: Secondary | ICD-10-CM | POA: Diagnosis not present

## 2019-01-04 DIAGNOSIS — R928 Other abnormal and inconclusive findings on diagnostic imaging of breast: Secondary | ICD-10-CM | POA: Diagnosis not present

## 2019-01-07 ENCOUNTER — Other Ambulatory Visit: Payer: Self-pay | Admitting: Family Medicine

## 2019-01-09 NOTE — Progress Notes (Signed)
[name] [address], [name] [address] Phone: [phone] Subjective:   [name], am serving as a scribe for Dr. Hulan Saas.    CC: Neck pain follow-up  [id number]  Jenna [name] is a 53 y.o. female coming in with complaint of neck and back pain. Last seen on 11/09/2018. Patient states that for the past couple of weeks her right shoulder feels weak. Has not injured shoulder or been lifting weights. Does not notice weakness with lifting objects but rather when at rest and she tries to move her arm.        Past Medical History:  Diagnosis Date  . Anemia   . Wears contact lenses    Past Surgical History:  Procedure Laterality Date  . CERVICAL CERCLAGE    . COLONOSCOPY WITH PROPOFOL N/A 05/10/2018   Procedure: COLONOSCOPY WITH PROPOFOL;  Surgeon: Virgel Manifold, MD;  Location: [facility];  Service: Endoscopy;  Laterality: N/A;  . POLYPECTOMY  05/10/2018   Procedure: POLYPECTOMY;  Surgeon: Virgel Manifold, MD;  Location: [facility];  Service: Endoscopy;;   Social History   Socioeconomic History  . Marital status: Divorced    Spouse name: Not on file  . Number of children: Not on file  . Years of education: Not on file  . Highest education level: Not on file  Occupational History  . Not on file  Social Needs  . Financial resource strain: Not on file  . Food insecurity    Worry: Not on file    Inability: Not on file  . Transportation needs    Medical: Not on file    Non-medical: Not on file  Tobacco Use  . Smoking status: Never Smoker  . Smokeless tobacco: Never Used  Substance and Sexual Activity  . Alcohol use: Yes    Comment: socially - couple x/year  . Drug use: Never  . Sexual activity: Not on file  Lifestyle  . Physical activity    Days per week: Not on file    Minutes per session: Not on file  . Stress: Not on file  Relationships  . Social Herbalist on phone:  Not on file    Gets together: Not on file    Attends religious service: Not on file    Active member of club or organization: Not on file    Attends meetings of clubs or organizations: Not on file    Relationship status: Not on file  Other Topics Concern  . Not on file  Social History Narrative  . Not on file   Allergies  Allergen Reactions  . Sulfa Antibiotics Diarrhea and Nausea And Vomiting   Family History  Problem Relation Age of Onset  . Diabetes Other   . Diabetes Father   . Kidney disease Father   . Heart attack Father     Current Outpatient Medications (Endocrine & Metabolic):  .  levonorgestrel (MIRENA, 52 MG,) 20 MCG/24HR IUD,    Current Outpatient Medications (Respiratory):  .  promethazine (PHENERGAN) 25 MG tablet, Take 1 tablet (25 mg total) by mouth every 6 (six) hours as needed for nausea or vomiting.  Current Outpatient Medications (Analgesics):  .  meloxicam (MOBIC) 15 MG tablet, TAKE 1 TABLET (15 MG TOTAL) BY MOUTH DAILY AS NEEDED.  Current Outpatient Medications (Hematological):  Marland Kitchen  Ferrous Sulfate (IRON) 325 (65 Fe) MG TABS, Take by mouth.  Current Outpatient Medications (Other):  .  Ascorbic Acid (VITAMIN C PO), Take by mouth daily. Marland Kitchen  BIOTIN PO, Take by mouth daily. .  Diclofenac Sodium 2 % SOLN, Place 2 g onto the skin 2 (two) times daily. .  Flaxseed, Linseed, (FLAX SEED OIL) 1000 MG CAPS, Take by mouth. .  gabapentin (NEURONTIN) 100 MG capsule, TAKE 2 CAPSULES BY MOUTH EVERY NIGHT AT BEDTIME .  metaxalone (SKELAXIN) 800 MG tablet, Take 1 tablet (800 mg total) by mouth 3 (three) times daily as needed. .  Misc Natural Products (CALCIUM PYRUVATE PO), Take by mouth. .  Misc Natural Products (TART CHERRY ADVANCED PO), Take by mouth. .  Multiple Vitamin (MULTI-VITAMINS) TABS, Take by mouth. .  OIL OF OREGANO PO, Take by mouth daily. .  Vitamin D, Ergocalciferol, (DRISDOL) 1.25 MG (50000 UT) CAPS capsule, TAKE 1 CAPSULE BY MOUTH EVERY 7 DAYS.     Past medical history, social, surgical and family history all reviewed in electronic medical record.  No pertanent information unless stated regarding to the chief complaint.   Review of Systems:  No headache, visual changes, nausea, vomiting, diarrhea, constipation, dizziness, abdominal pain, skin rash, fevers, chills, night sweats, weight loss, swollen lymph nodes, body aches, joint swelling,  chest pain, shortness of breath, mood changes.  Positive muscle aches  Objective  Blood pressure 122/84, pulse 79, height 5\' 8"  (1.727 m), SpO2 98 %.    General: No apparent distress alert and oriented x3 mood and affect normal, dressed appropriately.  HEENT: Pupils equal, extraocular movements intact  Respiratory: Patient's speak in full sentences and does not appear short of breath  Cardiovascular: No lower extremity edema, non tender, no erythema  Skin: Warm dry intact with no signs of infection or rash on extremities or on axial skeleton.  Abdomen: Soft nontender  Neuro: Cranial nerves II through XII are intact, neurovascularly intact in all extremities with 2+ DTRs and 2+ pulses.  Lymph: No lymphadenopathy of posterior or anterior cervical chain or axillae bilaterally.  Gait normal with good balance and coordination.  MSK:  Non tender with full range of motion and good stability and symmetric strength and tone of shoulders, elbows, wrist, hip, knee and ankles bilaterally.  Neck: Inspection mild loss of lordosis. No palpable stepoffs. Negative Spurling's maneuver. Mild limited range of motion in all planes of 5 to 10 degrees Grip strength and sensation normal in bilateral hands Strength good C4 to T1 distribution No sensory change to C4 to T1 Negative Hoffman sign bilaterally Reflexes normal  Osteopathic findings C4 flexed rotated and side bent right C7 flexed rotated and side bent left T3 extended rotated and side bent right inhaled third rib T6 extended rotated and side bent left L2  flexed rotated and side bent right Sacrum right on right    Impression and Recommendations:     This case required medical decision making of moderate complexity. The above documentation has been reviewed and is accurate and complete [name], DO       Note: This dictation was prepared with Dragon dictation along with smaller phrase technology. Any transcriptional errors that result from this process are unintentional.

## 2019-01-10 ENCOUNTER — Ambulatory Visit (INDEPENDENT_AMBULATORY_CARE_PROVIDER_SITE_OTHER): Payer: 59 | Admitting: Family Medicine

## 2019-01-10 ENCOUNTER — Encounter: Payer: Self-pay | Admitting: Family Medicine

## 2019-01-10 VITALS — BP 122/84 | HR 79 | Ht 68.0 in

## 2019-01-10 DIAGNOSIS — M999 Biomechanical lesion, unspecified: Secondary | ICD-10-CM | POA: Diagnosis not present

## 2019-01-10 DIAGNOSIS — M5030 Other cervical disc degeneration, unspecified cervical region: Secondary | ICD-10-CM | POA: Diagnosis not present

## 2019-01-10 NOTE — Patient Instructions (Signed)
Keep taking meloxicam regularly Process is going to be slow and sweet See me again in 4 weeks

## 2019-01-10 NOTE — Assessment & Plan Note (Signed)
Decision today to treat with OMT was based on Physical Exam  After verbal consent patient was treated with HVLA, ME, FPR techniques in cervical, thoracic, rib, lumbar and sacral areas  Patient tolerated the procedure well with improvement in symptoms  Patient given exercises, stretches and lifestyle modifications  See medications in patient instructions if given  Patient will follow up in 4-6 weeks 

## 2019-01-10 NOTE — Assessment & Plan Note (Signed)
Patient has had difficulty with bilateral shoulders with labral pathology but only seems to be more of a cervical radiculopathy.  We discussed with patient in great length about icing regimen, home exercise, gabapentin, meloxicam, responding well to manipulation.  Follow-up with me again in 4 to 6 weeks

## 2019-01-16 DIAGNOSIS — H5213 Myopia, bilateral: Secondary | ICD-10-CM | POA: Diagnosis not present

## 2019-01-16 DIAGNOSIS — H52222 Regular astigmatism, left eye: Secondary | ICD-10-CM | POA: Diagnosis not present

## 2019-01-16 DIAGNOSIS — H524 Presbyopia: Secondary | ICD-10-CM | POA: Diagnosis not present

## 2019-01-17 ENCOUNTER — Encounter: Payer: Self-pay | Admitting: Family Medicine

## 2019-01-20 ENCOUNTER — Ambulatory Visit
Admission: EM | Admit: 2019-01-20 | Discharge: 2019-01-20 | Disposition: A | Discharge status: Home / Self Care | Payer: 59 | Attending: Emergency Medicine | Admitting: Emergency Medicine

## 2019-01-20 ENCOUNTER — Other Ambulatory Visit: Payer: Self-pay

## 2019-01-20 DIAGNOSIS — J321 Chronic frontal sinusitis: Secondary | ICD-10-CM | POA: Diagnosis not present

## 2019-01-20 DIAGNOSIS — R438 Other disturbances of smell and taste: Secondary | ICD-10-CM | POA: Diagnosis not present

## 2019-01-20 DIAGNOSIS — M791 Myalgia, unspecified site: Secondary | ICD-10-CM | POA: Diagnosis not present

## 2019-01-20 DIAGNOSIS — J069 Acute upper respiratory infection, unspecified: Secondary | ICD-10-CM | POA: Diagnosis not present

## 2019-01-20 LAB — RAPID INFLUENZA A&B ANTIGENS
Influenza A (ARMC): NEGATIVE
Influenza B (ARMC): NEGATIVE

## 2019-01-20 LAB — SARS CORONAVIRUS 2 AG (30 MIN TAT): SARS Coronavirus 2 Ag: NEGATIVE

## 2019-01-20 MED ORDER — FLUTICASONE PROPIONATE 50 MCG/ACT NA SUSP: 2.0000 | Freq: Every day | NASAL | 0 refills | Status: DC

## 2019-01-20 MED ORDER — IBUPROFEN 600 MG PO TABS: 600.0000 mg | ORAL_TABLET | Freq: Four times a day (QID) | ORAL | 0 refills | Status: DC | PRN

## 2019-01-20 NOTE — Discharge Instructions (Addendum)
Your flu and rapid Covid test were both negative.  Your send out test will be back in 24 to 48 hours.  Take ibuprofen 600 mg combined with 1 g of Tylenol 3-4 times a day, saline nasal irrigation with a Milta Deiters med sinus rinse and distilled water as often as you want, Flonase, Mucinex D.  Self-isolation until Covid test is resulted.  Follow-up with PMD as needed

## 2019-01-20 NOTE — ED Provider Notes (Signed)
HPI  SUBJECTIVE:  Jenna [name] is a 53 y.o. female who presents with 3 days of body aches, frontal sinus headache, sinus pain and pressure, waking up sweaty at night, loss of taste.  She reports nausea 2 days ago, but this has resolved.  She reports some abdominal discomfort.  No fevers, nasal congestion, rhinorrhea, loss of sense of smell, sore throat, cough, shortness of breath, vomiting, diarrhea.  No facial swelling, upper dental pain.  No allergy symptoms.  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  She is currently working from home.  No antibiotics in the past month.  No Tylenol or ibuprofen in the past 4 to 6 hours.  She got a flu shot this year.  She has been taking Alka-Seltzer cold every 4-6 hours without improvement in her symptoms.  No aggravating factors.  Past medical history negative for diabetes, cancer, HIV, chronic kidney disease, hypertension, pulmonary disease, immunocompromise.  PMD: At [facility]    Past Medical History:  Diagnosis Date  . Anemia   . Wears contact lenses     Past Surgical History:  Procedure Laterality Date  . CERVICAL CERCLAGE    . COLONOSCOPY WITH PROPOFOL N/A 05/10/2018   Procedure: COLONOSCOPY WITH PROPOFOL;  Surgeon: Virgel Manifold, MD;  Location: [facility];  Service: Endoscopy;  Laterality: N/A;  . POLYPECTOMY  05/10/2018   Procedure: POLYPECTOMY;  Surgeon: Virgel Manifold, MD;  Location: [facility];  Service: Endoscopy;;    Family History  Problem Relation Age of Onset  . Diabetes Other   . Diabetes Father   . Kidney disease Father   . Heart attack Father     Social History   Tobacco Use  . Smoking status: Never Smoker  . Smokeless tobacco: Never Used  Substance Use Topics  . Alcohol use: Yes    Comment: socially - couple x/year  . Drug use: Never    No current facility-administered medications for this encounter.   Current Outpatient  Medications:  .  Ascorbic Acid (VITAMIN C PO), Take by mouth daily., Disp: , Rfl:  .  BIOTIN PO, Take by mouth daily., Disp: , Rfl:  .  Diclofenac Sodium 2 % SOLN, Place 2 g onto the skin 2 (two) times daily., Disp: 112 g, Rfl: 3 .  Ferrous Sulfate (IRON) 325 (65 Fe) MG TABS, Take by mouth., Disp: , Rfl:  .  Flaxseed, Linseed, (FLAX SEED OIL) 1000 MG CAPS, Take by mouth., Disp: , Rfl:  .  gabapentin (NEURONTIN) 100 MG capsule, TAKE 2 CAPSULES BY MOUTH EVERY NIGHT AT BEDTIME, Disp: 60 capsule, Rfl: 3 .  levonorgestrel (MIRENA, 52 MG,) 20 MCG/24HR IUD, , Disp: , Rfl:  .  Misc Natural Products (CALCIUM PYRUVATE PO), Take by mouth., Disp: , Rfl:  .  Misc Natural Products (TART CHERRY ADVANCED PO), Take by mouth., Disp: , Rfl:  .  Multiple Vitamin (MULTI-VITAMINS) TABS, Take by mouth., Disp: , Rfl:  .  OIL OF OREGANO PO, Take by mouth daily., Disp: , Rfl:  .  promethazine (PHENERGAN) 25 MG tablet, Take 1 tablet (25 mg total) by mouth every 6 (six) hours as needed for nausea or vomiting., Disp: 30 tablet, Rfl: 0 .  Vitamin D, Ergocalciferol, (DRISDOL) 1.25 MG (50000 UT) CAPS capsule, TAKE 1 CAPSULE BY MOUTH EVERY 7 DAYS., Disp: 12 capsule, Rfl: 0 .  fluticasone (FLONASE) 50 MCG/ACT nasal spray, Place 2 sprays into both  nostrils daily., Disp: 16 g, Rfl: 0 .  ibuprofen (ADVIL) 600 MG tablet, Take 1 tablet (600 mg total) by mouth every 6 (six) hours as needed., Disp: 30 tablet, Rfl: 0  Allergies  Allergen Reactions  . Sulfa Antibiotics Diarrhea and Nausea And Vomiting     ROS  As noted in HPI.   Physical Exam  Pulse 74   Temp 98.4 F (36.9 C) (Oral)   Resp 16   Ht '5\' 8"'$  (1.727 m)   Wt 81.6 kg   SpO2 99%   BMI 27.37 kg/m   Constitutional: Well developed, well nourished, no acute distress Eyes:  EOMI, conjunctiva normal bilaterally HENT: Normocephalic, atraumatic,mucus membranes moist.  Clear nasal congestion.  Normal turbinates.  No sinus tenderness.   Respiratory: Normal inspiratory  effort, lungs clear bilaterally, good air movement Cardiovascular: Normal rate regular rhythm no murmurs rubs or gallops  GI: nondistended skin: No rash, skin intact Musculoskeletal: no deformities Neurologic: Alert & oriented x 3, no focal neuro deficits Psychiatric: Speech and behavior appropriate   ED Course   Medications - No data to display  Orders Placed This Encounter  Procedures  . SARS Coronavirus 2 Ag (30 min TAT) - Nasal Swab (BD Veritor Kit)    Standing Status:   Standing    Number of Occurrences:   1    Order Specific Question:   Is this test for diagnosis or screening    Answer:   Diagnosis of ill patient    Order Specific Question:   Symptomatic for COVID-19 as defined by CDC    Answer:   Yes    Order Specific Question:   Date of Symptom Onset    Answer:   01/18/2019    Order Specific Question:   Hospitalized for COVID-19    Answer:   No    Order Specific Question:   Admitted to ICU for COVID-19    Answer:   No    Order Specific Question:   Previously tested for COVID-19    Answer:   Yes    Order Specific Question:   Resident in a congregate (group) care setting    Answer:   No    Order Specific Question:   Employed in healthcare setting    Answer:   No    Order Specific Question:   Pregnant    Answer:   No  . Rapid Influenza A&B Antigens ([facility] only)    Standing Status:   Standing    Number of Occurrences:   1  . Novel Coronavirus, NAA (Hosp order, Send-out to Ref Lab; TAT 18-24 hrs    Standing Status:   Standing    Number of Occurrences:   1    Order Specific Question:   Is this test for diagnosis or screening    Answer:   Diagnosis of ill patient    Order Specific Question:   Symptomatic for COVID-19 as defined by CDC    Answer:   Yes    Order Specific Question:   Date of Symptom Onset    Answer:   01/18/2019    Order Specific Question:   Hospitalized for COVID-19    Answer:   No    Order Specific Question:   Admitted to ICU for COVID-19    Answer:    No    Order Specific Question:   Previously tested for COVID-19    Answer:   Yes    Order Specific Question:   Resident in a  congregate (group) care setting    Answer:   No    Order Specific Question:   Employed in healthcare setting    Answer:   No    Order Specific Question:   Pregnant    Answer:   No  . Droplet precaution    Standing Status:   Standing    Number of Occurrences:   1    Results for orders placed or performed during the hospital encounter of 01/20/19 (from the past 24 hour(s))  SARS Coronavirus 2 Ag (30 min TAT) - Nasal Swab (BD Veritor Kit)     Status: None   Collection Time: 01/20/19  3:58 PM   Specimen: Nasal Swab (BD Veritor Kit)  Result Value Ref Range   SARS Coronavirus 2 Ag NEGATIVE NEGATIVE   No results found.  ED Clinical Impression  1. Upper respiratory tract infection, unspecified type      ED Assessment/Plan  Patient also requesting flu testing.  Rapid Covid negative.  Covid PCR sent.  Home with ibuprofen 600 mg combined with 1 g of Tylenol 3-4 times a day, saline nasal irrigation, Flonase, Mucinex D.  Self-isolation until Covid test is resulted.  Follow-up with PMD as needed  Rapid flu negative.  Plan as above.  Discussed labs, MDM, treatment plan, and plan for follow-up with patient. patient agrees with plan.   Meds ordered this encounter  Medications  . ibuprofen (ADVIL) 600 MG tablet    Sig: Take 1 tablet (600 mg total) by mouth every 6 (six) hours as needed.    Dispense:  30 tablet    Refill:  0  . fluticasone (FLONASE) 50 MCG/ACT nasal spray    Sig: Place 2 sprays into both nostrils daily.    Dispense:  16 g    Refill:  0    *This clinic note was created using Lobbyist. Therefore, there may be occasional mistakes despite careful proofreading.   ?   [name], MD 01/21/19 1450

## 2019-01-20 NOTE — ED Triage Notes (Signed)
Patient complains of body aches, headaches, sinus congestion and pressure x 2 days.

## 2019-01-21 LAB — NOVEL CORONAVIRUS, NAA (HOSP ORDER, SEND-OUT TO REF LAB; TAT 18-24 HRS): SARS-CoV-2, NAA: NOT DETECTED

## 2019-02-01 ENCOUNTER — Other Ambulatory Visit: Payer: Self-pay | Admitting: Family Medicine

## 2019-02-07 ENCOUNTER — Encounter: Payer: Self-pay | Admitting: Family Medicine

## 2019-02-09 ENCOUNTER — Ambulatory Visit: Payer: 59 | Admitting: Family Medicine

## 2019-02-15 ENCOUNTER — Encounter: Payer: Self-pay | Admitting: Family Medicine

## 2019-02-16 ENCOUNTER — Ambulatory Visit: Payer: 59 | Admitting: Family Medicine

## 2019-02-26 ENCOUNTER — Encounter: Payer: Self-pay | Admitting: Family Medicine

## 2019-03-01 ENCOUNTER — Encounter: Payer: Self-pay | Admitting: Family Medicine

## 2019-03-01 ENCOUNTER — Ambulatory Visit (INDEPENDENT_AMBULATORY_CARE_PROVIDER_SITE_OTHER): Payer: 59 | Admitting: Family Medicine

## 2019-03-01 ENCOUNTER — Other Ambulatory Visit: Payer: Self-pay

## 2019-03-01 VITALS — BP 140/90 | HR 88 | Ht 68.0 in | Wt 178.0 lb

## 2019-03-01 DIAGNOSIS — M5030 Other cervical disc degeneration, unspecified cervical region: Secondary | ICD-10-CM

## 2019-03-01 DIAGNOSIS — M999 Biomechanical lesion, unspecified: Secondary | ICD-10-CM

## 2019-03-01 MED ORDER — PREDNISONE 50 MG PO TABS: 50.0000 mg | ORAL_TABLET | Freq: Every day | ORAL | 0 refills | Status: DC

## 2019-03-01 NOTE — Assessment & Plan Note (Signed)
Known degenerative changes.  Discussed icing regimen and home exercise, discussed continuing the gabapentin possibly.  Discussed baby aspirin after having Covid.  Discussed if any shortness of breath to seek medical attention.  Follow-up with me again in 4 to 8 weeks

## 2019-03-01 NOTE — Progress Notes (Signed)
[facility] [address] [place] Phone: [phone] Subjective:   I Jenna [name] am serving as a Education administrator for Dr. Hulan Saas.  This visit occurred during the SARS-CoV-2 public health emergency.  Safety protocols were in place, including screening questions prior to the visit, [name], and extensive cleaning of exam room while observing appropriate contact time as indicated for disinfecting solutions.   I'm seeing this patient by the request  of:    CC: Neck and back pain follow-up  [id number]  Jenna [name] is a 53 y.o. female coming in with complaint of back pain. Patient states she is not doing too bad. OMT. States she took some meloxicam last night.  Patient did have Covid, and did have some mild symptoms initially, patient's is now 4 weeks out since the infectious.  Is still having some fatigue.  Denies of shortness of breath or any chest pain.    Past Medical History:  Diagnosis Date  . Anemia   . Wears contact lenses    Past Surgical History:  Procedure Laterality Date  . CERVICAL CERCLAGE    . COLONOSCOPY WITH PROPOFOL N/A 05/10/2018   Procedure: COLONOSCOPY WITH PROPOFOL;  Surgeon: Virgel Manifold, MD;  Location: [facility];  Service: Endoscopy;  Laterality: N/A;  . POLYPECTOMY  05/10/2018   Procedure: POLYPECTOMY;  Surgeon: Virgel Manifold, MD;  Location: [facility];  Service: Endoscopy;;   Social History   Socioeconomic History  . Marital status: Divorced    Spouse name: Not on file  . Number of children: Not on file  . Years of education: Not on file  . Highest education level: Not on file  Occupational History  . Not on file  Tobacco Use  . Smoking status: Never Smoker  . Smokeless tobacco: Never Used  Substance and Sexual Activity  . Alcohol use: Yes    Comment: socially - couple x/year  . Drug use: Never  . Sexual activity: Not on file  Other Topics  Concern  . Not on file  Social History Narrative  . Not on file   Social Determinants of Health   Financial Resource Strain:   . Difficulty of Paying Living Expenses: Not on file  Food Insecurity:   . Worried About Charity fundraiser in the Last Year: Not on file  . Ran Out of Food in the Last Year: Not on file  Transportation Needs:   . Lack of Transportation (Medical): Not on file  . Lack of Transportation (Non-Medical): Not on file  Physical Activity:   . Days of Exercise per Week: Not on file  . Minutes of Exercise per Session: Not on file  Stress:   . Feeling of Stress : Not on file  Social Connections:   . Frequency of Communication with Friends and Family: Not on file  . Frequency of Social Gatherings with Friends and Family: Not on file  . Attends Religious Services: Not on file  . Active Member of Clubs or Organizations: Not on file  . Attends Archivist Meetings: Not on file  . Marital Status: Not on file   Allergies  Allergen Reactions  . Sulfa Antibiotics Diarrhea and Nausea And Vomiting   Family History  Problem Relation Age of Onset  . Diabetes Other   . Diabetes Father   . Kidney disease Father   . Heart attack Father     Current Outpatient Medications (Endocrine &  Metabolic):  .  levonorgestrel (MIRENA, 52 MG,) 20 MCG/24HR IUD,  .  predniSONE (DELTASONE) 50 MG tablet, Take 1 tablet (50 mg total) by mouth daily.   Current Outpatient Medications (Respiratory):  .  fluticasone (FLONASE) 50 MCG/ACT nasal spray, Place 2 sprays into both nostrils daily. .  promethazine (PHENERGAN) 25 MG tablet, Take 1 tablet (25 mg total) by mouth every 6 (six) hours as needed for nausea or vomiting.  Current Outpatient Medications (Analgesics):  .  ibuprofen (ADVIL) 600 MG tablet, Take 1 tablet (600 mg total) by mouth every 6 (six) hours as needed.  Current Outpatient Medications (Hematological):  Marland Kitchen  Ferrous Sulfate (IRON) 325 (65 Fe) MG TABS, Take by  mouth.  Current Outpatient Medications (Other):  Marland Kitchen  Ascorbic Acid (VITAMIN C PO), Take by mouth daily. Marland Kitchen  BIOTIN PO, Take by mouth daily. .  Diclofenac Sodium 2 % SOLN, Place 2 g onto the skin 2 (two) times daily. .  Flaxseed, Linseed, (FLAX SEED OIL) 1000 MG CAPS, Take by mouth. .  gabapentin (NEURONTIN) 100 MG capsule, TAKE 2 CAPSULES BY MOUTH EVERY NIGHT AT BEDTIME .  Misc Natural Products (CALCIUM PYRUVATE PO), Take by mouth. .  Misc Natural Products (TART CHERRY ADVANCED PO), Take by mouth. .  Multiple Vitamin (MULTI-VITAMINS) TABS, Take by mouth. .  OIL OF OREGANO PO, Take by mouth daily. .  Vitamin D, Ergocalciferol, (DRISDOL) 1.25 MG (50000 UT) CAPS capsule, TAKE 1 CAPSULE BY MOUTH EVERY 7 DAYS.    Past medical history, social, surgical and family history all reviewed in electronic medical record.  No pertanent information unless stated regarding to the chief complaint.   Review of Systems:  No  visual changes, nausea, vomiting, diarrhea, constipation, dizziness, abdominal pain, skin rash, fevers, chills, night sweats, weight loss, swollen lymph nodes, body aches, joint swelling, muscle aches, chest pain, shortness of breath, mood changes.  Positive headache  Objective  Blood pressure 140/90, pulse 88, height 5\' 8"  (1.727 m), weight 178 lb (80.7 kg), SpO2 97 %.    General: No apparent distress alert and oriented x3 mood and affect normal, dressed appropriately.  HEENT: Pupils equal, extraocular movements intact  Respiratory: Patient's speak in full sentences and does not appear short of breath  Cardiovascular: No lower extremity edema, non tender, no erythema  Skin: Warm dry intact with no signs of infection or rash on extremities or on axial skeleton.  Abdomen: Soft nontender  Neuro: Cranial nerves II through XII are intact, neurovascularly intact in all extremities with 2+ DTRs and 2+ pulses.  Lymph: No lymphadenopathy of posterior or anterior cervical chain or axillae  bilaterally.  Gait normal with good balance and coordination.  MSK:  Non tender with full range of motion and good stability and symmetric strength and tone of shoulders, elbows, wrist, hip, knee and ankles bilaterally.   Neck exam does have some loss of lordosis.  Patient does have some tenderness to palpation in the parascapular region.  Patient does have negative Spurling's.  5 out of 5 strength of the upper extremity.  Mild limited range of motion of the neck lacking last 5 degrees of extension in the last 10 degrees of flexion.  Osteopathic findings C2 flexed rotated and side bent right C6 flexed rotated and side bent left T3 extended rotated and side bent right inhaled third rib T11 extended rotated and side bent left L2 flexed rotated and side bent right Sacrum right on right     Impression and Recommendations:  This case required medical decision making of moderate complexity. The above documentation has been reviewed and is accurate and complete [name], DO       Note: This dictation was prepared with Dragon dictation along with smaller phrase technology. Any transcriptional errors that result from this process are unintentional.

## 2019-03-01 NOTE — Patient Instructions (Signed)
Baby Asprin daily for the next month Get back on your routine See me again in 4 weeks

## 2019-03-01 NOTE — Assessment & Plan Note (Signed)
Decision today to treat with OMT was based on Physical Exam  After verbal consent patient was treated with HVLA, ME, FPR techniques in cervical, thoracic, rib,  lumbar and sacral areas  Patient tolerated the procedure well with improvement in symptoms  Patient given exercises, stretches and lifestyle modifications  See medications in patient instructions if given  Patient will follow up in 4-8 weeks 

## 2019-03-07 ENCOUNTER — Encounter: Payer: Self-pay | Admitting: Family Medicine

## 2019-03-08 ENCOUNTER — Encounter: Payer: Self-pay | Admitting: Family Medicine

## 2019-03-14 ENCOUNTER — Encounter: Payer: Self-pay | Admitting: Family Medicine

## 2019-03-15 ENCOUNTER — Other Ambulatory Visit: Payer: Self-pay

## 2019-03-15 DIAGNOSIS — M2550 Pain in unspecified joint: Secondary | ICD-10-CM

## 2019-03-15 MED ORDER — PREDNISONE 50 MG PO TABS: ORAL_TABLET | ORAL | 0 refills | Status: DC

## 2019-03-16 ENCOUNTER — Other Ambulatory Visit (INDEPENDENT_AMBULATORY_CARE_PROVIDER_SITE_OTHER): Payer: 59

## 2019-03-16 DIAGNOSIS — M2550 Pain in unspecified joint: Secondary | ICD-10-CM | POA: Diagnosis not present

## 2019-03-16 LAB — CBC WITH DIFFERENTIAL/PLATELET
Basophils Absolute: 0.1 10*3/uL (ref 0.0–0.1)
Basophils Relative: 0.8 % (ref 0.0–3.0)
Eosinophils Absolute: 0.3 10*3/uL (ref 0.0–0.7)
Eosinophils Relative: 3.1 % (ref 0.0–5.0)
HCT: 41 % (ref 36.0–46.0)
Hemoglobin: 13.3 g/dL (ref 12.0–15.0)
Lymphocytes Relative: 34.8 % (ref 12.0–46.0)
Lymphs Abs: 2.9 10*3/uL (ref 0.7–4.0)
MCHC: 32.4 g/dL (ref 30.0–36.0)
MCV: 89.1 fl (ref 78.0–100.0)
Monocytes Absolute: 0.6 10*3/uL (ref 0.1–1.0)
Monocytes Relative: 7.1 % (ref 3.0–12.0)
Neutro Abs: 4.6 10*3/uL (ref 1.4–7.7)
Neutrophils Relative %: 54.2 % (ref 43.0–77.0)
Platelets: 215 10*3/uL (ref 150.0–400.0)
RBC: 4.61 Mil/uL (ref 3.87–5.11)
RDW: 13.7 % (ref 11.5–15.5)
WBC: 8.4 10*3/uL (ref 4.0–10.5)

## 2019-03-16 LAB — C-REACTIVE PROTEIN: CRP: <1 mg/dL (ref 0.5–20.0)

## 2019-03-16 LAB — SEDIMENTATION RATE: Sed Rate: 10 mm/hr (ref 0–30)

## 2019-03-17 LAB — D-DIMER, QUANTITATIVE: D-Dimer, Quant: <0.19 mcg/mL FEU (ref <0.50)

## 2019-03-27 DIAGNOSIS — Z0000 Encounter for general adult medical examination without abnormal findings: Secondary | ICD-10-CM | POA: Diagnosis not present

## 2019-03-27 DIAGNOSIS — Z113 Encounter for screening for infections with a predominantly sexual mode of transmission: Secondary | ICD-10-CM | POA: Diagnosis not present

## 2019-03-27 DIAGNOSIS — R0989 Other specified symptoms and signs involving the circulatory and respiratory systems: Secondary | ICD-10-CM | POA: Diagnosis not present

## 2019-03-27 DIAGNOSIS — G8929 Other chronic pain: Secondary | ICD-10-CM | POA: Diagnosis not present

## 2019-03-27 DIAGNOSIS — Z13220 Encounter for screening for lipoid disorders: Secondary | ICD-10-CM | POA: Diagnosis not present

## 2019-03-27 DIAGNOSIS — Z1239 Encounter for other screening for malignant neoplasm of breast: Secondary | ICD-10-CM | POA: Diagnosis not present

## 2019-03-27 DIAGNOSIS — Z131 Encounter for screening for diabetes mellitus: Secondary | ICD-10-CM | POA: Diagnosis not present

## 2019-03-27 DIAGNOSIS — E079 Disorder of thyroid, unspecified: Secondary | ICD-10-CM | POA: Diagnosis not present

## 2019-03-27 DIAGNOSIS — M25512 Pain in left shoulder: Secondary | ICD-10-CM | POA: Diagnosis not present

## 2019-03-27 DIAGNOSIS — N912 Amenorrhea, unspecified: Secondary | ICD-10-CM | POA: Diagnosis not present

## 2019-03-28 DIAGNOSIS — Z113 Encounter for screening for infections with a predominantly sexual mode of transmission: Secondary | ICD-10-CM | POA: Diagnosis not present

## 2019-03-28 DIAGNOSIS — N912 Amenorrhea, unspecified: Secondary | ICD-10-CM | POA: Diagnosis not present

## 2019-03-28 DIAGNOSIS — E079 Disorder of thyroid, unspecified: Secondary | ICD-10-CM | POA: Diagnosis not present

## 2019-03-28 DIAGNOSIS — Z0000 Encounter for general adult medical examination without abnormal findings: Secondary | ICD-10-CM | POA: Diagnosis not present

## 2019-03-28 DIAGNOSIS — Z131 Encounter for screening for diabetes mellitus: Secondary | ICD-10-CM | POA: Diagnosis not present

## 2019-03-28 DIAGNOSIS — Z13220 Encounter for screening for lipoid disorders: Secondary | ICD-10-CM | POA: Diagnosis not present

## 2019-03-30 DIAGNOSIS — R0989 Other specified symptoms and signs involving the circulatory and respiratory systems: Secondary | ICD-10-CM | POA: Insufficient documentation

## 2019-04-03 ENCOUNTER — Ambulatory Visit: Payer: 59 | Admitting: Family Medicine

## 2019-04-04 ENCOUNTER — Encounter: Payer: Self-pay | Admitting: Family Medicine

## 2019-04-04 ENCOUNTER — Ambulatory Visit: Payer: 59 | Admitting: Family Medicine

## 2019-04-04 ENCOUNTER — Other Ambulatory Visit: Payer: Self-pay

## 2019-04-04 VITALS — BP 120/82 | HR 84 | Ht 68.0 in | Wt 178.0 lb

## 2019-04-04 DIAGNOSIS — M999 Biomechanical lesion, unspecified: Secondary | ICD-10-CM

## 2019-04-04 DIAGNOSIS — M5030 Other cervical disc degeneration, unspecified cervical region: Secondary | ICD-10-CM

## 2019-04-04 NOTE — Assessment & Plan Note (Signed)
Decision today to treat with OMT was based on Physical Exam  After verbal consent patient was treated with HVLA, ME, FPR techniques in cervical, thoracic, rib,  lumbar and sacral areas  Patient tolerated the procedure well with improvement in symptoms  Patient given exercises, stretches and lifestyle modifications  See medications in patient instructions if given  Patient will follow up in 4-8 weeks 

## 2019-04-04 NOTE — Progress Notes (Signed)
[place] [address] [place] Phone: [phone] Subjective:   I Jenna [name] am serving as a Education administrator for Dr. Hulan Saas.  This visit occurred during the SARS-CoV-2 public health emergency.  Safety protocols were in place, including screening questions prior to the visit, [name], [name], [name], PA-C  CC: Low back pain  [id number]  Jenna [name] is a 54 y.o. female coming in with complaint of back pain. Patient states she hasn't been in too much pain. Prednisone helped her back.  Patient has been doing relatively well overall.  Has been sitting little bit more at work and thinks that is contributing to some mild discomfort.  Patient recently though did have Covid and is doing better.  Patient was given prednisone to 10 rounds significant but that has decreased inflammation significantly and has not needed any anti-inflammatories for quite some time.  Denies shortness of breath, chest pain.      Past Medical History:  Diagnosis Date  . Anemia   . Wears contact lenses    Past Surgical History:  Procedure Laterality Date  . CERVICAL CERCLAGE    . COLONOSCOPY WITH PROPOFOL N/A 05/10/2018   Procedure: COLONOSCOPY WITH PROPOFOL;  Surgeon: Virgel Manifold, MD;  Location: [facility];  Service: Endoscopy;  Laterality: N/A;  . POLYPECTOMY  05/10/2018   Procedure: POLYPECTOMY;  Surgeon: Virgel Manifold, MD;  Location: [facility];  Service: Endoscopy;;   Social History   Socioeconomic History  . Marital status: Divorced    Spouse name: Not on file  . Number of children: Not on file  . Years of education: Not on file  . Highest education level: Not on file  Occupational History  . Not on file  Tobacco Use  .  Smoking status: Never Smoker  . Smokeless tobacco: Never Used  Substance and Sexual Activity  . Alcohol use: Yes    Comment: socially - couple x/year  . Drug use: Never  . Sexual activity: Not on file  Other Topics Concern  . Not on file  Social History Narrative  . Not on file   Social Determinants of Health   Financial Resource Strain:   . Difficulty of Paying Living Expenses: Not on file  Food Insecurity:   . Worried About Charity fundraiser in the Last Year: Not on file  . Ran Out of Food in the Last Year: Not on file  Transportation Needs:   . Lack of Transportation (Medical): Not on file  . Lack of Transportation (Non-Medical): Not on file  Physical Activity:   . Days of Exercise per Week: Not on file  . Minutes of Exercise per Session: Not on file  Stress:   . Feeling of Stress : Not on file  Social Connections:   . Frequency of Communication with Friends and Family: Not on file  . Frequency of Social Gatherings with Friends and Family: Not on file  . Attends Religious Services: Not on file  . Active Member of Clubs or Organizations: Not on file  . Attends Archivist Meetings: Not on file  . Marital Status: Not on file   Allergies  Allergen Reactions  . Sulfa Antibiotics Diarrhea and Nausea And Vomiting   Family History  Problem Relation  Age of Onset  . Diabetes Other   . Diabetes Father   . Kidney disease Father   . Heart attack Father     Current Outpatient Medications (Endocrine & Metabolic):  .  levonorgestrel (MIRENA, 52 MG,) 20 MCG/24HR IUD,  .  predniSONE (DELTASONE) 50 MG tablet, Take 1 tablet (50 mg total) by mouth daily. .  predniSONE (DELTASONE) 50 MG tablet, Take one tablet daily for the next 5 days.   Current Outpatient Medications (Respiratory):  .  fluticasone (FLONASE) 50 MCG/ACT nasal spray, Place 2 sprays into both nostrils daily. .  promethazine (PHENERGAN) 25 MG tablet, Take 1 tablet (25 mg total) by mouth every 6 (six)  hours as needed for nausea or vomiting.  Current Outpatient Medications (Analgesics):  .  ibuprofen (ADVIL) 600 MG tablet, Take 1 tablet (600 mg total) by mouth every 6 (six) hours as needed.  Current Outpatient Medications (Hematological):  Marland Kitchen  Ferrous Sulfate (IRON) 325 (65 Fe) MG TABS, Take by mouth.  Current Outpatient Medications (Other):  Marland Kitchen  Ascorbic Acid (VITAMIN C PO), Take by mouth daily. Marland Kitchen  BIOTIN PO, Take by mouth daily. .  Diclofenac Sodium 2 % SOLN, Place 2 g onto the skin 2 (two) times daily. .  Flaxseed, Linseed, (FLAX SEED OIL) 1000 MG CAPS, Take by mouth. .  gabapentin (NEURONTIN) 100 MG capsule, TAKE 2 CAPSULES BY MOUTH EVERY NIGHT AT BEDTIME .  Misc Natural Products (CALCIUM PYRUVATE PO), Take by mouth. .  Misc Natural Products (TART CHERRY ADVANCED PO), Take by mouth. .  Multiple Vitamin (MULTI-VITAMINS) TABS, Take by mouth. .  OIL OF OREGANO PO, Take by mouth daily. .  Vitamin D, Ergocalciferol, (DRISDOL) 1.25 MG (50000 UT) CAPS capsule, TAKE 1 CAPSULE BY MOUTH EVERY 7 DAYS.   Reviewed prior external information including notes and imaging from  primary care provider As well as notes that were available from care everywhere and other healthcare systems.  Past medical history, social, surgical and family history all reviewed in electronic medical record.  No pertanent information unless stated regarding to the chief complaint.   Review of Systems:  No , visual changes, nausea, vomiting, diarrhea, constipation, dizziness, abdominal pain, skin rash, fevers, chills, night sweats, weight loss, swollen lymph nodes, body aches, joint swelling, chest pain, shortness of breath, mood changes. POSITIVE muscle aches, headache  Objective  Blood pressure 120/82, pulse 84, height 5\' 8"  (1.727 m), weight 178 lb (80.7 kg), SpO2 98 %.   General: No apparent distress alert and oriented x3 mood and affect normal, dressed appropriately.  HEENT: Pupils equal, extraocular movements  intact  Respiratory: Patient's speak in full sentences and does not appear short of breath  Cardiovascular: No lower extremity edema, non tender, no erythema  Skin: Warm dry intact with no signs of infection or rash on extremities or on axial skeleton.  Abdomen: Soft nontender  Neuro: Cranial nerves II through XII are intact, neurovascularly intact in all extremities with 2+ DTRs and 2+ pulses.  Lymph: No lymphadenopathy of posterior or anterior cervical chain or axillae bilaterally.  Gait normal with good balance and coordination.  MSK:  Non tender with full range of motion and good stability and symmetric strength and tone of shoulders, elbows, wrist, hip, knee and ankles bilaterally.  Neck exam does have some mild loss of lordosis.  Some tightness in the parascapular region right greater than left.  Osteopathic findings \ C6 flexed rotated and side bent left T5 extended rotated and side bent right  inhaled rib T7 extended rotated and side bent left L2 flexed rotated and side bent right Sacrum right on right    Impression and Recommendations:     This case required medical decision making of moderate complexity. The above documentation has been reviewed and is accurate and complete [name], DO       Note: This dictation was prepared with Dragon dictation along with smaller phrase technology. Any transcriptional errors that result from this process are unintentional.

## 2019-04-04 NOTE — Patient Instructions (Addendum)
Good to see you Much better Get the vaccine See me again in 6-7 weeks

## 2019-04-04 NOTE — Assessment & Plan Note (Signed)
Cervical spine does have some mild loss of lordosis.  We discussed icing regimen and home exercise, we discussed which activities to do which wants to avoid.  Patient is feeling better after the prednisone that she was given for her post Covid inflammation.  Patient has changed the timing of her vitamin supplementations.  Discussed with her at great length including this and reviewed patient's previous imaging.Total time with patient was 41 minutes.

## 2019-04-30 ENCOUNTER — Ambulatory Visit: Payer: 59 | Attending: Internal Medicine

## 2019-04-30 DIAGNOSIS — Z23 Encounter for immunization: Secondary | ICD-10-CM | POA: Insufficient documentation

## 2019-04-30 NOTE — Progress Notes (Signed)
   Covid-19 Vaccination Clinic  Name:  CLEDA HILTUNEN    MRN: [id number] DOB: [birth year]  04/30/2019  Ms. Sevey was observed post Covid-19 immunization for 15 minutes without incidence. She was provided with Vaccine Information Sheet and instruction to access the V-Safe system.   Ms. Nesmith was instructed to call 911 with any severe reactions post vaccine: Marland Kitchen Difficulty breathing  . Swelling of your face and throat  . A fast heartbeat  . A bad rash all over your body  . Dizziness and weakness    Immunizations Administered    Name Date Dose VIS Date Route   Pfizer COVID-19 Vaccine 04/30/2019  8:50 AM 0.3 mL 02/10/2019 Intramuscular   Manufacturer: [place]   Lot: [id number]   [place]: [id number]

## 2019-05-05 DIAGNOSIS — N912 Amenorrhea, unspecified: Secondary | ICD-10-CM | POA: Diagnosis not present

## 2019-05-16 ENCOUNTER — Encounter: Payer: Self-pay | Admitting: Family Medicine

## 2019-05-17 ENCOUNTER — Other Ambulatory Visit: Payer: 59

## 2019-05-17 ENCOUNTER — Other Ambulatory Visit: Payer: Self-pay | Admitting: *Deleted

## 2019-05-17 ENCOUNTER — Other Ambulatory Visit: Payer: Self-pay

## 2019-05-17 ENCOUNTER — Other Ambulatory Visit (INDEPENDENT_AMBULATORY_CARE_PROVIDER_SITE_OTHER): Payer: 59

## 2019-05-17 ENCOUNTER — Ambulatory Visit: Payer: 59 | Admitting: Family Medicine

## 2019-05-17 ENCOUNTER — Encounter: Payer: Self-pay | Admitting: Family Medicine

## 2019-05-17 VITALS — BP 122/72 | HR 87 | Ht 68.0 in | Wt 177.0 lb

## 2019-05-17 DIAGNOSIS — M999 Biomechanical lesion, unspecified: Secondary | ICD-10-CM

## 2019-05-17 DIAGNOSIS — M2550 Pain in unspecified joint: Secondary | ICD-10-CM

## 2019-05-17 DIAGNOSIS — M5030 Other cervical disc degeneration, unspecified cervical region: Secondary | ICD-10-CM | POA: Diagnosis not present

## 2019-05-17 LAB — IBC PANEL
Iron: 72 ug/dL (ref 42–145)
Saturation Ratios: 24 % (ref 20.0–50.0)
Transferrin: 214 mg/dL (ref 212.0–360.0)

## 2019-05-17 LAB — FERRITIN: Ferritin: 88.4 ng/mL (ref 10.0–291.0)

## 2019-05-17 LAB — VITAMIN D 25 HYDROXY (VIT D DEFICIENCY, FRACTURES): VITD: 54.3 ng/mL (ref 30.00–100.00)

## 2019-05-17 LAB — TSH: TSH: 1.38 u[IU]/mL (ref 0.35–4.50)

## 2019-05-17 NOTE — Patient Instructions (Addendum)
Labs today ?See me in 4-5 weeks ?

## 2019-05-17 NOTE — Assessment & Plan Note (Signed)
.    Discussed medication management with vascular as appropriate with the gabapentin.  Responds well to osteopathic manipulation.  Has done formal physical therapy in the past.  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  Follow-up again in 4 to 6 weeks

## 2019-05-17 NOTE — Progress Notes (Signed)
[name], [name], including screening questions prior to the visit, [name], [name], [name], PA-C  CC: back pain   [id number]  Jenna [name] is a 54 y.o. female coming in with complaint of back pain. Last seen on 04/04/2019 for OMT. Patient states that she has had cervical spine pain since last visit resultant from sleeping in odd position.       Past Medical History:  Diagnosis Date  . Anemia   . Wears contact lenses    Past Surgical History:  Procedure Laterality Date  . CERVICAL CERCLAGE    . COLONOSCOPY WITH PROPOFOL N/A 05/10/2018   Procedure: COLONOSCOPY WITH PROPOFOL;  Surgeon: Virgel Manifold, MD;  Location: [facility];  Service: Endoscopy;  Laterality: N/A;  . POLYPECTOMY  05/10/2018   Procedure: POLYPECTOMY;  Surgeon: Virgel Manifold, MD;  Location: [facility];  Service: Endoscopy;;   Social History   Socioeconomic History  . Marital status: Divorced    Spouse name: Not on file  . Number of children: Not on file  . Years of education: Not on file  . Highest education level: Not on file  Occupational History  . Not on file  Tobacco Use  . Smoking status: Never Smoker  . Smokeless tobacco: Never Used  Substance and Sexual Activity  . Alcohol use: Yes    Comment: socially - couple x/year  . Drug use: Never  . Sexual activity: Not on file  Other Topics Concern  . Not on file  Social History Narrative  . Not on file   Social Determinants of Health   Financial Resource Strain:   .  Difficulty of Paying Living Expenses:   Food Insecurity:   . Worried About Charity fundraiser in the Last Year:   . Arboriculturist in the Last Year:   Transportation Needs:   . Film/video editor (Medical):   Marland Kitchen Lack of Transportation (Non-Medical):   Physical Activity:   . Days of Exercise per Week:   . Minutes of Exercise per Session:   Stress:   . Feeling of Stress :   Social Connections:   . Frequency of Communication with Friends and Family:   . Frequency of Social Gatherings with Friends and Family:   . Attends Religious Services:   . Active Member of Clubs or Organizations:   . Attends Archivist Meetings:   Marland Kitchen Marital Status:    Allergies  Allergen Reactions  . Sulfa Antibiotics Diarrhea and Nausea And Vomiting   Family History  Problem Relation Age of Onset  . Diabetes Other   . Diabetes Father   . Kidney disease Father   . Heart attack Father     Current Outpatient Medications (Endocrine & Metabolic):  .  levonorgestrel (MIRENA, 52 MG,) 20 MCG/24HR IUD,  .  predniSONE (DELTASONE) 50 MG tablet, Take 1 tablet (50 mg total) by mouth daily. .  predniSONE (DELTASONE) 50 MG tablet, Take one tablet daily for the next 5 days.   Current Outpatient Medications (Respiratory):  .  fluticasone (FLONASE) 50 MCG/ACT nasal spray, Place 2 sprays into both nostrils daily. .  promethazine (PHENERGAN) 25 MG tablet, Take 1 tablet (25 mg total) by mouth every 6 (six) hours as needed for nausea or vomiting.  Current Outpatient Medications (Analgesics):  .  ibuprofen (ADVIL) 600 MG tablet, Take 1 tablet (600 mg total) by mouth every 6 (six) hours as needed.  Current Outpatient Medications (Hematological):  Marland Kitchen  Ferrous Sulfate (IRON) 325 (65 Fe) MG TABS, Take by mouth.  Current Outpatient Medications (Other):  Marland Kitchen  Ascorbic Acid (VITAMIN C PO), Take by mouth daily. Marland Kitchen  BIOTIN PO, Take by mouth daily. .  Diclofenac Sodium 2 % SOLN, Place 2 g onto the skin 2 (two) times  daily. .  Flaxseed, Linseed, (FLAX SEED OIL) 1000 MG CAPS, Take by mouth. .  gabapentin (NEURONTIN) 100 MG capsule, TAKE 2 CAPSULES BY MOUTH EVERY NIGHT AT BEDTIME .  Misc Natural Products (CALCIUM PYRUVATE PO), Take by mouth. .  Misc Natural Products (TART CHERRY ADVANCED PO), Take by mouth. .  Multiple Vitamin (MULTI-VITAMINS) TABS, Take by mouth. .  OIL OF OREGANO PO, Take by mouth daily. .  Vitamin D, Ergocalciferol, (DRISDOL) 1.25 MG (50000 UT) CAPS capsule, TAKE 1 CAPSULE BY MOUTH EVERY 7 DAYS.   Reviewed prior external information including notes and imaging from  primary care provider As well as notes that were available from care everywhere and other healthcare systems.  Past medical history, social, surgical and family history all reviewed in electronic medical record.  No pertanent information unless stated regarding to the chief complaint.   Review of Systems:  No headache, visual changes, nausea, vomiting, diarrhea, constipation, dizziness, abdominal pain, skin rash, fevers, chills, night sweats, weight loss, swollen lymph nodes, body aches, joint swelling, chest pain, shortness of breath, mood changes. POSITIVE muscle aches  Objective  Blood pressure 122/72, pulse 87, height 5\' 8"  (1.727 m), weight 177 lb (80.3 kg), SpO2 97 %.   General: No apparent distress alert and oriented x3 mood and affect normal, dressed appropriately.  HEENT: Pupils equal, extraocular movements intact  Respiratory: Patient's speak in full sentences and does not appear short of breath  Cardiovascular: No lower extremity edema, non tender, no erythema  Skin: Warm dry intact with no signs of infection or rash on extremities or on axial skeleton.  Abdomen: Soft nontender  Neuro: Cranial nerves II through XII are intact, neurovascularly intact in all extremities with 2+ DTRs and 2+ pulses.  Lymph: No lymphadenopathy of posterior or anterior cervical chain or axillae bilaterally.  Gait normal with good  balance and coordination.  MSK:  tender with limited range of motion and good stability and symmetric strength and tone of shoulders, elbows, wrist, hip, knee and ankles bilaterally.   Patient's neck does have some axial lordosis.  Mild crepitus with range of motion.  Negative Spurling's noted today.  Tightness noted in the parascapular region.  Patient shoulder have full range of motion.  5 out of 5 strength in the upper extremities bilaterally.  Low back exam does have some mild loss of lordosis.  Some tender to palpation in paraspinal musculature lumbar spine negative straight leg test.  5 out of 5 strength of lower extremities deep tendon reflexes intact  Osteopathic findings  C2 flexed rotated and side bent right C4 flexed rotated and side bent left T3 extended rotated and side bent right inhaled third rib T9 extended rotated and side bent left L1 flexed rotated and side bent right  L5 flexed rotated and side bent left Sacrum right on right    Impression and Recommendations:     This case required medical decision making of moderate complexity. The above documentation has been reviewed and is accurate and complete [name], DO       Note: This dictation was prepared with Dragon dictation along with smaller phrase technology. Any transcriptional errors that result from this process are unintentional.

## 2019-05-17 NOTE — Assessment & Plan Note (Signed)
Decision today to treat with OMT was based on Physical Exam  After verbal consent patient was treated with HVLA, ME, FPR techniques in cervical, thoracic, rib,  lumbar and sacral areas  Patient tolerated the procedure well with improvement in symptoms  Patient given exercises, stretches and lifestyle modifications  See medications in patient instructions if given  Patient will follow up in 4-8 weeks 

## 2019-05-23 DIAGNOSIS — Z6825 Body mass index (BMI) 25.0-25.9, adult: Secondary | ICD-10-CM | POA: Diagnosis not present

## 2019-05-23 DIAGNOSIS — Z30432 Encounter for removal of intrauterine contraceptive device: Secondary | ICD-10-CM | POA: Diagnosis not present

## 2019-06-06 ENCOUNTER — Other Ambulatory Visit: Payer: Self-pay | Admitting: Family Medicine

## 2019-06-22 ENCOUNTER — Ambulatory Visit: Payer: 59 | Admitting: Family Medicine

## 2019-06-26 ENCOUNTER — Ambulatory Visit: Payer: 59 | Admitting: Family Medicine

## 2019-06-26 ENCOUNTER — Encounter: Payer: Self-pay | Admitting: Family Medicine

## 2019-06-26 ENCOUNTER — Other Ambulatory Visit: Payer: Self-pay

## 2019-06-26 VITALS — BP 110/68 | HR 82 | Ht 68.0 in | Wt 177.0 lb

## 2019-06-26 DIAGNOSIS — M5030 Other cervical disc degeneration, unspecified cervical region: Secondary | ICD-10-CM | POA: Diagnosis not present

## 2019-06-26 DIAGNOSIS — M999 Biomechanical lesion, unspecified: Secondary | ICD-10-CM

## 2019-06-26 NOTE — Progress Notes (Signed)
[place] Phone: [phone] Subjective:   Jenna [name], [name], including screening questions prior to the visit, [name], and extensive cleaning of exam room while observing appropriate contact time as indicated for disinfecting solutions.   I'm seeing this patient by the request  of:  Patient, [name] Pcp Per  CC: Low back pain, neck pain follow-up  [id number]  Jenna [name] is a 54 y.o. female coming in with complaint of back pain. Last seen on 05/17/2019. Patient states that she was at the beach [place] and something bit her right arm. Her neck became painful, burning, and stiff for 2 days.      Past Medical History:  Diagnosis Date  . Anemia   . Wears contact lenses    Past Surgical History:  Procedure Laterality Date  . CERVICAL CERCLAGE    . COLONOSCOPY WITH PROPOFOL N/A 05/10/2018   Procedure: COLONOSCOPY WITH PROPOFOL;  Surgeon: Virgel Manifold, MD;  Location: [facility];  Service: Endoscopy;  Laterality: N/A;  . POLYPECTOMY  05/10/2018   Procedure: POLYPECTOMY;  Surgeon: Virgel Manifold, MD;  Location: [facility];  Service: Endoscopy;;   Social History   Socioeconomic History  . Marital status: Divorced    Spouse name: Not on file  . Number of children: Not on file  . Years of education: Not on file  . Highest education level: Not on file  Occupational History  . Not on file  Tobacco Use  . Smoking status: Never Smoker  . Smokeless tobacco: Never Used  Substance and Sexual Activity  . Alcohol use: Yes    Comment: socially - couple x/year  . Drug use: Never  . Sexual activity: Not on file  Other Topics Concern  . Not on file  Social History Narrative  . Not on file   Social Determinants of  Health   Financial Resource Strain:   . Difficulty of Paying Living Expenses:   Food Insecurity:   . Worried About Charity fundraiser in the Last Year:   . Arboriculturist in the Last Year:   Transportation Needs:   . Film/video editor (Medical):   Marland Kitchen Lack of Transportation (Non-Medical):   Physical Activity:   . Days of Exercise per Week:   . Minutes of Exercise per Session:   Stress:   . Feeling of Stress :   Social Connections:   . Frequency of Communication with Friends and Family:   . Frequency of Social Gatherings with Friends and Family:   . Attends Religious Services:   . Active Member of Clubs or Organizations:   . Attends Archivist Meetings:   Marland Kitchen Marital Status:    Allergies  Allergen Reactions  . Sulfa Antibiotics Diarrhea and Nausea And Vomiting   Family History  Problem Relation Age of Onset  . Diabetes Other   . Diabetes Father   . Kidney disease Father   . Heart attack Father     Current Outpatient Medications (Endocrine & Metabolic):  .  predniSONE (DELTASONE) 50 MG tablet, Take 1 tablet (50 mg total) by mouth daily. Marland Kitchen  levonorgestrel (MIRENA, 52 MG,) 20 MCG/24HR IUD,  .  predniSONE (DELTASONE) 50 MG tablet, Take one tablet daily for the next 5 days.  Current Outpatient Medications (Respiratory):  .  fluticasone (FLONASE) 50 MCG/ACT nasal spray, Place 2 sprays into both nostrils daily. .  promethazine (PHENERGAN) 25 MG tablet, Take 1 tablet (25 mg total) by mouth every 6 (six) hours as needed for nausea or vomiting.  Current Outpatient Medications (Analgesics):  .  ibuprofen (ADVIL) 600 MG tablet, Take 1 tablet (600 mg total) by mouth every 6 (six) hours as needed.  Current Outpatient Medications (Hematological):  Marland Kitchen  Ferrous Sulfate (IRON) 325 (65 Fe) MG TABS, Take by mouth.  Current Outpatient Medications (Other):  Marland Kitchen  Ascorbic Acid (VITAMIN C PO), Take by mouth daily. Marland Kitchen  BIOTIN PO, Take by mouth daily. .  Diclofenac Sodium 2 %  SOLN, Place 2 g onto the skin 2 (two) times daily. .  Flaxseed, Linseed, (FLAX SEED OIL) 1000 MG CAPS, Take by mouth. .  gabapentin (NEURONTIN) 100 MG capsule, TAKE 2 CAPSULES BY MOUTH EVERY NIGHT AT BEDTIME .  Misc Natural Products (CALCIUM PYRUVATE PO), Take by mouth. .  Misc Natural Products (TART CHERRY ADVANCED PO), Take by mouth. .  Multiple Vitamin (MULTI-VITAMINS) TABS, Take by mouth. .  OIL OF OREGANO PO, Take by mouth daily. .  Vitamin D, Ergocalciferol, (DRISDOL) 1.25 MG (50000 UNIT) CAPS capsule, TAKE 1 CAPSULE BY MOUTH EVERY 7 DAYS.   Reviewed prior external information including notes and imaging from  primary care provider As well as notes that were available from care everywhere and other healthcare systems.  Past medical history, social, surgical and family history all reviewed in electronic medical record.  [name] unless stated regarding to the chief complaint.   Review of Systems:  [name] headache, visual changes, nausea, vomiting, diarrhea, constipation, dizziness, abdominal pain, skin rash, fevers, chills, night sweats, weight loss, swollen lymph nodes, body aches, joint swelling, chest pain, shortness of breath, mood changes. POSITIVE muscle aches  Objective  Blood pressure 110/68, pulse 82, height 5\' 8"  (1.727 m), weight 177 lb (80.3 kg), SpO2 96 %.   General: [name] mood and affect normal, dressed appropriately.  HEENT: Pupils equal, extraocular movements intact  Respiratory: Patient's speak in full sentences and does not appear short of breath  Cardiovascular: [name] lower extremity edema, non tender, [name] erythema  Neuro: Cranial nerves II through XII are intact, neurovascularly intact in all extremities with 2+ DTRs and 2+ pulses.  Gait normal with good balance and coordination.  MSK:  Non tender with full range of motion and good stability and symmetric strength and tone of shoulders, elbows, wrist, hip, knee and  ankles bilaterally.  Neck pain does have some loss of lordosis.  Some tender to palpation in the paraspinal musculature lumbar spine right greater than left.  Negative Spurling's.  5-5 strength of the upper extremities.  Osteopathic findings  C2 flexed rotated and side bent right T3 extended rotated and side bent right inhaled third rib T6 extended rotated and side bent left L2 flexed rotated and side bent right Sacrum right on right     Impression and Recommendations:     This case required medical decision making of moderate complexity. The above documentation has been reviewed and is accurate and complete Jenna Pulley, DO       Note: This dictation was prepared with Dragon dictation along with smaller phrase technology. Any transcriptional errors that result from this process are unintentional.

## 2019-06-26 NOTE — Patient Instructions (Signed)
See me in 5 weeks Stay way from bugs!

## 2019-06-26 NOTE — Assessment & Plan Note (Signed)

## 2019-06-26 NOTE — Assessment & Plan Note (Signed)
Chronic problem with exacerbation.  Discussed which activities to do which wants to avoid.  Patient should increase activity slowly over the course the next several weeks.  Discussed icing regimen, topical anti-inflammatories.  Discussed the gabapentin continued chronic medications as well as the ibuprofen.  Follow-up again 6 to 8 weeks

## 2019-06-30 ENCOUNTER — Other Ambulatory Visit: Payer: Self-pay | Admitting: Family Medicine

## 2019-07-28 ENCOUNTER — Ambulatory Visit: Payer: 59 | Admitting: Family Medicine

## 2019-08-07 ENCOUNTER — Ambulatory Visit: Payer: 59 | Admitting: Family Medicine

## 2019-08-09 ENCOUNTER — Ambulatory Visit: Payer: 59 | Admitting: Family Medicine

## 2019-08-09 ENCOUNTER — Encounter: Payer: Self-pay | Admitting: Family Medicine

## 2019-08-09 ENCOUNTER — Other Ambulatory Visit: Payer: Self-pay

## 2019-08-09 VITALS — BP 112/66 | HR 82 | Ht 68.0 in | Wt 181.0 lb

## 2019-08-09 DIAGNOSIS — M999 Biomechanical lesion, unspecified: Secondary | ICD-10-CM | POA: Diagnosis not present

## 2019-08-09 DIAGNOSIS — M5030 Other cervical disc degeneration, unspecified cervical region: Secondary | ICD-10-CM

## 2019-08-09 NOTE — Assessment & Plan Note (Signed)

## 2019-08-09 NOTE — Progress Notes (Signed)
Jenna [name] Phone: [phone] Subjective:   Jenna [name], [name], including screening questions prior to the visit, [name], and extensive cleaning of exam room while observing appropriate contact time as indicated for disinfecting solutions.   I'm seeing this patient by the request  of:  Patient, [name] Pcp Per  CC: Neck pain and back pain follow-up  [id number]  Jenna [name] is a 54 y.o. female coming in with complaint of back pain. Last seen on 06/26/2019 for OMT. Patient states that she had to take Meloxicam 2x since last visit. Otherwise is doing well today. Patient states that has been a little bit more active.  Feels like that has been beneficial.     Past Medical History:  Diagnosis Date  . Anemia   . Wears contact lenses    Past Surgical History:  Procedure Laterality Date  . CERVICAL CERCLAGE    . COLONOSCOPY WITH PROPOFOL N/A 05/10/2018   Procedure: COLONOSCOPY WITH PROPOFOL;  Surgeon: Virgel Manifold, MD;  Location: [facility];  Service: Endoscopy;  Laterality: N/A;  . POLYPECTOMY  05/10/2018   Procedure: POLYPECTOMY;  Surgeon: Virgel Manifold, MD;  Location: [facility];  Service: Endoscopy;;   Social History   Socioeconomic History  . Marital status: Divorced    Spouse name: Not on file  . Number of children: Not on file  . Years of education: Not on file  . Highest education level: Not on file  Occupational History  . Not on file  Tobacco Use  . Smoking status: Never Smoker  . Smokeless tobacco: Never Used  Substance and Sexual Activity  . Alcohol use: Yes    Comment: socially - couple x/year  . Drug use: Never  . Sexual activity: Not on file  Other Topics Concern  . Not on file  Social History  Narrative  . Not on file   Social Determinants of Health   Financial Resource Strain:   . Difficulty of Paying Living Expenses:   Food Insecurity:   . Worried About Charity fundraiser in the Last Year:   . Arboriculturist in the Last Year:   Transportation Needs:   . Film/video editor (Medical):   Marland Kitchen Lack of Transportation (Non-Medical):   Physical Activity:   . Days of Exercise per Week:   . Minutes of Exercise per Session:   Stress:   . Feeling of Stress :   Social Connections:   . Frequency of Communication with Friends and Family:   . Frequency of Social Gatherings with Friends and Family:   . Attends Religious Services:   . Active Member of Clubs or Organizations:   . Attends Archivist Meetings:   Marland Kitchen Marital Status:    Allergies  Allergen Reactions  . Sulfa Antibiotics Diarrhea and Nausea And Vomiting   Family History  Problem Relation Age of Onset  . Diabetes Other   . Diabetes Father   . Kidney disease Father   . Heart attack Father     Current Outpatient Medications (Endocrine & Metabolic):  .  predniSONE (DELTASONE) 50 MG tablet, Take 1 tablet (50 mg total) by mouth daily. Marland Kitchen  levonorgestrel (MIRENA, 52 MG,) 20 MCG/24HR IUD,  .  predniSONE (DELTASONE) 50 MG tablet, Take  one tablet daily for the next 5 days.   Current Outpatient Medications (Respiratory):  .  fluticasone (FLONASE) 50 MCG/ACT nasal spray, Place 2 sprays into both nostrils daily. .  promethazine (PHENERGAN) 25 MG tablet, Take 1 tablet (25 mg total) by mouth every 6 (six) hours as needed for nausea or vomiting.  Current Outpatient Medications (Analgesics):  .  ibuprofen (ADVIL) 600 MG tablet, Take 1 tablet (600 mg total) by mouth every 6 (six) hours as needed. .  meloxicam (MOBIC) 15 MG tablet, TAKE 1 TABLET BY MOUTH DAILY AS NEEDED  Current Outpatient Medications (Hematological):  Marland Kitchen  Ferrous Sulfate (IRON) 325 (65 Fe) MG TABS, Take by mouth.  Current Outpatient Medications  (Other):  Marland Kitchen  Ascorbic Acid (VITAMIN C PO), Take by mouth daily. Marland Kitchen  BIOTIN PO, Take by mouth daily. .  Diclofenac Sodium 2 % SOLN, Place 2 g onto the skin 2 (two) times daily. .  Flaxseed, Linseed, (FLAX SEED OIL) 1000 MG CAPS, Take by mouth. .  gabapentin (NEURONTIN) 100 MG capsule, TAKE 2 CAPSULES BY MOUTH EVERY NIGHT AT BEDTIME .  Misc Natural Products (CALCIUM PYRUVATE PO), Take by mouth. .  Misc Natural Products (TART CHERRY ADVANCED PO), Take by mouth. .  Multiple Vitamin (MULTI-VITAMINS) TABS, Take by mouth. .  OIL OF OREGANO PO, Take by mouth daily. .  Vitamin D, Ergocalciferol, (DRISDOL) 1.25 MG (50000 UNIT) CAPS capsule, TAKE 1 CAPSULE BY MOUTH EVERY 7 DAYS.   Reviewed prior external information including notes and imaging from  primary care provider As well as notes that were available from care everywhere and other healthcare systems.  Past medical history, social, surgical and family history all reviewed in electronic medical record.  [name] unless stated regarding to the chief complaint.   Review of Systems:  [name]  visual changes, nausea, vomiting, diarrhea, constipation, dizziness, abdominal pain, skin rash, fevers, chills, night sweats, weight loss, swollen lymph nodes, body aches, joint swelling, chest pain, shortness of breath, mood changes. POSITIVE muscle aches, headaches  Objective  Blood pressure 112/66, pulse 82, height 5\' 8"  (1.727 m), weight 181 lb (82.1 kg), SpO2 99 %.   General: [name] mood and affect normal, dressed appropriately.  HEENT: Pupils equal, extraocular movements intact  Respiratory: Patient's speak in full sentences and does not appear short of breath  Cardiovascular: [name] lower extremity edema, non tender, [name] erythema  Neuro: Cranial nerves II through XII are intact, neurovascularly intact in all extremities with 2+ DTRs and 2+ pulses.  Gait normal with good balance and coordination.  MSK:  Non  tender with full range of motion and good stability and symmetric strength and tone of shoulders, elbows, wrist, hip, knee and ankles bilaterally.  Mild loss of lordosis of the neck.  Tender to palpation in the parascapular region.  Continued discomfort in the parascapular region with mild trigger points noted.  Osteopathic findings  C2 flexed rotated and side bent right C6 flexed rotated and side bent left T3 extended rotated and side bent right inhaled third rib T8 extended rotated and side bent left L2 flexed rotated and side bent right Sacrum right on right    Impression and Recommendations:     The above documentation has been reviewed and is accurate and complete Jenna Pulley, DO       Note: This dictation was prepared with Dragon dictation along with smaller phrase technology. Any transcriptional errors that result from this process are unintentional.

## 2019-08-09 NOTE — Assessment & Plan Note (Signed)
History of degenerative disc disease.  Has responded well to manipulation previously.  We discussed gabapentin under the regular dose.  Discussed taking them on a regular basis as well.  Ibuprofen for breakthrough pain.  Patient will follow up again in 8 to 10 weeks

## 2019-08-09 NOTE — Patient Instructions (Signed)
Message me if you have more cramping in your legs See me in 8 weeks

## 2019-08-31 DIAGNOSIS — H2513 Age-related nuclear cataract, bilateral: Secondary | ICD-10-CM | POA: Diagnosis not present

## 2019-09-18 ENCOUNTER — Encounter: Payer: Self-pay | Admitting: Family Medicine

## 2019-09-25 ENCOUNTER — Other Ambulatory Visit: Payer: Self-pay | Admitting: Family Medicine

## 2019-10-03 ENCOUNTER — Ambulatory Visit: Payer: 59 | Admitting: Family Medicine

## 2019-10-03 ENCOUNTER — Other Ambulatory Visit: Payer: Self-pay

## 2019-10-03 ENCOUNTER — Encounter: Payer: Self-pay | Admitting: Family Medicine

## 2019-10-03 VITALS — BP 110/78 | HR 80 | Ht 68.0 in | Wt 176.0 lb

## 2019-10-03 DIAGNOSIS — M999 Biomechanical lesion, unspecified: Secondary | ICD-10-CM | POA: Diagnosis not present

## 2019-10-03 DIAGNOSIS — M5030 Other cervical disc degeneration, unspecified cervical region: Secondary | ICD-10-CM

## 2019-10-03 NOTE — Assessment & Plan Note (Signed)
Chronic problem is stable.  Gabapentin still seems to be stable.  Intermittent ibuprofen issues.  Does respond well to manipulation.  Discussed icing regimen.  Very mild exacerbation she states over the course last week but stable long-term.  Follow-up again 2 to 3 months

## 2019-10-03 NOTE — Progress Notes (Signed)
[place] Phone: [phone] Subjective:   [name], [name], including screening questions prior to the visit, [name], and extensive cleaning of exam room while observing appropriate contact time as indicated for disinfecting solutions.   I'm seeing this patient by the request  of:  Patient, No Pcp Per  CC: Neck and back pain follow-up  [id number]  Jenna [name] is a 54 y.o. female coming in with complaint of back and neck pain Patient states that she is noticing tightness in neck.   Medications patient has been prescribed: Gabapentin and ibuprofen  Taking: Yes         Reviewed prior external information including notes and imaging from previsou exam, outside providers and external EMR if available.   As well as notes that were available from care everywhere and other healthcare systems.  Past medical history, social, surgical and family history all reviewed in electronic medical record.  No pertanent information unless stated regarding to the chief complaint.   Past Medical History:  Diagnosis Date  . Anemia   . Wears contact lenses     Allergies  Allergen Reactions  . Sulfa Antibiotics Diarrhea and Nausea And Vomiting     Review of Systems:  No headache, visual changes, nausea, vomiting, diarrhea, constipation, dizziness, abdominal pain, skin rash, fevers, chills, night sweats, weight loss, swollen lymph nodes, body aches, joint swelling, chest pain, shortness of breath, mood changes. POSITIVE muscle aches  Objective  There were no vitals taken for this visit.   General: No apparent distress alert and oriented x3 mood and affect normal, dressed appropriately.  HEENT: Pupils equal, extraocular movements intact  Respiratory:  Patient's speak in full sentences and does not appear short of breath  Cardiovascular: No lower extremity edema, non tender, no erythema  Neuro: Cranial nerves II through XII are intact, neurovascularly intact in all extremities with 2+ DTRs and 2+ pulses.  Gait normal with good balance and coordination.  MSK:  Non tender with full range of motion and good stability and symmetric strength and tone of shoulders, elbows, wrist, hip, knee and ankles bilaterally.  Back -neck exam does show the patient does have some mild loss of.  Still some mild tightness with sidebending bilaterally.  Negative Spurling's though noted today.  5 out of 5 strength of the upper extremities.  Deep tendon reflexes intact  Low back exam shows some tenderness to palpation in the paraspinal musculature lumbar spine right greater than left.  Negative straight leg test but tightness with FABER test.  Pain a little bit in the thoracolumbar juncture    Osteopathic findings  C2 flexed rotated and side bent right C7 flexed rotated and side bent left T3 extended rotated and side bent right inhaled rib T9 extended rotated and side bent left L4 flexed rotated and side bent left Sacrum right on right       Assessment and Plan:    Nonallopathic problems  Decision today to treat with OMT was based on Physical Exam  After verbal consent patient was treated with HVLA, ME, FPR techniques in cervical, rib, thoracic, lumbar, and sacral  areas  Patient tolerated the procedure well with improvement in symptoms  Patient given exercises, stretches and lifestyle modifications  See medications in patient instructions if given  Patient will follow up in 4-8 weeks      The above documentation has been reviewed and is accurate and complete [name], DO       Note: This dictation was prepared with Dragon dictation along with smaller phrase technology. Any transcriptional errors that result from this process are  unintentional.

## 2019-10-03 NOTE — Patient Instructions (Signed)
Congrats on graduation See me in 2-3 months

## 2019-10-19 ENCOUNTER — Encounter: Payer: Self-pay | Admitting: Family Medicine

## 2019-10-20 ENCOUNTER — Ambulatory Visit: Payer: 59 | Admitting: Family Medicine

## 2019-10-27 ENCOUNTER — Other Ambulatory Visit: Payer: 59

## 2019-10-27 ENCOUNTER — Other Ambulatory Visit: Payer: Self-pay | Admitting: Sleep Medicine

## 2019-10-27 DIAGNOSIS — Z20822 Contact with and (suspected) exposure to covid-19: Secondary | ICD-10-CM

## 2019-10-28 LAB — SARS-COV-2, NAA 2 DAY TAT

## 2019-10-28 LAB — NOVEL CORONAVIRUS, NAA: SARS-CoV-2, NAA: NOT DETECTED

## 2019-11-24 ENCOUNTER — Encounter: Payer: Self-pay | Admitting: Family Medicine
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
## 2019-11-29 ENCOUNTER — Ambulatory Visit: Payer: 59 | Admitting: Family Medicine

## 2019-12-12 ENCOUNTER — Other Ambulatory Visit: Payer: Self-pay | Admitting: Family Medicine

## 2019-12-12 NOTE — Progress Notes (Signed)
Jenna Jenna Phone: [phone] Subjective:   Jenna Jenna, [name], including screening questions prior to the visit, [name], and extensive cleaning of exam room while observing appropriate contact time as indicated for disinfecting solutions.   I'm seeing this patient by the request  of:  Patient, Jenna Jenna  CC: back and neck pain   [id number]  Jenna Jenna is a 54 y.o. female coming in with complaint of back and neck pain. OMT 10/23/2019. Patient states that she had to use Meloxicam for 3 days when she had flare. Overall doing well   Medications patient has been prescribed: Meloxicam, Vit D          Reviewed prior external information including notes and imaging from previsou exam, outside providers and external EMR if available.   As well as notes that were available from care everywhere and other healthcare systems.  Past medical history, social, surgical and family history all reviewed in electronic medical record.  Jenna pertanent information unless stated regarding to the chief complaint.   Past Medical History:  Diagnosis Date  . Anemia   . Wears contact lenses     Allergies  Allergen Reactions  . Sulfa Antibiotics Diarrhea and Nausea And Vomiting     Review of Systems:  Jenna headache, visual changes, nausea, vomiting, diarrhea, constipation, dizziness, abdominal pain, skin rash, fevers, chills, night sweats, weight loss, swollen lymph nodes, body aches, joint swelling, chest pain, shortness of breath, mood changes. POSITIVE muscle aches  Objective  Blood pressure 112/84, pulse 96, height 5\' 8"  (1.727 m), weight 170 lb (77.1 kg), SpO2 98 %.   General: Jenna apparent distress alert and oriented x3 mood and affect normal, dressed  appropriately.  HEENT: Pupils equal, extraocular movements intact  Respiratory: Patient's speak in full sentences and does not appear short of breath  Cardiovascular: Jenna lower extremity edema, non tender, Jenna erythema  Neuro: Cranial nerves II through XII are intact, neurovascularly intact in all extremities with 2+ DTRs and 2+ pulses.  Gait normal with good balance and coordination.  MSK:  Non tender with full range of motion and good stability and symmetric strength and tone of shoulders, elbows, wrist, hip, knee and ankles bilaterally.  Back -mild tightness is right SI joint _ faber, Neg SLT  Osteopathic findings  C7 flexed rotated and side bent left T3 extended rotated and side bent right inhaled rib T5 extended rotated and side bent left L2 flexed rotated and side bent right Sacrum right on right       Assessment and Plan:    Nonallopathic problems  Decision today to treat with OMT was based on Physical Exam  After verbal consent patient was treated with HVLA, ME, FPR techniques in cervical, rib, thoracic, lumbar, and sacral  areas  Patient tolerated the procedure well with improvement in symptoms  Patient given exercises, stretches and lifestyle modifications  See medications in patient instructions if given  Patient will follow up in 4-8 weeks      The above documentation has been reviewed and is accurate and complete Jenna Pulley, DO       Note: This dictation was prepared with Dragon dictation along with smaller phrase technology. Any transcriptional errors that result from this process are unintentional.

## 2019-12-13 ENCOUNTER — Encounter: Payer: Self-pay | Admitting: Family Medicine

## 2019-12-13 ENCOUNTER — Other Ambulatory Visit: Payer: Self-pay

## 2019-12-13 ENCOUNTER — Ambulatory Visit: Payer: 59 | Admitting: Family Medicine

## 2019-12-13 VITALS — BP 112/84 | HR 96 | Ht 68.0 in | Wt 170.0 lb

## 2019-12-13 DIAGNOSIS — M533 Sacrococcygeal disorders, not elsewhere classified: Secondary | ICD-10-CM

## 2019-12-13 DIAGNOSIS — M999 Biomechanical lesion, unspecified: Secondary | ICD-10-CM | POA: Diagnosis not present

## 2019-12-13 NOTE — Patient Instructions (Signed)
Keep using Meloxicam in bursts when needed See me in 2-3 months

## 2019-12-13 NOTE — Assessment & Plan Note (Signed)
Chronic problem, mild exacerbation discussed meloxicam, vitamin D, proper ergonomics, doing well f/u 2-3 months

## 2019-12-25 DIAGNOSIS — Z1231 Encounter for screening mammogram for malignant neoplasm of breast: Secondary | ICD-10-CM | POA: Diagnosis not present

## 2019-12-25 DIAGNOSIS — Z006 Encounter for examination for normal comparison and control in clinical research program: Secondary | ICD-10-CM | POA: Diagnosis not present

## 2020-02-13 ENCOUNTER — Encounter: Payer: Self-pay | Admitting: Family Medicine

## 2020-02-13 NOTE — Progress Notes (Signed)
[facility] [address] [place] Phone: [phone] Subjective:   I Jenna [name] am serving as a Education administrator for Dr. Hulan Saas.  This visit occurred during the SARS-CoV-2 public health emergency.  Safety protocols were in place, including screening questions prior to the visit, [name], and extensive cleaning of exam room while observing appropriate contact time as indicated for disinfecting solutions.   I'm seeing this patient by the request  of:  Patient, No Pcp Per  CC: Back and neck pain follow-up  [id number]  Jenna [name] is a 54 y.o. female coming in with complaint of back and neck pain. OMT 12/13/2019. Patient states she is not doing too bad. Some stiffness. States she feels fatigue and has some hip pain bilaterally.  Patient states it feels similar when she was having the low iron.  Patient continues to take all the vitamin supplementations.  Has changed diet recently to more plant-based  Medications patient has been prescribed: Vit D  Taking: Yes         Reviewed prior external information including notes and imaging from previsou exam, outside providers and external EMR if available.   As well as notes that were available from care everywhere and other healthcare systems.  Past medical history, social, surgical and family history all reviewed in electronic medical record.  No pertanent information unless stated regarding to the chief complaint.   Past Medical History:  Diagnosis Date  . Anemia   . Wears contact lenses     Allergies  Allergen Reactions  . Sulfa Antibiotics Diarrhea and Nausea And Vomiting     Review of Systems:  No headache, visual changes, nausea, vomiting, diarrhea, constipation, dizziness, abdominal pain, skin rash, fevers, chills, night sweats, weight loss, swollen lymph nodes, body aches, joint swelling, chest pain, shortness of breath, mood changes. POSITIVE  muscle aches  Objective  Blood pressure 110/90, pulse 74, height 5\' 8"  (1.727 m), weight 168 lb (76.2 kg), SpO2 98 %.   General: No apparent distress alert and oriented x3 mood and affect normal, dressed appropriately.  HEENT: Pupils equal, extraocular movements intact  Respiratory: Patient's speak in full sentences and does not appear short of breath  Cardiovascular: No lower extremity edema, non tender, no erythema  Neuro: Cranial nerves II through XII are intact, neurovascularly intact in all extremities with 2+ DTRs and 2+ pulses.  Gait normal with good balance and coordination.  MSK:  Non tender with full range of motion and good stability and symmetric strength and tone of shoulders, elbows, wrist, hip, knee and ankles bilaterally.  Back - Normal skin, Spine with normal alignment and no deformity.  No tenderness to vertebral process palpation.  Paraspinous muscles are not tender and without spasm.   Range of motion is full at neck and lumbar sacral regions  Osteopathic findings  C2 flexed rotated and side bent right C6 flexed rotated and side bent left T3 extended rotated and side bent right inhaled rib T9 extended rotated and side bent left L2 flexed rotated and side bent right Sacrum right on right       Assessment and Plan:  SI (sacroiliac) joint dysfunction Chronic, with mild exacerbation.  Patient is having more discomfort at that level.  We did discuss the meloxicam.  Discussed that this could be more beneficial for her.  Patient will continue with the vitamin D.  Trying some changes in her diet.  Has had difficulty with low  iron previously.  We will get laboratory work-up to further evaluate.  Responding well though to osteopathic manipulation.  Follow-up again with me in 6 to 8 weeks    Nonallopathic problems  Decision today to treat with OMT was based on Physical Exam  After verbal consent patient was treated with HVLA, ME, FPR techniques in cervical, rib,  thoracic, lumbar, and sacral  areas  Patient tolerated the procedure well with improvement in symptoms  Patient given exercises, stretches and lifestyle modifications  See medications in patient instructions if given  Patient will follow up in 4-8 weeks      The above documentation has been reviewed and is accurate and complete Jenna Pulley, DO       Note: This dictation was prepared with Dragon dictation along with smaller phrase technology. Any transcriptional errors that result from this process are unintentional.

## 2020-02-14 ENCOUNTER — Other Ambulatory Visit: Payer: 59

## 2020-02-14 ENCOUNTER — Ambulatory Visit: Payer: 59 | Admitting: Family Medicine

## 2020-02-14 ENCOUNTER — Other Ambulatory Visit: Payer: Self-pay

## 2020-02-14 ENCOUNTER — Encounter: Payer: Self-pay | Admitting: Family Medicine

## 2020-02-14 VITALS — BP 110/90 | HR 74 | Ht 68.0 in | Wt 168.0 lb

## 2020-02-14 DIAGNOSIS — Z20822 Contact with and (suspected) exposure to covid-19: Secondary | ICD-10-CM

## 2020-02-14 DIAGNOSIS — M2550 Pain in unspecified joint: Secondary | ICD-10-CM

## 2020-02-14 DIAGNOSIS — M999 Biomechanical lesion, unspecified: Secondary | ICD-10-CM

## 2020-02-14 DIAGNOSIS — M533 Sacrococcygeal disorders, not elsewhere classified: Secondary | ICD-10-CM | POA: Diagnosis not present

## 2020-02-14 LAB — CBC WITH DIFFERENTIAL/PLATELET
Basophils Absolute: 0 10*3/uL (ref 0.0–0.1)
Basophils Relative: 0.8 % (ref 0.0–3.0)
Eosinophils Absolute: 0.2 10*3/uL (ref 0.0–0.7)
Eosinophils Relative: 3.6 % (ref 0.0–5.0)
HCT: 39.3 % (ref 36.0–46.0)
Hemoglobin: 12.8 g/dL (ref 12.0–15.0)
Lymphocytes Relative: 48 % — ABNORMAL HIGH (ref 12.0–46.0)
Lymphs Abs: 2.9 10*3/uL (ref 0.7–4.0)
MCHC: 32.7 g/dL (ref 30.0–36.0)
MCV: 87.5 fl (ref 78.0–100.0)
Monocytes Absolute: 0.4 10*3/uL (ref 0.1–1.0)
Monocytes Relative: 6.6 % (ref 3.0–12.0)
Neutro Abs: 2.5 10*3/uL (ref 1.4–7.7)
Neutrophils Relative %: 41 % — ABNORMAL LOW (ref 43.0–77.0)
Platelets: 192 10*3/uL (ref 150.0–400.0)
RBC: 4.49 Mil/uL (ref 3.87–5.11)
RDW: 13.3 % (ref 11.5–15.5)
WBC: 6.2 10*3/uL (ref 4.0–10.5)

## 2020-02-14 LAB — IBC PANEL
Iron: 92 ug/dL (ref 42–145)
Saturation Ratios: 32.9 % (ref 20.0–50.0)
Transferrin: 200 mg/dL — ABNORMAL LOW (ref 212.0–360.0)

## 2020-02-14 LAB — FERRITIN: Ferritin: 131.1 ng/mL (ref 10.0–291.0)

## 2020-02-14 LAB — VITAMIN D 25 HYDROXY (VIT D DEFICIENCY, FRACTURES): VITD: 56.5 ng/mL (ref 30.00–100.00)

## 2020-02-14 LAB — TSH: TSH: 1.23 u[IU]/mL (ref 0.35–4.50)

## 2020-02-14 NOTE — Patient Instructions (Addendum)
Good to see you Labs today Continue exercises and vitamins Use the standing desk See me again in 6-8 weeks

## 2020-02-14 NOTE — Assessment & Plan Note (Signed)
Chronic, with mild exacerbation.  Patient is having more discomfort at that level.  We did discuss the meloxicam.  Discussed that this could be more beneficial for her.  Patient will continue with the vitamin D.  Trying some changes in her diet.  Has had difficulty with low iron previously.  We will get laboratory work-up to further evaluate.  Responding well though to osteopathic manipulation.  Follow-up again with me in 6 to 8 weeks

## 2020-02-15 ENCOUNTER — Encounter: Payer: Self-pay | Admitting: Family Medicine

## 2020-02-16 ENCOUNTER — Other Ambulatory Visit: Payer: Self-pay | Admitting: Family Medicine

## 2020-02-16 LAB — NOVEL CORONAVIRUS, NAA: SARS-CoV-2, NAA: NOT DETECTED

## 2020-02-16 LAB — SARS-COV-2, NAA 2 DAY TAT

## 2020-02-19 ENCOUNTER — Other Ambulatory Visit: Payer: Self-pay | Admitting: Family Medicine

## 2020-03-26 ENCOUNTER — Other Ambulatory Visit: Payer: Self-pay | Admitting: Dentist

## 2020-03-26 NOTE — Progress Notes (Unsigned)
[place] Phone: [phone] Subjective:   [name], [name], including screening questions prior to the visit, [name], and extensive cleaning of exam room while observing appropriate contact time as indicated for disinfecting solutions.   I'm seeing this patient by the request  of:  Patient, No Pcp Per  CC: Back and neck pain follow-up  [id number]  Jenna [name] is a 55 y.o. female coming in with complaint of back and neck pain. OMT 02/14/2020. Patient states that she is tight. Also having left shoulder weakness for past 2 weeks. Pain with flexion in posterior aspect of shoulder. Patient feels like she slept wrong on the shoulder. Denies any radiating symptoms. Using mobic for pain relief.    Patient previously did have more of an infraspinatus tear of the left rotator cuff and questionable intersubstance tearing.  MRI of 2019 did show that patient did have subacromial bursitis with an inferior labral tear.  And more of the intrasubstance tearing noted of the supraspinatus.   Medications patient has been prescribed: Meloxicam, Vit D  Taking:         Reviewed prior external information including notes and imaging from previsou exam, outside providers and external EMR if available.   As well as notes that were available from care everywhere and other healthcare systems.  Past medical history, social, surgical and family history all reviewed in electronic medical record.  No pertanent information unless stated regarding to the chief complaint.   Past Medical History:  Diagnosis Date  . Anemia   . Wears contact lenses     Allergies  Allergen Reactions  . Sulfa Antibiotics Diarrhea and Nausea And Vomiting     Review of Systems:  No  headache, visual changes, nausea, vomiting, diarrhea, constipation, dizziness, abdominal pain, skin rash, fevers, chills, night sweats, weight loss, swollen lymph nodes, body aches, joint swelling, chest pain, shortness of breath, mood changes. POSITIVE muscle aches  Objective  Blood pressure 110/74, pulse 83, height 5\' 8"  (1.727 m), weight 170 lb (77.1 kg), SpO2 98 %.   General: No apparent distress alert and oriented x3 mood and affect normal, dressed appropriately.  HEENT: Pupils equal, extraocular movements intact  Respiratory: Patient's speak in full sentences and does not appear short of breath  Cardiovascular: No lower extremity edema, non tender, no erythema  Neuro: Cranial nerves II through XII are intact, neurovascularly intact in all extremities with 2+ DTRs and 2+ pulses.  Gait normal with good balance and coordination.  MSK: Left shoulder exam shows the patient does have some voluntary guarding noted.  Mild positive impingement noted.  Patient does have tightness to the left side of the neck.  Negative Spurling's. Low back exam does have some tightness noted in the paraspinal musculature.  Seems to be tight around the right sacroiliac joint than usual.  Negative straight leg test.  Osteopathic findings  C6 flexed rotated and side bent left T3 extended rotated and side bent left inhaled rib T9 extended rotated and side bent left L2 flexed rotated and side bent right Sacrum right on right       Assessment and Plan:  Left rotator cuff tear Patient has had a history of a left rotator cuff tear previously. Had more of an infraspinatus tear. Patient seemed to do  well with conservative therapy. We discussed meloxicam and use it on a more regular aspect. Patient is going to do a burst of the meloxicam as well as home exercises. X-rays ordered today. Follow-up again in 4 to 5 weeks.   Patient previously 2 years ago did respond somewhat to nitroglycerin.  DDD (degenerative disc  disease), cervical Known degenerative disc disease.  We will get new x-rays.  Could be more cervical radiculopathy given some of the trouble with the shoulder pain as well.  Encourage patient to take meloxicam on a more regular basis for the next 10 days.  Discussed icing regimen and home exercises.  Worsening symptoms can always consider the possibility of MRI but I think patient will do well with conservative therapy.  Follow-up again in 4 to 5 weeks    Nonallopathic problems  Decision today to treat with OMT was based on Physical Exam  After verbal consent patient was treated with HVLA, ME, FPR techniques in cervical, rib, thoracic, lumbar, and sacral  areas  Patient tolerated the procedure well with improvement in symptoms  Patient given exercises, stretches and lifestyle modifications  See medications in patient instructions if given  Patient will follow up in 4-8 weeks      The above documentation has been reviewed and is accurate and complete [name], DO       Note: This dictation was prepared with Dragon dictation along with smaller phrase technology. Any transcriptional errors that result from this process are unintentional.

## 2020-03-27 ENCOUNTER — Ambulatory Visit: Payer: 59 | Admitting: Family Medicine

## 2020-03-27 ENCOUNTER — Ambulatory Visit (INDEPENDENT_AMBULATORY_CARE_PROVIDER_SITE_OTHER): Payer: 59

## 2020-03-27 ENCOUNTER — Other Ambulatory Visit: Payer: Self-pay

## 2020-03-27 ENCOUNTER — Encounter: Payer: Self-pay | Admitting: Family Medicine

## 2020-03-27 VITALS — BP 110/74 | HR 83 | Ht 68.0 in | Wt 170.0 lb

## 2020-03-27 DIAGNOSIS — M47812 Spondylosis without myelopathy or radiculopathy, cervical region: Secondary | ICD-10-CM | POA: Diagnosis not present

## 2020-03-27 DIAGNOSIS — M542 Cervicalgia: Secondary | ICD-10-CM

## 2020-03-27 DIAGNOSIS — G8929 Other chronic pain: Secondary | ICD-10-CM

## 2020-03-27 DIAGNOSIS — M999 Biomechanical lesion, unspecified: Secondary | ICD-10-CM

## 2020-03-27 DIAGNOSIS — M25512 Pain in left shoulder: Secondary | ICD-10-CM

## 2020-03-27 DIAGNOSIS — M19012 Primary osteoarthritis, left shoulder: Secondary | ICD-10-CM | POA: Diagnosis not present

## 2020-03-27 DIAGNOSIS — M5030 Other cervical disc degeneration, unspecified cervical region: Secondary | ICD-10-CM

## 2020-03-27 DIAGNOSIS — M75112 Incomplete rotator cuff tear or rupture of left shoulder, not specified as traumatic: Secondary | ICD-10-CM

## 2020-03-27 NOTE — Patient Instructions (Signed)
Xray today Exercise 3x a week Meloxicam daily for next 10 days then as needed See me in 4-5 weeks

## 2020-03-27 NOTE — Assessment & Plan Note (Signed)
Patient has had a history of a left rotator cuff tear previously. Had more of an infraspinatus tear. Patient seemed to do well with conservative therapy. We discussed meloxicam and use it on a more regular aspect. Patient is going to do a burst of the meloxicam as well as home exercises. X-rays ordered today. Follow-up again in 4 to 5 weeks.   Patient previously 2 years ago did respond somewhat to nitroglycerin.

## 2020-03-27 NOTE — Assessment & Plan Note (Signed)
Known degenerative disc disease.  We will get new x-rays.  Could be more cervical radiculopathy given some of the trouble with the shoulder pain as well.  Encourage patient to take meloxicam on a more regular basis for the next 10 days.  Discussed icing regimen and home exercises.  Worsening symptoms can always consider the possibility of MRI but I think patient will do well with conservative therapy.  Follow-up again in 4 to 5 weeks

## 2020-03-28 DIAGNOSIS — Z131 Encounter for screening for diabetes mellitus: Secondary | ICD-10-CM | POA: Diagnosis not present

## 2020-03-28 DIAGNOSIS — Z13220 Encounter for screening for lipoid disorders: Secondary | ICD-10-CM | POA: Diagnosis not present

## 2020-03-28 DIAGNOSIS — Z113 Encounter for screening for infections with a predominantly sexual mode of transmission: Secondary | ICD-10-CM | POA: Diagnosis not present

## 2020-03-28 DIAGNOSIS — Z23 Encounter for immunization: Secondary | ICD-10-CM | POA: Diagnosis not present

## 2020-03-28 DIAGNOSIS — Z0000 Encounter for general adult medical examination without abnormal findings: Secondary | ICD-10-CM | POA: Diagnosis not present

## 2020-03-28 DIAGNOSIS — Z6825 Body mass index (BMI) 25.0-25.9, adult: Secondary | ICD-10-CM | POA: Diagnosis not present

## 2020-03-28 DIAGNOSIS — E079 Disorder of thyroid, unspecified: Secondary | ICD-10-CM | POA: Diagnosis not present

## 2020-04-17 DIAGNOSIS — H52223 Regular astigmatism, bilateral: Secondary | ICD-10-CM | POA: Diagnosis not present

## 2020-05-02 NOTE — Progress Notes (Signed)
[place] Phone: [phone] Subjective:   Jenna Jenna [name], [name], including screening questions prior to the visit, [name], and extensive cleaning of exam room while observing appropriate contact time as indicated for disinfecting solutions.   I'm seeing this patient by the request  of:  Patient, Jenna Jenna [name]  CC: neck pain   [id number]  Jenna Jenna [name] is a 55 y.o. female coming in with complaint of back and neck pain. OMT 03/27/2020. Also f/u for L shoulder pain. Patient states that she has constant, throbbing pain in left scapula. Did do 10 days of meloxicam which did not help shoulder pain.   Medications patient has been prescribed: Meloxicam, Vit D           Reviewed prior external information including notes and imaging from previsou exam, outside providers and external EMR if available.   As well as notes that were available from care everywhere and other healthcare systems.  Past medical history, social, surgical and family history all reviewed in electronic medical record.  Jenna [name] unless stated regarding to the chief complaint.   Past Medical History:  Diagnosis Date  . Anemia   . Wears contact lenses     Allergies  Allergen Reactions  . Sulfa Antibiotics Diarrhea and Nausea And Vomiting     Review of Systems:  Jenna [name] headache, visual changes, nausea, vomiting, diarrhea, constipation, dizziness, abdominal pain, skin rash, fevers, chills, night sweats, weight loss, swollen lymph nodes, body aches, joint swelling, chest pain, shortness of breath, mood changes. POSITIVE muscle aches  Objective  Blood pressure 118/82, pulse 84, height 5\' 8"  (1.727 m), weight 170 lb (77.1 kg), SpO2 99 %.   General: Jenna [name] apparent distress  alert and oriented x3 mood and affect normal, dressed appropriately.  HEENT: Pupils equal, extraocular movements intact  Respiratory: Patient's speak in full sentences and does not appear short of breath  Cardiovascular: Jenna [name] lower extremity edema, non tender, Jenna [name] erythema    Gait normal with good balance and coordination.  MSK: Left shoulder exam still shows some mild positive impingement but otherwise about a 5 strength of the rotator cuff. Neck exam does have some tightness noted.  Patient still has tightness on more of the left side.  Patient has negative Spurling's though noted.  He does have some limited range of motion though in all planes.  Tightness noted on the left side of the parascapular region with multiple trigger nodules noted in the trapezius and rhomboid.  Osteopathic findings  C6 flexed rotated and side bent left T4 extended rotated and side bent left inhaled rib L2 flexed rotated and side bent right Sacrum right on right       Assessment and Plan:  DDD (degenerative disc disease), cervical Known degenerative disc disease.  Patient has had difficulty with the left rotator cuff and the labral previously as well.  Discussed with patient about different treatment options but patient has elected to do the conservative therapy at this time.  May consider formal physical therapy or even injections in the shoulder or trigger points of the middle scapula.  Patient did have improvement in strength of the shoulder noted today.  We will continue to monitor patient's neck with known degenerative disc disease.  Jenna [name] change in medications.  Follow-up again  in 6 to 8 weeks  Left rotator cuff tear Patient has made some improvement Jenna [name] significant weakness at this point.  Patient did do relatively well with the meloxicam.  X-rays do show likely tendinosis with some calcific changes of the rotator cuff.  Can consider the possibility of nitroglycerin if needed as well as consider the possibility  of injection.  Patient was making progress.    Nonallopathic problems  Decision today to treat with OMT was based on Physical Exam  After verbal consent patient was treated with HVLA, ME, FPR techniques in cervical, rib, thoracic, lumbar, and sacral  areas  Patient tolerated the procedure well with improvement in symptoms  Patient given exercises, stretches and lifestyle modifications  See medications in patient instructions if given  Patient will follow up in 4-8 weeks      The above documentation has been reviewed and is accurate and complete Jenna Pulley, DO       Note: This dictation was prepared with Dragon dictation along with smaller phrase technology. Any transcriptional errors that result from this process are unintentional.

## 2020-05-03 ENCOUNTER — Other Ambulatory Visit: Payer: Self-pay

## 2020-05-03 ENCOUNTER — Ambulatory Visit: Payer: 59 | Admitting: Family Medicine

## 2020-05-03 ENCOUNTER — Encounter: Payer: Self-pay | Admitting: Family Medicine

## 2020-05-03 VITALS — BP 118/82 | HR 84 | Ht 68.0 in | Wt 170.0 lb

## 2020-05-03 DIAGNOSIS — M999 Biomechanical lesion, unspecified: Secondary | ICD-10-CM | POA: Diagnosis not present

## 2020-05-03 DIAGNOSIS — M75112 Incomplete rotator cuff tear or rupture of left shoulder, not specified as traumatic: Secondary | ICD-10-CM | POA: Diagnosis not present

## 2020-05-03 DIAGNOSIS — M5030 Other cervical disc degeneration, unspecified cervical region: Secondary | ICD-10-CM

## 2020-05-03 NOTE — Assessment & Plan Note (Signed)
Known degenerative disc disease.  Patient has had difficulty with the left rotator cuff and the labral previously as well.  Discussed with patient about different treatment options but patient has elected to do the conservative therapy at this time.  May consider formal physical therapy or even injections in the shoulder or trigger points of the middle scapula.  Patient did have improvement in strength of the shoulder noted today.  We will continue to monitor patient's neck with known degenerative disc disease.  No change in medications.  Follow-up again in 6 to 8 weeks

## 2020-05-03 NOTE — Patient Instructions (Signed)
Good to see you Coop pillow [email or web address] Yoga wheel Keep working on posture and exercises  See me again in 6 weeks

## 2020-05-03 NOTE — Assessment & Plan Note (Signed)
Patient has made some improvement no significant weakness at this point.  Patient did do relatively well with the meloxicam.  X-rays do show likely tendinosis with some calcific changes of the rotator cuff.  Can consider the possibility of nitroglycerin if needed as well as consider the possibility of injection.  Patient was making progress.

## 2020-05-10 DIAGNOSIS — H2512 Age-related nuclear cataract, left eye: Secondary | ICD-10-CM | POA: Diagnosis not present

## 2020-05-10 DIAGNOSIS — H40053 Ocular hypertension, bilateral: Secondary | ICD-10-CM | POA: Diagnosis not present

## 2020-05-14 DIAGNOSIS — Z01818 Encounter for other preprocedural examination: Secondary | ICD-10-CM | POA: Diagnosis not present

## 2020-05-14 DIAGNOSIS — H2512 Age-related nuclear cataract, left eye: Secondary | ICD-10-CM | POA: Diagnosis not present

## 2020-05-15 DIAGNOSIS — R899 Unspecified abnormal finding in specimens from other organs, systems and tissues: Secondary | ICD-10-CM | POA: Diagnosis not present

## 2020-05-22 ENCOUNTER — Other Ambulatory Visit: Payer: Self-pay | Admitting: Family Medicine

## 2020-06-03 DIAGNOSIS — H2512 Age-related nuclear cataract, left eye: Secondary | ICD-10-CM | POA: Diagnosis not present

## 2020-06-03 DIAGNOSIS — H40052 Ocular hypertension, left eye: Secondary | ICD-10-CM | POA: Diagnosis not present

## 2020-06-03 DIAGNOSIS — H40053 Ocular hypertension, bilateral: Secondary | ICD-10-CM | POA: Diagnosis not present

## 2020-06-17 DIAGNOSIS — H40051 Ocular hypertension, right eye: Secondary | ICD-10-CM | POA: Diagnosis not present

## 2020-06-17 DIAGNOSIS — H2511 Age-related nuclear cataract, right eye: Secondary | ICD-10-CM | POA: Diagnosis not present

## 2020-06-18 ENCOUNTER — Ambulatory Visit: Payer: 59 | Admitting: Family Medicine

## 2020-07-07 ENCOUNTER — Encounter: Payer: Self-pay | Admitting: Family Medicine

## 2020-07-08 ENCOUNTER — Encounter: Payer: Self-pay | Admitting: Family Medicine

## 2020-07-08 ENCOUNTER — Ambulatory Visit: Payer: 59 | Admitting: Family Medicine

## 2020-07-08 ENCOUNTER — Other Ambulatory Visit: Payer: Self-pay

## 2020-07-08 VITALS — BP 130/86 | HR 98 | Ht 68.0 in | Wt 175.0 lb

## 2020-07-08 DIAGNOSIS — M9901 Segmental and somatic dysfunction of cervical region: Secondary | ICD-10-CM | POA: Diagnosis not present

## 2020-07-08 DIAGNOSIS — M9904 Segmental and somatic dysfunction of sacral region: Secondary | ICD-10-CM | POA: Diagnosis not present

## 2020-07-08 DIAGNOSIS — M9903 Segmental and somatic dysfunction of lumbar region: Secondary | ICD-10-CM | POA: Diagnosis not present

## 2020-07-08 DIAGNOSIS — M9908 Segmental and somatic dysfunction of rib cage: Secondary | ICD-10-CM

## 2020-07-08 DIAGNOSIS — M5030 Other cervical disc degeneration, unspecified cervical region: Secondary | ICD-10-CM

## 2020-07-08 DIAGNOSIS — M9902 Segmental and somatic dysfunction of thoracic region: Secondary | ICD-10-CM

## 2020-07-08 NOTE — Patient Instructions (Signed)
Good to see you Get back to COOP pillow [place] See me again in 6-8 weeks

## 2020-07-08 NOTE — Progress Notes (Signed)
[place] Phone: [phone] Subjective:   [name], am serving as a scribe for Dr. Hulan Saas.This visit occurred during the SARS-CoV-2 public health emergency.  Safety protocols were in place, including screening questions prior to the visit, [name], and extensive cleaning of exam room while observing appropriate contact time as indicated for disinfecting solutions.   I'm seeing this patient by the request  of:  Patient, No Pcp Per (Inactive)  CC: Neck pain follow-up  [id number]  MIYAKO OELKE is a 55 y.o. female coming in with complaint of back and neck pain. OMT 05/03/2020. Patient states that she bought coop pillow and could tell differnece with traveling and using hotel.  Feels like she is having worsening pain again.  No new symptoms just worsening of the neck pain again.  No significant radiation of the pain.  Mild tightness of the low back but most of it seems to be the left side of the neck.  Medications patient has been prescribed: Vit D, Meloxciam  Taking: Intermittently         Reviewed prior external information including notes and imaging from previsou exam, outside providers and external EMR if available.   As well as notes that were available from care everywhere and other healthcare systems.  Past medical history, social, surgical and family history all reviewed in electronic medical record.  No pertanent information unless stated regarding to the chief complaint.   Past Medical History:  Diagnosis Date  . Anemia   . Wears contact lenses     Allergies  Allergen Reactions  . Sulfa Antibiotics Diarrhea and Nausea And Vomiting     Review of Systems:  No  visual changes, nausea, vomiting, diarrhea, constipation, dizziness, abdominal pain, skin rash, fevers, chills, night sweats, weight loss, swollen lymph nodes, body aches, joint swelling, chest pain,  shortness of breath, mood changes. POSITIVE muscle aches, headache  Objective  Blood pressure 130/86, pulse 98, height 5\' 8"  (1.727 m), weight 175 lb (79.4 kg), SpO2 99 %.   General: No apparent distress alert and oriented x3 mood and affect normal, dressed appropriately.  HEENT: Pupils equal, extraocular movements intact  Respiratory: Patient's speak in full sentences and does not appear short of breath  Cardiovascular: No lower extremity edema, non tender, no erythema  Gait normal with good balance and coordination.  MSK:  Non tender with full range of motion and good stability and symmetric strength and tone of shoulders, elbows, wrist, hip, knee and ankles bilaterally.  Neck exam does have some mild loss of lordosis, tightness noted on the paraspinal musculature of the left side of the neck.  Negative Spurling's.  Tender to palpation mild in the trapezius as well.  Lacks last 5 degrees of sidebending and rotation to the left lacks last 10 degrees of extension of the neck  Osteopathic findings  C6 flexed rotated and side bent left T3 extended rotated and side bent left inhaled rib L2 flexed rotated and side bent right Sacrum right on right       Assessment and Plan:  DDD (degenerative disc disease), cervical Chronic problem with mild exacerbation noted.  Known degenerative disc disease.  Patient had responded well to a new pillow but is having worsening pain.  Encouraged her to take meloxicam on a more regular basis again.  We discussed posture and ergonomics.  Patient will be back in her home environment now for some  time and hopefully this will be more beneficial.  Follow-up with me again 6 to 8 weeks.   Nonallopathic problems  Decision today to treat with OMT was based on Physical Exam  After verbal consent patient was treated with HVLA, ME, FPR techniques in cervical, rib, thoracic, lumbar, and sacral  areas  Patient tolerated the procedure well with improvement in  symptoms  Patient given exercises, stretches and lifestyle modifications  See medications in patient instructions if given  Patient will follow up in 4-8 weeks      The above documentation has been reviewed and is accurate and complete [name], DO       Note: This dictation was prepared with Dragon dictation along with smaller phrase technology. Any transcriptional errors that result from this process are unintentional.

## 2020-07-08 NOTE — Assessment & Plan Note (Signed)
Chronic problem with mild exacerbation noted.  Known degenerative disc disease.  Patient had responded well to a new pillow but is having worsening pain.  Encouraged her to take meloxicam on a more regular basis again.  We discussed posture and ergonomics.  Patient will be back in her home environment now for some time and hopefully this will be more beneficial.  Follow-up with me again 6 to 8 weeks.

## 2020-07-17 ENCOUNTER — Ambulatory Visit: Payer: 59

## 2020-07-17 NOTE — Addendum Note (Signed)
Addended by: Ophelia Charter on: 07/17/2020 11:20 AM   Modules accepted: Orders

## 2020-07-18 ENCOUNTER — Encounter: Payer: Self-pay | Admitting: Family Medicine

## 2020-07-19 ENCOUNTER — Other Ambulatory Visit: Payer: Self-pay

## 2020-07-19 DIAGNOSIS — U071 COVID-19: Secondary | ICD-10-CM | POA: Diagnosis not present

## 2020-07-19 MED ORDER — PAXLOVID 20 X 150 MG & 10 X 100MG PO TBPK
ORAL_TABLET | ORAL | 0 refills | Status: DC
  Filled 2020-07-19: qty 30, 5d supply, fill #0

## 2020-07-19 MED ORDER — BENZONATATE 100 MG PO CAPS
ORAL_CAPSULE | ORAL | 0 refills | Status: DC
  Filled 2020-07-19: qty 30, 7d supply, fill #0

## 2020-07-24 ENCOUNTER — Ambulatory Visit: Payer: 59 | Attending: Internal Medicine

## 2020-07-24 DIAGNOSIS — Z20822 Contact with and (suspected) exposure to covid-19: Secondary | ICD-10-CM

## 2020-07-25 ENCOUNTER — Other Ambulatory Visit: Payer: 59

## 2020-07-25 LAB — NOVEL CORONAVIRUS, NAA: SARS-CoV-2, NAA: NOT DETECTED

## 2020-07-25 LAB — SARS-COV-2, NAA 2 DAY TAT

## 2020-07-30 ENCOUNTER — Other Ambulatory Visit: Payer: Self-pay | Admitting: Family Medicine

## 2020-07-30 ENCOUNTER — Other Ambulatory Visit: Payer: Self-pay

## 2020-07-30 MED ORDER — VITAMIN D (ERGOCALCIFEROL) 1.25 MG (50000 UNIT) PO CAPS
ORAL_CAPSULE | ORAL | 0 refills | Status: DC
  Filled 2020-07-30 – 2020-08-19 (×2): qty 12, 84d supply, fill #0

## 2020-08-19 ENCOUNTER — Other Ambulatory Visit: Payer: Self-pay

## 2020-08-20 ENCOUNTER — Other Ambulatory Visit: Payer: Self-pay | Admitting: Family Medicine

## 2020-08-21 ENCOUNTER — Other Ambulatory Visit: Payer: Self-pay

## 2020-08-21 MED ORDER — MELOXICAM 15 MG PO TABS
ORAL_TABLET | Freq: Every day | ORAL | 0 refills | Status: DC | PRN
  Filled 2020-08-21: qty 30, 30d supply, fill #0

## 2020-08-22 NOTE — Progress Notes (Signed)
[place] [address] [place] Phone: [phone] Subjective:   I Jenna [name] am serving as a Education administrator for Dr. Hulan Saas.  This visit occurred during the SARS-CoV-2 public health emergency.  Safety protocols were in place, including screening questions prior to the visit, [name], and extensive cleaning of exam room while observing appropriate contact time as indicated for disinfecting solutions.   I'm seeing this patient by the request  of:  Patient, No Pcp Per (Inactive)  CC: Neck pain follow-up  [id number]  Jenna [name] is a 55 y.o. female coming in with complaint of back and neck pain. OMT 07/08/2020. Patient states she is doing well. Neck is a little tighter. Using meloxicam.  Patient is having some mild discomfort and pain overall.  Notices tightness.  Does seem to be associated with stress and patient is working more.  Subsequently into the computer more seems to make it more aggravated.  Medications patient has been prescribed: Meloxicam, Vit D         Reviewed prior external information including notes and imaging from previsou exam, outside providers and external EMR if available.   As well as notes that were available from care everywhere and other healthcare systems.  Past medical history, social, surgical and family history all reviewed in electronic medical record.  No pertanent information unless stated regarding to the chief complaint.   Past Medical History:  Diagnosis Date   Anemia    Wears contact lenses     Allergies  Allergen Reactions   Sulfa Antibiotics Diarrhea and Nausea And Vomiting     Review of Systems:  No headache, visual changes, nausea, vomiting, diarrhea, constipation, dizziness, abdominal pain, skin rash, fevers, chills, night sweats, weight loss, swollen lymph nodes, body aches, joint swelling, chest pain, shortness of breath, mood changes. POSITIVE muscle  aches  Objective  Blood pressure 110/80, pulse 78, height 5\' 8"  (1.727 m), weight 168 lb (76.2 kg), SpO2 98 %.   General: No apparent distress alert and oriented x3 mood and affect normal, dressed appropriately.  HEENT: Pupils equal, extraocular movements intact  Respiratory: Patient's speak in full sentences and does not appear short of breath  Cardiovascular: No lower extremity edema, non tender, no erythema  Gait normal with good balance and coordination.  MSK:  Non tender with full range of motion and good stability and symmetric strength and tone of shoulders, elbows, wrist, hip, knee and ankles bilaterally.  Back -Tightness noted in the paraspinal musculature in the parascapular region.  Patient does have mild tightness of the lower back as well.  Neck exam still has significant limited range of motion in all planes.  Mild crepitus of the neck noted he lacks last 5 degrees of extension  Osteopathic findings  C7 flexed rotated and side bent left T3 extended rotated and side bent right inhaled rib T7 extended rotated and side bent left L2 flexed rotated and side bent right Sacrum right on right     Assessment and Plan:  DDD (degenerative disc disease), cervical Chronic, with mild exacerbation.  Mild increasing stress recently secondary to the job.  Discussed changing position with activity.  Patient was to avoid.  Increase activity slowly.  Discussed icing regimen and home exercises.  Follow-up again 6 to 8 weeks   Nonallopathic problems  Decision today to treat with OMT was based on Physical Exam  After verbal consent patient was treated with HVLA, ME, FPR techniques  in cervical, rib, thoracic, lumbar, and sacral  areas  Patient tolerated the procedure well with improvement in symptoms  Patient given exercises, stretches and lifestyle modifications  See medications in patient instructions if given  Patient will follow up in 4-8 weeks      The above documentation has  been reviewed and is accurate and complete [name], DO        Note: This dictation was prepared with Dragon dictation along with smaller phrase technology. Any transcriptional errors that result from this process are unintentional.

## 2020-08-26 ENCOUNTER — Ambulatory Visit: Payer: 59 | Admitting: Family Medicine

## 2020-08-26 ENCOUNTER — Encounter: Payer: Self-pay | Admitting: Family Medicine

## 2020-08-26 ENCOUNTER — Other Ambulatory Visit: Payer: Self-pay

## 2020-08-26 VITALS — BP 110/80 | HR 78 | Ht 68.0 in | Wt 168.0 lb

## 2020-08-26 DIAGNOSIS — M9902 Segmental and somatic dysfunction of thoracic region: Secondary | ICD-10-CM

## 2020-08-26 DIAGNOSIS — M9908 Segmental and somatic dysfunction of rib cage: Secondary | ICD-10-CM

## 2020-08-26 DIAGNOSIS — M5030 Other cervical disc degeneration, unspecified cervical region: Secondary | ICD-10-CM | POA: Diagnosis not present

## 2020-08-26 DIAGNOSIS — M9903 Segmental and somatic dysfunction of lumbar region: Secondary | ICD-10-CM

## 2020-08-26 DIAGNOSIS — M9904 Segmental and somatic dysfunction of sacral region: Secondary | ICD-10-CM | POA: Diagnosis not present

## 2020-08-26 DIAGNOSIS — M9901 Segmental and somatic dysfunction of cervical region: Secondary | ICD-10-CM

## 2020-08-26 NOTE — Assessment & Plan Note (Signed)
Chronic, with mild exacerbation.  Mild increasing stress recently secondary to the job.  Discussed changing position with activity.  Patient was to avoid.  Increase activity slowly.  Discussed icing regimen and home exercises.  Follow-up again 6 to 8 weeks

## 2020-08-26 NOTE — Patient Instructions (Addendum)
Good to see you Keep it up  Keep working on upper back strength See me again in 2 month

## 2020-10-02 ENCOUNTER — Other Ambulatory Visit: Payer: Self-pay

## 2020-10-02 MED ORDER — CHLORHEXIDINE GLUCONATE 0.12 % MT SOLN
OROMUCOSAL | 2 refills | Status: DC
  Filled 2020-10-02: qty 473, 15d supply, fill #0
  Filled 2021-09-03: qty 473, 15d supply, fill #1

## 2020-10-03 ENCOUNTER — Other Ambulatory Visit: Payer: Self-pay

## 2020-10-25 ENCOUNTER — Other Ambulatory Visit: Payer: Self-pay | Admitting: Family Medicine

## 2020-10-27 MED ORDER — MELOXICAM 15 MG PO TABS
ORAL_TABLET | Freq: Every day | ORAL | 0 refills | Status: DC | PRN
Start: 2020-10-27 — End: 2021-01-14
  Filled 2020-10-27: qty 30, 30d supply, fill #0

## 2020-10-28 ENCOUNTER — Other Ambulatory Visit: Payer: Self-pay

## 2020-10-30 ENCOUNTER — Ambulatory Visit: Payer: 59 | Admitting: Family Medicine

## 2020-10-30 NOTE — Progress Notes (Signed)
  [place] Phone: [phone] Subjective:   [name], [name], including screening questions prior to the visit, [name], and extensive cleaning of exam room while observing appropriate contact time as indicated for disinfecting solutions.   I'm seeing this patient by the request  of:  Patient, No Pcp Per (Inactive)  CC: Neck and back pain follow-up  [id number]  Jenna [name] is a 55 y.o. female coming in with complaint of back and neck pain. OMT on 08/26/2020. Patient states that she had to use meloxicam more the past 2 weeks due to stress from work. Tightness occurring in cervical spine. No radiating symptoms.   Medications patient has been prescribed: Meloxicam and Vit D        Past Medical History:  Diagnosis Date   Anemia    Wears contact lenses     Allergies  Allergen Reactions   Sulfa Antibiotics Diarrhea and Nausea And Vomiting     Review of Systems:  No headache, visual changes, nausea, vomiting, diarrhea, constipation, dizziness, abdominal pain, skin rash, fevers, chills, night sweats, weight loss, swollen lymph nodes, body aches, joint swelling, chest pain, shortness of breath, mood changes.   Objective  Blood pressure 112/78, pulse 76, height '5\' 8"'$  (1.727 m), weight 167 lb (75.8 kg), SpO2 99 %.   General: No apparent distress alert and oriented x3 mood and affect normal, dressed appropriately.  HEENT: Pupils equal, extraocular movements intact  Respiratory: Patient's speak in full sentences and does not appear short of breath  Cardiovascular: No lower extremity edema, non tender, no erythema  Patient neck exam does have some limited range of motion.  Patient does have tightness with sidebending bilaterally.  Low back exam  does have some tenderness to palpation in the paraspinal musculature.  Osteopathic findings  C2 flexed rotated and side bent right C6 flexed rotated and side bent left T7 extended rotated and side bent right inhaled rib L2 flexed rotated and side bent right Sacrum right on right       Assessment and Plan:  DDD (degenerative disc disease), cervical Known degenerative disc disease.  Discussed posture and ergonomics.  Discussed which activities to daily.  Avoid.  Increase activity slowly manipulation.  Discussed ergonomics at work where patient will try to make changes were possible.  Patient has been under a lot of stress that likely contributed to some of the discomfort and pain as well.  Follow-up with me again in 6 to 8 weeks   Nonallopathic problems  Decision today to treat with OMT was based on Physical Exam  After verbal consent patient was treated with HVLA, ME, FPR techniques in cervical, rib, thoracic, lumbar, and sacral  areas  Patient tolerated the procedure well with improvement in symptoms  Patient given exercises, stretches and lifestyle modifications  See medications in patient instructions if given  Patient will follow up in 4-8 weeks      The above documentation has been reviewed and is accurate and complete [name], DO        Note: This dictation was prepared with Dragon dictation along with smaller phrase technology. Any transcriptional errors that result from this process are unintentional.

## 2020-10-31 ENCOUNTER — Other Ambulatory Visit: Payer: Self-pay

## 2020-10-31 ENCOUNTER — Ambulatory Visit: Payer: 59 | Admitting: Family Medicine

## 2020-10-31 ENCOUNTER — Encounter: Payer: Self-pay | Admitting: Family Medicine

## 2020-10-31 VITALS — BP 112/78 | HR 76 | Ht 68.0 in | Wt 167.0 lb

## 2020-10-31 DIAGNOSIS — M9904 Segmental and somatic dysfunction of sacral region: Secondary | ICD-10-CM

## 2020-10-31 DIAGNOSIS — M9902 Segmental and somatic dysfunction of thoracic region: Secondary | ICD-10-CM

## 2020-10-31 DIAGNOSIS — M9901 Segmental and somatic dysfunction of cervical region: Secondary | ICD-10-CM

## 2020-10-31 DIAGNOSIS — M5030 Other cervical disc degeneration, unspecified cervical region: Secondary | ICD-10-CM

## 2020-10-31 DIAGNOSIS — M9903 Segmental and somatic dysfunction of lumbar region: Secondary | ICD-10-CM

## 2020-10-31 DIAGNOSIS — M9908 Segmental and somatic dysfunction of rib cage: Secondary | ICD-10-CM | POA: Diagnosis not present

## 2020-10-31 NOTE — Assessment & Plan Note (Signed)
Known degenerative disc disease.  Discussed posture and ergonomics.  Discussed which activities to daily.  Avoid.  Increase activity slowly manipulation.  Discussed ergonomics at work where patient will try to make changes were possible.  Patient has been under a lot of stress that likely contributed to some of the discomfort and pain as well.  Follow-up with me again in 6 to 8 weeks

## 2020-10-31 NOTE — Patient Instructions (Signed)
Enjoy the beach See me again in 6-8 weeks

## 2020-11-14 ENCOUNTER — Other Ambulatory Visit: Payer: Self-pay | Admitting: Family Medicine

## 2020-11-15 ENCOUNTER — Other Ambulatory Visit: Payer: Self-pay | Admitting: Family Medicine

## 2020-11-15 ENCOUNTER — Other Ambulatory Visit: Payer: Self-pay

## 2020-11-15 MED FILL — Ergocalciferol Cap 1.25 MG (50000 Unit): ORAL | 90 days supply | Qty: 12 | Fill #0 | Status: CN

## 2020-11-18 ENCOUNTER — Other Ambulatory Visit: Payer: Self-pay

## 2020-11-21 ENCOUNTER — Ambulatory Visit: Payer: 59 | Admitting: Family Medicine

## 2020-11-26 ENCOUNTER — Other Ambulatory Visit: Payer: Self-pay

## 2020-12-04 ENCOUNTER — Other Ambulatory Visit: Payer: Self-pay

## 2020-12-04 MED FILL — Ergocalciferol Cap 1.25 MG (50000 Unit): ORAL | 84 days supply | Qty: 12 | Fill #0 | Status: AC

## 2020-12-16 ENCOUNTER — Other Ambulatory Visit: Payer: Self-pay

## 2020-12-16 DIAGNOSIS — H209 Unspecified iridocyclitis: Secondary | ICD-10-CM | POA: Diagnosis not present

## 2020-12-16 MED ORDER — PREDNISOLONE ACETATE 1 % OP SUSP
OPHTHALMIC | 0 refills | Status: DC
  Filled 2020-12-16: qty 10, 30d supply, fill #0

## 2020-12-18 NOTE — Progress Notes (Deleted)
  [place] Phone: [phone] Subjective:    I'm seeing this patient by the request  of:  Patient, No Pcp Per (Inactive)  CC:   [id number]  Jenna [name] is a 55 y.o. female coming in with complaint of back and neck pain. OMT 10/31/2020.  Patient states   Medications patient has been prescribed: Vit D, Meloxicam  Taking:         Reviewed prior external information including notes and imaging from previsou exam, outside providers and external EMR if available.   As well as notes that were available from care everywhere and other healthcare systems.  Past medical history, social, surgical and family history all reviewed in electronic medical record.  No pertanent information unless stated regarding to the chief complaint.   Past Medical History:  Diagnosis Date   Anemia    Wears contact lenses     Allergies  Allergen Reactions   Sulfa Antibiotics Diarrhea and Nausea And Vomiting     Review of Systems:  No headache, visual changes, nausea, vomiting, diarrhea, constipation, dizziness, abdominal pain, skin rash, fevers, chills, night sweats, weight loss, swollen lymph nodes, body aches, joint swelling, chest pain, shortness of breath, mood changes. POSITIVE muscle aches  Objective  There were no vitals taken for this visit.   General: No apparent distress alert and oriented x3 mood and affect normal, dressed appropriately.  HEENT: Pupils equal, extraocular movements intact  Respiratory: Patient's speak in full sentences and does not appear short of breath  Cardiovascular: No lower extremity edema, non tender, no erythema  Neuro: Cranial nerves II through XII are intact, neurovascularly intact in all extremities with 2+ DTRs and 2+ pulses.  Gait normal with good balance and coordination.  MSK:  Non tender with full range of motion and good stability and symmetric strength and tone of shoulders,  elbows, wrist, hip, knee and ankles bilaterally.  Back - Normal skin, Spine with normal alignment and no deformity.  No tenderness to vertebral process palpation.  Paraspinous muscles are not tender and without spasm.   Range of motion is full at neck and lumbar sacral regions  Osteopathic findings  C2 flexed rotated and side bent right C6 flexed rotated and side bent left T3 extended rotated and side bent right inhaled rib T9 extended rotated and side bent left L2 flexed rotated and side bent right Sacrum right on right       Assessment and Plan:    Nonallopathic problems  Decision today to treat with OMT was based on Physical Exam  After verbal consent patient was treated with HVLA, ME, FPR techniques in cervical, rib, thoracic, lumbar, and sacral  areas  Patient tolerated the procedure well with improvement in symptoms  Patient given exercises, stretches and lifestyle modifications  See medications in patient instructions if given  Patient will follow up in 4-8 weeks      The above documentation has been reviewed and is accurate and complete [name]       Note: This dictation was prepared with Dragon dictation along with smaller phrase technology. Any transcriptional errors that result from this process are unintentional.

## 2020-12-20 ENCOUNTER — Ambulatory Visit: Payer: 59 | Admitting: Family Medicine

## 2020-12-24 ENCOUNTER — Other Ambulatory Visit: Payer: Self-pay

## 2020-12-24 MED ORDER — FLUTICASONE PROPIONATE 50 MCG/ACT NA SUSP
NASAL | 3 refills | Status: DC
  Filled 2020-12-24: qty 16, 30d supply, fill #0

## 2020-12-24 MED ORDER — FLUTICASONE PROPIONATE 50 MCG/ACT NA SUSP
NASAL | 11 refills | Status: DC
  Filled 2020-12-24: qty 16, 30d supply, fill #0

## 2020-12-25 DIAGNOSIS — Z1231 Encounter for screening mammogram for malignant neoplasm of breast: Secondary | ICD-10-CM | POA: Diagnosis not present

## 2020-12-25 DIAGNOSIS — Z006 Encounter for examination for normal comparison and control in clinical research program: Secondary | ICD-10-CM | POA: Diagnosis not present

## 2021-01-12 ENCOUNTER — Other Ambulatory Visit: Payer: Self-pay | Admitting: Family Medicine

## 2021-01-13 ENCOUNTER — Other Ambulatory Visit: Payer: Self-pay | Admitting: Family Medicine

## 2021-01-13 ENCOUNTER — Other Ambulatory Visit: Payer: Self-pay

## 2021-01-14 ENCOUNTER — Other Ambulatory Visit: Payer: Self-pay

## 2021-01-14 MED FILL — Meloxicam Tab 15 MG: ORAL | 30 days supply | Qty: 30 | Fill #0 | Status: AC

## 2021-01-27 ENCOUNTER — Encounter: Payer: Self-pay | Admitting: Family Medicine

## 2021-01-28 NOTE — Progress Notes (Signed)
[place] Phone: [phone] Subjective:    I'm seeing this patient by the request  of:  Patient, No Pcp Per (Inactive)  CC: Low back pain exacerbation  [id number]  Jenna [name] is a 55 y.o. female coming in with complaint of back and neck pain. OMT on 10/31/2020. Patient states low back has been hurting more than usual due to moving furniture. Other pains about the same.  Patient states that it is more of a tightness.  It seems to be worse in the mornings.  Patient states that it.  Does have a dull, throbbing aching pain.  Medications patient has been prescribed: Meloxicam  Taking: Yes         Reviewed prior external information including notes and imaging from previsou exam, outside providers and external EMR if available.   As well as notes that were available from care everywhere and other healthcare systems.  Past medical history, social, surgical and family history all reviewed in electronic medical record.  No pertanent information unless stated regarding to the chief complaint.   Past Medical History:  Diagnosis Date   Anemia    Wears contact lenses     Allergies  Allergen Reactions   Sulfa Antibiotics Diarrhea and Nausea And Vomiting     Review of Systems:  No headache, visual changes, nausea, vomiting, diarrhea, constipation, dizziness, abdominal pain, skin rash, fevers, chills, night sweats, weight loss, swollen lymph nodes, body aches, joint swelling, chest pain, shortness of breath, mood changes. POSITIVE muscle aches patient does deny any dysuria or hematuria  Objective  Blood pressure 112/68, pulse 79, weight 164 lb (74.4 kg), SpO2 98 %.   General: No apparent distress alert and oriented x3 mood and affect normal, dressed appropriately.  HEENT: Pupils equal, extraocular movements intact  Respiratory: Patient's speak in full sentences and does not appear short of breath   Patient is moving very carefully.  Patient does have significant tightness noted of the musculature on the right side with multiple trigger points in the paraspinal musculature of the lumbar spine.  Patient has a negative straight leg test.  Patient is tender to palpation over the sacroiliac joint as well.  Osteopathic findings   C5 flexed rotated and side bent left T3 extended rotated and side bent right inhaled rib T9 extended rotated and side bent left L2 flexed rotated and side bent right Sacrum right on right   After verbal consent patient was prepped with alcohol swab and with a 25-gauge half inch needle injected into 4 distinct trigger points in the lumbar region.  Total of 3 cc of 0.5% Marcaine and 1 cc of Kenalog 40 mg/mL used.  Minimal blood loss.  2 Band-Aids placed.  Postinjection instructions given    Assessment and Plan:  SI (sacroiliac) joint dysfunction Chronic problem with exacerbation.  Patient does have tightness noted of the hip flexor as well.  I did attempt some lumbar trigger point injections as well secondary to the tightness of the muscle.  Hopefully this will make an improvement.  Discussed icing regimen.  Can continue the meloxicam if needed.  Worsening pain with change to prednisone.  Follow-up with me again in 6 to 8 weeks  Lumbar trigger point syndrome CouldInjections given today and tolerated the procedure well, discussed icing regimen and home exercises.  Discussed which activities to do which wants to avoid.  Increase activity slowly.  Discussed which activities cause an exacerbation otherwise.  Hoping that this will make improvement otherwise advanced imaging may be warranted.   Nonallopathic problems  Decision today to treat with OMT was based on Physical Exam  After verbal consent patient was treated with HVLA, ME, FPR techniques in cervical, rib, thoracic, lumbar, and sacral  areas  Patient tolerated the procedure well with improvement in  symptoms  Patient given exercises, stretches and lifestyle modifications  See medications in patient instructions if given  Patient will follow up in 4-8 weeks    The above documentation has been reviewed and is accurate and complete [name], DO        Note: This dictation was prepared with Dragon dictation along with smaller phrase technology. Any transcriptional errors that result from this process are unintentional.

## 2021-01-29 ENCOUNTER — Other Ambulatory Visit: Payer: Self-pay

## 2021-01-29 ENCOUNTER — Ambulatory Visit: Payer: 59 | Admitting: Family Medicine

## 2021-01-29 VITALS — BP 112/68 | HR 79 | Wt 164.0 lb

## 2021-01-29 DIAGNOSIS — M533 Sacrococcygeal disorders, not elsewhere classified: Secondary | ICD-10-CM | POA: Diagnosis not present

## 2021-01-29 DIAGNOSIS — M9903 Segmental and somatic dysfunction of lumbar region: Secondary | ICD-10-CM | POA: Diagnosis not present

## 2021-01-29 DIAGNOSIS — M9902 Segmental and somatic dysfunction of thoracic region: Secondary | ICD-10-CM

## 2021-01-29 DIAGNOSIS — M9908 Segmental and somatic dysfunction of rib cage: Secondary | ICD-10-CM | POA: Diagnosis not present

## 2021-01-29 DIAGNOSIS — M9904 Segmental and somatic dysfunction of sacral region: Secondary | ICD-10-CM | POA: Diagnosis not present

## 2021-01-29 DIAGNOSIS — M5459 Other low back pain: Secondary | ICD-10-CM | POA: Diagnosis not present

## 2021-01-29 DIAGNOSIS — M9901 Segmental and somatic dysfunction of cervical region: Secondary | ICD-10-CM | POA: Diagnosis not present

## 2021-01-29 NOTE — Assessment & Plan Note (Signed)
CouldInjections given today and tolerated the procedure well, discussed icing regimen and home exercises.  Discussed which activities to do which wants to avoid.  Increase activity slowly.  Discussed which activities cause an exacerbation otherwise.  Hoping that this will make improvement otherwise advanced imaging may be warranted.

## 2021-01-29 NOTE — Assessment & Plan Note (Signed)
Chronic problem with exacerbation.  Patient does have tightness noted of the hip flexor as well.  I did attempt some lumbar trigger point injections as well secondary to the tightness of the muscle.  Hopefully this will make an improvement.  Discussed icing regimen.  Can continue the meloxicam if needed.  Worsening pain with change to prednisone.  Follow-up with me again in 6 to 8 weeks

## 2021-01-29 NOTE — Patient Instructions (Addendum)
Trigger point injections today Send me message in 2 days so I know you are doing better Muscle relaxer at night See me in 5-6 weeks If worsening pain seek medical attnetion

## 2021-01-31 ENCOUNTER — Encounter: Payer: Self-pay | Admitting: Family Medicine

## 2021-03-05 NOTE — Progress Notes (Signed)
[place] Phone: [phone] Subjective:   Jenna Jenna [name], am serving as a scribe for Dr. Hulan Saas.This visit occurred during the SARS-CoV-2 public health emergency.  Safety protocols were in place, including screening questions prior to the visit, [name], and extensive cleaning of exam room while observing appropriate contact time as indicated for disinfecting solutions.  I'm seeing this patient by the request  of:  Patient, Jenna [name] Pcp Per (Inactive)  CC: Neck and back pain follow-up  [id number]  Jenna Jenna [name] is a 56 y.o. female coming in with complaint of back and neck pain. OMT 01/29/2021. Patient states that she continues to have R sided lower back pain. Worse when she lays in bed to sleep. If she sleeps in recliner she has Jenna [name] pain. Purchased a mattress genie which raises her bed up like a recliner which has helped her pain. Ices for pain relief as well.   Medications patient has been prescribed: Meloxicam, Vit D  Taking:         Reviewed prior external information including notes and imaging from previsou exam, outside providers and external EMR if available.   As well as notes that were available from care everywhere and other healthcare systems.  Past medical history, social, surgical and family history all reviewed in electronic medical record.  Jenna [name] unless stated regarding to the chief complaint.   Past Medical History:  Diagnosis Date   Anemia    Wears contact lenses     Allergies  Allergen Reactions   Sulfa Antibiotics Diarrhea and Nausea And Vomiting     Review of Systems:  Jenna [name] headache, visual changes, nausea, vomiting, diarrhea, constipation, dizziness, abdominal pain, skin rash, fevers, chills, night sweats, weight loss, swollen lymph nodes, body aches, joint swelling, chest pain, shortness of breath, mood changes. POSITIVE muscle  aches  Objective  Blood pressure 104/78, pulse (!) 101, height 5\' 8"  (1.727 m), weight 167 lb (75.8 kg), SpO2 98 %.   General: Jenna [name] mood and affect normal, dressed appropriately.  HEENT: Pupils equal, extraocular movements intact  Respiratory: Patient's speak in full sentences and does not appear short of breath  Cardiovascular: Jenna [name] lower extremity edema, non tender, Jenna [name] erythema  Neck exam does have some loss of lordosis.  Some tenderness to palpation of the paraspinal musculature.  Patient does though have increasing discomfort and tightness noted of the paraspinal musculature of the lumbar worsening pain in the back with extension.  Severe tenderness over the right sacroiliac joint.  Negative straight leg test with positive FABER test.  Osteopathic findings  C2 flexed rotated and side bent right C6 flexed rotated and side bent left T3 extended rotated and side bent right inhaled rib L5 flexed rotated and side bent left Sacrum right on right severe discomfort in this area.       Assessment and Plan:  SI (sacroiliac) joint dysfunction Chronic, with exacerbation.  Increasing pain upon extension of the back.  Seems to be mostly right-sided.  He does have meloxicam as needed.  Discussed with patient about other different medications but patient wants to avoid them if possible.  We will get x-rays to further evaluate for any other bony abnormality that could be contributing.  Patient is really only having pain when laying down.  We discussed the different changes such as mattresses that could be beneficial.  Follow-up with me again in 6  to 8 weeks   Nonallopathic problems  Decision today to treat with OMT was based on Physical Exam  After verbal consent patient was treated with HVLA, ME, FPR techniques in cervical, rib, thoracic, lumbar, and sacral  areas  Patient tolerated the procedure well with improvement in symptoms  Patient given exercises,  stretches and lifestyle modifications  See medications in patient instructions if given  Patient will follow up in 4-8 weeks      The above documentation has been reviewed and is accurate and complete [name], DO        Note: This dictation was prepared with Dragon dictation along with smaller phrase technology. Any transcriptional errors that result from this process are unintentional.

## 2021-03-06 ENCOUNTER — Ambulatory Visit: Payer: 59 | Admitting: Family Medicine

## 2021-03-06 ENCOUNTER — Other Ambulatory Visit: Payer: Self-pay | Admitting: Family Medicine

## 2021-03-06 ENCOUNTER — Other Ambulatory Visit: Payer: Self-pay

## 2021-03-06 ENCOUNTER — Ambulatory Visit (INDEPENDENT_AMBULATORY_CARE_PROVIDER_SITE_OTHER): Payer: 59

## 2021-03-06 ENCOUNTER — Encounter: Payer: Self-pay | Admitting: Family Medicine

## 2021-03-06 VITALS — BP 104/78 | HR 101 | Ht 68.0 in | Wt 167.0 lb

## 2021-03-06 DIAGNOSIS — M9904 Segmental and somatic dysfunction of sacral region: Secondary | ICD-10-CM | POA: Diagnosis not present

## 2021-03-06 DIAGNOSIS — M545 Low back pain, unspecified: Secondary | ICD-10-CM

## 2021-03-06 DIAGNOSIS — M9908 Segmental and somatic dysfunction of rib cage: Secondary | ICD-10-CM

## 2021-03-06 DIAGNOSIS — M9903 Segmental and somatic dysfunction of lumbar region: Secondary | ICD-10-CM

## 2021-03-06 DIAGNOSIS — M533 Sacrococcygeal disorders, not elsewhere classified: Secondary | ICD-10-CM

## 2021-03-06 DIAGNOSIS — M9901 Segmental and somatic dysfunction of cervical region: Secondary | ICD-10-CM

## 2021-03-06 DIAGNOSIS — M9902 Segmental and somatic dysfunction of thoracic region: Secondary | ICD-10-CM | POA: Diagnosis not present

## 2021-03-06 DIAGNOSIS — M47816 Spondylosis without myelopathy or radiculopathy, lumbar region: Secondary | ICD-10-CM | POA: Diagnosis not present

## 2021-03-06 MED ORDER — VITAMIN D (ERGOCALCIFEROL) 1.25 MG (50000 UNIT) PO CAPS
ORAL_CAPSULE | ORAL | 0 refills | Status: DC
  Filled 2021-03-06: qty 12, 84d supply, fill #0

## 2021-03-06 NOTE — Assessment & Plan Note (Signed)
Chronic, with exacerbation.  Increasing pain upon extension of the back.  Seems to be mostly right-sided.  He does have meloxicam as needed.  Discussed with patient about other different medications but patient wants to avoid them if possible.  We will get x-rays to further evaluate for any other bony abnormality that could be contributing.  Patient is really only having pain when laying down.  We discussed the different changes such as mattresses that could be beneficial.  Follow-up with me again in 6 to 8 weeks

## 2021-03-06 NOTE — Patient Instructions (Addendum)
Xray today Rotate your mattress Remove gel pad See me again in 5-6 weeks

## 2021-04-04 DIAGNOSIS — Z130 Encounter for screening for diseases of the blood and blood-forming organs and certain disorders involving the immune mechanism: Secondary | ICD-10-CM | POA: Diagnosis not present

## 2021-04-04 DIAGNOSIS — Z13220 Encounter for screening for lipoid disorders: Secondary | ICD-10-CM | POA: Diagnosis not present

## 2021-04-04 DIAGNOSIS — Z131 Encounter for screening for diabetes mellitus: Secondary | ICD-10-CM | POA: Diagnosis not present

## 2021-04-04 DIAGNOSIS — Z1329 Encounter for screening for other suspected endocrine disorder: Secondary | ICD-10-CM | POA: Diagnosis not present

## 2021-04-04 DIAGNOSIS — Z0189 Encounter for other specified special examinations: Secondary | ICD-10-CM | POA: Diagnosis not present

## 2021-04-04 DIAGNOSIS — Z113 Encounter for screening for infections with a predominantly sexual mode of transmission: Secondary | ICD-10-CM | POA: Diagnosis not present

## 2021-04-08 DIAGNOSIS — Z113 Encounter for screening for infections with a predominantly sexual mode of transmission: Secondary | ICD-10-CM | POA: Diagnosis not present

## 2021-04-08 DIAGNOSIS — R7989 Other specified abnormal findings of blood chemistry: Secondary | ICD-10-CM | POA: Diagnosis not present

## 2021-04-08 DIAGNOSIS — Z6825 Body mass index (BMI) 25.0-25.9, adult: Secondary | ICD-10-CM | POA: Diagnosis not present

## 2021-04-08 DIAGNOSIS — Z124 Encounter for screening for malignant neoplasm of cervix: Secondary | ICD-10-CM | POA: Diagnosis not present

## 2021-04-08 DIAGNOSIS — Z1231 Encounter for screening mammogram for malignant neoplasm of breast: Secondary | ICD-10-CM | POA: Diagnosis not present

## 2021-04-08 DIAGNOSIS — Z0000 Encounter for general adult medical examination without abnormal findings: Secondary | ICD-10-CM | POA: Diagnosis not present

## 2021-04-08 NOTE — Progress Notes (Signed)
Jenna [name] [place] [address] [place] Phone: [phone] Subjective:   Jenna [name], [name], including screening questions prior to the visit, [name], and extensive cleaning of exam room while observing appropriate contact time as indicated for disinfecting solutions.   I'm seeing this patient by the request  of:  Patient, No Pcp Per (Inactive)  CC: back and neck pain   [id number]  Jenna [name] is a 56 y.o. female coming in with complaint of back and neck pain. OMT on 03/06/2021. Patient states same per usual. A little better. No new complaints.  More tightness but nothing that stops her from any activity at the moment.  Did have a fall on her left hip and is doing okay since then.  Medications patient has been prescribed: Vit D and Meloxicam  Taking:yes          Reviewed prior external information including notes and imaging from previsou exam, outside providers and external EMR if available.   As well as notes that were available from care everywhere and other healthcare systems.  Past medical history, social, surgical and family history all reviewed in electronic medical record.  No pertanent information unless stated regarding to the chief complaint.   Past Medical History:  Diagnosis Date   Anemia    Wears contact lenses     Allergies  Allergen Reactions   Sulfa Antibiotics Diarrhea and Nausea And Vomiting     Review of Systems:  No headache, visual changes, nausea, vomiting, diarrhea, constipation, dizziness, abdominal pain, skin rash, fevers, chills, night sweats, weight loss, swollen lymph nodes, body aches, joint swelling, chest pain, shortness of breath, mood changes. POSITIVE muscle aches  Objective  Blood pressure 118/76, pulse 88, height 5\' 8"   (1.727 m), weight 167 lb (75.8 kg), SpO2 96 %.   General: No apparent distress alert and oriented x3 mood and affect normal, dressed appropriately.  HEENT: Pupils equal, extraocular movements intact  Respiratory: Patient's speak in full sentences and does not appear short of breath  Cardiovascular: No lower extremity edema, non tender, no erythema  Neck exam shows limited sidebending bilaterally.  Negative Spurling's.  5 strength of the upper extremities. Back exam shows some mild bruising still noted from patient's previous trigger point injections.  Patient does have tightness in the parascapular region.  Neck exam does have some limited sidebending bilaterally as well.  Osteopathic findings  C2 flexed rotated and side bent right C6 flexed rotated and side bent left T3 extended rotated and side bent right inhaled rib T9 extended rotated and side bent left L2 flexed rotated and side bent right Sacrum right on right       Assessment and Plan:  SI (sacroiliac) joint dysfunction Chronic, mild neck exacerbation recently.  Does have the meloxicam as needed.  We discussed icing regimen and home exercises.  Patient will continue to stay active.  Responding well to manipulation.  Follow-up in 4 to 6 weeks  DDD (degenerative disc disease), cervical Did have some increasing tightness noted again today.  Discussed icing regimen and home exercises.  Discussed which activities to deal with.  Void.  Increase activity slowly.  Responding well to manipulation and follow-up again in 6 weeks    Nonallopathic problems  Decision today to treat with OMT was based on Physical Exam  After verbal consent patient was treated with HVLA, ME, FPR techniques in cervical, rib, thoracic, lumbar, and sacral  areas  Patient tolerated the procedure well with improvement in symptoms  Patient given exercises, stretches and lifestyle modifications  See medications in patient instructions if given  Patient  will follow up in 4-8 weeks      The above documentation has been reviewed and is accurate and complete Jenna Pulley, DO        Note: This dictation was prepared with Dragon dictation along with smaller phrase technology. Any transcriptional errors that result from this process are unintentional.

## 2021-04-09 ENCOUNTER — Encounter: Payer: Self-pay | Admitting: Family Medicine

## 2021-04-09 ENCOUNTER — Ambulatory Visit: Payer: 59 | Admitting: Family Medicine

## 2021-04-09 ENCOUNTER — Other Ambulatory Visit: Payer: Self-pay

## 2021-04-09 VITALS — BP 118/76 | HR 88 | Ht 68.0 in | Wt 167.0 lb

## 2021-04-09 DIAGNOSIS — M9901 Segmental and somatic dysfunction of cervical region: Secondary | ICD-10-CM | POA: Diagnosis not present

## 2021-04-09 DIAGNOSIS — M9903 Segmental and somatic dysfunction of lumbar region: Secondary | ICD-10-CM

## 2021-04-09 DIAGNOSIS — M9904 Segmental and somatic dysfunction of sacral region: Secondary | ICD-10-CM | POA: Diagnosis not present

## 2021-04-09 DIAGNOSIS — M9902 Segmental and somatic dysfunction of thoracic region: Secondary | ICD-10-CM | POA: Diagnosis not present

## 2021-04-09 DIAGNOSIS — M533 Sacrococcygeal disorders, not elsewhere classified: Secondary | ICD-10-CM

## 2021-04-09 DIAGNOSIS — M5030 Other cervical disc degeneration, unspecified cervical region: Secondary | ICD-10-CM | POA: Diagnosis not present

## 2021-04-09 DIAGNOSIS — M9908 Segmental and somatic dysfunction of rib cage: Secondary | ICD-10-CM | POA: Diagnosis not present

## 2021-04-09 NOTE — Assessment & Plan Note (Signed)
Did have some increasing tightness noted again today.  Discussed icing regimen and home exercises.  Discussed which activities to deal with.  Void.  Increase activity slowly.  Responding well to manipulation and follow-up again in 6 weeks

## 2021-04-09 NOTE — Assessment & Plan Note (Signed)
Chronic, mild neck exacerbation recently.  Does have the meloxicam as needed.  We discussed icing regimen and home exercises.  Patient will continue to stay active.  Responding well to manipulation.  Follow-up in 4 to 6 weeks

## 2021-04-09 NOTE — Patient Instructions (Signed)
Good to see you! Put a gate on your stairs See you again in 6-8 weeks

## 2021-04-23 DIAGNOSIS — H5202 Hypermetropia, left eye: Secondary | ICD-10-CM | POA: Diagnosis not present

## 2021-04-23 DIAGNOSIS — H524 Presbyopia: Secondary | ICD-10-CM | POA: Diagnosis not present

## 2021-05-12 NOTE — Progress Notes (Unsigned)
°  [place] Phone: [phone] Subjective:    I'm seeing this patient by the request  of:  Patient, No Pcp Per (Inactive)  CC:   [id number]  Jenna [name] is a 56 y.o. female coming in with complaint of back and neck pain. OMT 04/09/2021. Patient states   Medications patient has been prescribed: Vit D  Taking:         Reviewed prior external information including notes and imaging from previsou exam, outside providers and external EMR if available.   As well as notes that were available from care everywhere and other healthcare systems.  Past medical history, social, surgical and family history all reviewed in electronic medical record.  No pertanent information unless stated regarding to the chief complaint.   Past Medical History:  Diagnosis Date   Anemia    Wears contact lenses     Allergies  Allergen Reactions   Sulfa Antibiotics Diarrhea and Nausea And Vomiting     Review of Systems:  No headache, visual changes, nausea, vomiting, diarrhea, constipation, dizziness, abdominal pain, skin rash, fevers, chills, night sweats, weight loss, swollen lymph nodes, body aches, joint swelling, chest pain, shortness of breath, mood changes. POSITIVE muscle aches  Objective  There were no vitals taken for this visit.   General: No apparent distress alert and oriented x3 mood and affect normal, dressed appropriately.  HEENT: Pupils equal, extraocular movements intact  Respiratory: Patient's speak in full sentences and does not appear short of breath  Cardiovascular: No lower extremity edema, non tender, no erythema  Neuro: Cranial nerves II through XII are intact, neurovascularly intact in all extremities with 2+ DTRs and 2+ pulses.  Gait normal with good balance and coordination.  MSK:  Non tender with full range of motion and good stability and symmetric strength and tone of shoulders, elbows,  wrist, hip, knee and ankles bilaterally.  Back - Normal skin, Spine with normal alignment and no deformity.  No tenderness to vertebral process palpation.  Paraspinous muscles are not tender and without spasm.   Range of motion is full at neck and lumbar sacral regions  Osteopathic findings  C2 flexed rotated and side bent right C6 flexed rotated and side bent left T3 extended rotated and side bent right inhaled rib T9 extended rotated and side bent left L2 flexed rotated and side bent right Sacrum right on right       Assessment and Plan:    Nonallopathic problems  Decision today to treat with OMT was based on Physical Exam  After verbal consent patient was treated with HVLA, ME, FPR techniques in cervical, rib, thoracic, lumbar, and sacral  areas  Patient tolerated the procedure well with improvement in symptoms  Patient given exercises, stretches and lifestyle modifications  See medications in patient instructions if given  Patient will follow up in 4-8 weeks      The above documentation has been reviewed and is accurate and complete [name]       Note: This dictation was prepared with Dragon dictation along with smaller phrase technology. Any transcriptional errors that result from this process are unintentional.

## 2021-05-19 ENCOUNTER — Other Ambulatory Visit: Payer: Self-pay

## 2021-05-19 DIAGNOSIS — H16223 Keratoconjunctivitis sicca, not specified as Sjogren's, bilateral: Secondary | ICD-10-CM | POA: Diagnosis not present

## 2021-05-19 MED ORDER — XIIDRA 5 % OP SOLN
1.0000 [drp] | Freq: Two times a day (BID) | OPHTHALMIC | 3 refills | Status: DC
  Filled 2021-05-19: qty 180, 90d supply, fill #0
  Filled 2021-09-03: qty 180, 90d supply, fill #1
  Filled 2021-11-29: qty 180, 90d supply, fill #2
  Filled 2022-03-09: qty 180, 90d supply, fill #3

## 2021-05-20 ENCOUNTER — Other Ambulatory Visit: Payer: Self-pay

## 2021-05-20 ENCOUNTER — Ambulatory Visit: Payer: 59 | Admitting: Family Medicine

## 2021-05-20 VITALS — BP 102/72 | HR 85 | Ht 68.0 in | Wt 167.0 lb

## 2021-05-20 DIAGNOSIS — M9904 Segmental and somatic dysfunction of sacral region: Secondary | ICD-10-CM

## 2021-05-20 DIAGNOSIS — M9902 Segmental and somatic dysfunction of thoracic region: Secondary | ICD-10-CM

## 2021-05-20 DIAGNOSIS — M5030 Other cervical disc degeneration, unspecified cervical region: Secondary | ICD-10-CM | POA: Diagnosis not present

## 2021-05-20 DIAGNOSIS — M9908 Segmental and somatic dysfunction of rib cage: Secondary | ICD-10-CM

## 2021-05-20 DIAGNOSIS — M9901 Segmental and somatic dysfunction of cervical region: Secondary | ICD-10-CM | POA: Diagnosis not present

## 2021-05-20 DIAGNOSIS — M9903 Segmental and somatic dysfunction of lumbar region: Secondary | ICD-10-CM | POA: Diagnosis not present

## 2021-05-20 NOTE — Patient Instructions (Signed)
See me again in 5 weeks ?When tight consider watching shoes you wear ?3 IBU 3x a day for 3 days  ?

## 2021-05-20 NOTE — Assessment & Plan Note (Signed)
Chronic, with mild exacerbation.  This as well as patient did have some increase in his low back pain noted today as well.  We discussed different medications.  Does have the meloxicam.  Has not been using it regularly.  Discussed ibuprofen as well.  Discussed which activities to do which wants to avoid.  Increase activity slowly.  Follow-up again in 6 to 8 weeks. ?

## 2021-06-09 ENCOUNTER — Other Ambulatory Visit: Payer: Self-pay | Admitting: Family Medicine

## 2021-06-09 ENCOUNTER — Other Ambulatory Visit: Payer: Self-pay

## 2021-06-25 NOTE — Progress Notes (Signed)
?Charlann Boxer D.O. ?[facility] ?[place] ?Phone: [phone] ?Subjective:   ?I, Jenna [name], am serving as a Education administrator for Dr. Hulan Saas. ?This visit occurred during the SARS-CoV-2 public health emergency.  Safety protocols were in place, including screening questions prior to the visit, [name], and extensive cleaning of exam room while observing appropriate contact time as indicated for disinfecting solutions.  ? ?I'm seeing this patient by the request  of:  Patient, No Pcp Per (Inactive) ? ?CC: Back and neck pain follow-up ? ?[id number]  ?Jenna [name] is a 56 y.o. female coming in with complaint of back and neck pain. OMT 05/20/2021. Patient states same per usual. No new complaints.  Patient has been a little more tired, did travel recently.  Feels like this could have exacerbated some of her other discomfort especially in the back of the neck. ? ?Medications patient has been prescribed: Vit D ? ?Taking: ? ? ?  ? ? ? ? ?Reviewed prior external information including notes and imaging from previsou exam, outside providers and external EMR if available.  ? ?As well as notes that were available from care everywhere and other healthcare systems. ? ?Past medical history, social, surgical and family history all reviewed in electronic medical record.  No pertanent information unless stated regarding to the chief complaint.  ? ?Past Medical History:  ?Diagnosis Date  ? Anemia   ? Wears contact lenses   ?  ?Allergies  ?Allergen Reactions  ? Sulfa Antibiotics Diarrhea and Nausea And Vomiting  ? ? ? ?Review of Systems: ? No headache, visual changes, nausea, vomiting, diarrhea, constipation, dizziness, abdominal pain, skin rash, fevers, chills, night sweats, weight loss, swollen lymph nodes, body aches, joint swelling, chest pain, shortness of breath, mood changes. POSITIVE muscle aches ? ?Objective  ?Blood pressure 116/72, pulse 92, height '5\' 8"'$  (1.727  m), weight 169 lb (76.7 kg), SpO2 99 %. ?  ?General: No apparent distress alert and oriented x3 mood and affect normal, dressed appropriately.  ?HEENT: Pupils equal, extraocular movements intact  ?Respiratory: Patient's speak in full sentences and does not appear short of breath  ?Cardiovascular: No lower extremity edema, non tender, no erythema  ?Neck exam is a little tighter than usual.  Patient does have loss of lordosis.  Tenderness to palpation in the paraspinal musculature.  Mild crepitus in the neck noted. ?Patient also has tightness in the low back seems to be right greater than left.  Seems to be more in the thoracolumbar juncture in the tightness of the hip flexor right greater than left. ? ?Osteopathic findings ? ?C2 flexed rotated and side bent right ?C6 flexed rotated and side bent left ?T3 extended rotated and side bent right inhaled rib ?T9 extended rotated and side bent left ?L2 flexed rotated and side bent right ?Sacrum right on right ? ? ? ? ?  ?Assessment and Plan: ? ?SI (sacroiliac) joint dysfunction ?Chronic, with mild exacerbation, does have the degenerative disc disease of the cervical spine as well.  Discussed icing regimen and home exercises, which activities to do which ones to avoid, increase activity slowly.  Follow-up again in 6 to 8 weeks.  ? ?Nonallopathic problems ? ?Decision today to treat with OMT was based on Physical Exam ? ?After verbal consent patient was treated with HVLA, ME, FPR techniques in cervical, rib, thoracic, lumbar, and sacral  areas ? ?Patient tolerated the procedure well with improvement in symptoms ? ?Patient given exercises,  stretches and lifestyle modifications ? ?See medications in patient instructions if given ? ?Patient will follow up in 4-8 weeks ? ?  ? ? ?The above documentation has been reviewed and is accurate and complete [name], DO ? ? ? ?  ? ? Note: This dictation was prepared with Dragon dictation along with smaller phrase technology. Any  transcriptional errors that result from this process are unintentional.    ?  ?  ? ?

## 2021-06-26 ENCOUNTER — Ambulatory Visit: Payer: 59 | Admitting: Family Medicine

## 2021-06-26 ENCOUNTER — Other Ambulatory Visit: Payer: Self-pay

## 2021-06-26 VITALS — BP 116/72 | HR 92 | Ht 68.0 in | Wt 169.0 lb

## 2021-06-26 DIAGNOSIS — M9904 Segmental and somatic dysfunction of sacral region: Secondary | ICD-10-CM | POA: Diagnosis not present

## 2021-06-26 DIAGNOSIS — M9908 Segmental and somatic dysfunction of rib cage: Secondary | ICD-10-CM | POA: Diagnosis not present

## 2021-06-26 DIAGNOSIS — M9901 Segmental and somatic dysfunction of cervical region: Secondary | ICD-10-CM

## 2021-06-26 DIAGNOSIS — M9903 Segmental and somatic dysfunction of lumbar region: Secondary | ICD-10-CM | POA: Diagnosis not present

## 2021-06-26 DIAGNOSIS — M533 Sacrococcygeal disorders, not elsewhere classified: Secondary | ICD-10-CM

## 2021-06-26 DIAGNOSIS — M9902 Segmental and somatic dysfunction of thoracic region: Secondary | ICD-10-CM | POA: Diagnosis not present

## 2021-06-26 MED ORDER — VITAMIN D (ERGOCALCIFEROL) 1.25 MG (50000 UNIT) PO CAPS
ORAL_CAPSULE | ORAL | 0 refills | Status: DC
  Filled 2021-06-26: qty 12, 84d supply, fill #0

## 2021-06-26 MED ORDER — MELOXICAM 15 MG PO TABS
ORAL_TABLET | Freq: Every day | ORAL | 0 refills | Status: DC | PRN
  Filled 2021-06-26: qty 30, 30d supply, fill #0

## 2021-06-26 NOTE — Assessment & Plan Note (Signed)
Chronic, with mild exacerbation, does have the degenerative disc disease of the cervical spine as well.  Discussed icing regimen and home exercises, which activities to do which ones to avoid, increase activity slowly.  Follow-up again in 6 to 8 weeks. ?

## 2021-06-26 NOTE — Patient Instructions (Signed)
Good to see you! ?Meloxicam refill ?Try to get back in routine ?See you again in 4-6 weeks ?

## 2021-07-04 DIAGNOSIS — L910 Hypertrophic scar: Secondary | ICD-10-CM | POA: Diagnosis not present

## 2021-07-04 DIAGNOSIS — L708 Other acne: Secondary | ICD-10-CM | POA: Diagnosis not present

## 2021-07-22 NOTE — Progress Notes (Deleted)
  [place] Phone: [phone] Subjective:    I'm seeing this patient by the request  of:  Patient, No Pcp Per (Inactive)  CC:   [id number]  Jenna [name] is a 56 y.o. female coming in with complaint of back and neck pain. OMT 06/26/2021. Patient states   Medications patient has been prescribed: Meloxicam, Vit D  Taking:         Reviewed prior external information including notes and imaging from previsou exam, outside providers and external EMR if available.   As well as notes that were available from care everywhere and other healthcare systems.  Past medical history, social, surgical and family history all reviewed in electronic medical record.  No pertanent information unless stated regarding to the chief complaint.   Past Medical History:  Diagnosis Date   Anemia    Wears contact lenses     Allergies  Allergen Reactions   Sulfa Antibiotics Diarrhea and Nausea And Vomiting     Review of Systems:  No headache, visual changes, nausea, vomiting, diarrhea, constipation, dizziness, abdominal pain, skin rash, fevers, chills, night sweats, weight loss, swollen lymph nodes, body aches, joint swelling, chest pain, shortness of breath, mood changes. POSITIVE muscle aches  Objective  There were no vitals taken for this visit.   General: No apparent distress alert and oriented x3 mood and affect normal, dressed appropriately.  HEENT: Pupils equal, extraocular movements intact  Respiratory: Patient's speak in full sentences and does not appear short of breath  Cardiovascular: No lower extremity edema, non tender, no erythema  Neuro: Cranial nerves II through XII are intact, neurovascularly intact in all extremities with 2+ DTRs and 2+ pulses.  Gait normal with good balance and coordination.  MSK:  Non tender with full range of motion and good stability and symmetric strength and tone of shoulders,  elbows, wrist, hip, knee and ankles bilaterally.  Back - Normal skin, Spine with normal alignment and no deformity.  No tenderness to vertebral process palpation.  Paraspinous muscles are not tender and without spasm.   Range of motion is full at neck and lumbar sacral regions  Osteopathic findings  C2 flexed rotated and side bent right C6 flexed rotated and side bent left T3 extended rotated and side bent right inhaled rib T9 extended rotated and side bent left L2 flexed rotated and side bent right Sacrum right on right       Assessment and Plan:    Nonallopathic problems  Decision today to treat with OMT was based on Physical Exam  After verbal consent patient was treated with HVLA, ME, FPR techniques in cervical, rib, thoracic, lumbar, and sacral  areas  Patient tolerated the procedure well with improvement in symptoms  Patient given exercises, stretches and lifestyle modifications  See medications in patient instructions if given  Patient will follow up in 4-8 weeks      The above documentation has been reviewed and is accurate and complete Jenna [name]       Note: This dictation was prepared with Dragon dictation along with smaller phrase technology. Any transcriptional errors that result from this process are unintentional.

## 2021-07-24 ENCOUNTER — Ambulatory Visit: Payer: 59 | Admitting: Family Medicine

## 2021-08-12 NOTE — Progress Notes (Unsigned)
  [place] Phone: [phone] Subjective:   Jenna Jenna, am serving as a scribe for Dr. Hulan Saas.  I'm seeing this patient by the request  of:  Patient, Jenna Jenna  CC: Back and neck f/u   Jenna Jenna  Jenna Jenna is a 56 y.o. female coming in with complaint of back and neck pain. OMT 06/26/2021.Patient states that's that her R trap is tight and will not loosen up. Pain lumbar spine is intermittent. Is able to exercise with less.   Medications patient has been prescribed: Meloxicam, Vit D  Taking:         Reviewed prior external information including notes and imaging from previsou exam, outside providers and external EMR if available.   As well as notes that were available from care everywhere and other healthcare systems.  Past medical history, social, surgical and family history all reviewed in electronic medical record.  Jenna pertanent information unless stated regarding to the chief complaint.   Past Medical History:  Diagnosis Date   Anemia    Wears contact lenses     Allergies  Allergen Reactions   Sulfa Antibiotics Diarrhea and Nausea And Vomiting     Review of Systems:  Jenna headache, visual changes, nausea, vomiting, diarrhea, constipation, dizziness, abdominal pain, skin rash, fevers, chills, night sweats, weight loss, swollen lymph nodes, body aches, joint swelling, chest pain, shortness of breath, mood changes. POSITIVE muscle aches  Objective  Blood pressure 104/72, pulse 84, height '5\' 8"'$  (1.727 m), weight 168 lb (76.2 kg), SpO2 98 %.   General: Jenna apparent distress alert and oriented x3 mood and affect normal, dressed appropriately.  HEENT: Pupils equal, extraocular movements intact  Respiratory: Patient's speak in full sentences and does not appear short of breath  Cardiovascular: Jenna lower extremity edema, non tender, Jenna erythema  Gait MSK: Patient does have some  limited range of motion of the neck.  Patient lacks last 10 degrees of extension of the neck.  Significant tightness noted on the right parascapular region. Back   Osteopathic findings  C2 flexed rotated and side bent right C6 flexed rotated and side bent right T3 extended rotated and side bent right inhaled rib T9 extended rotated and side bent right L2 flexed rotated and side bent right Sacrum right on right       Assessment and Plan:  DDD (degenerative disc disease), cervical Chronic problem with what I think is more of an exacerbation.  Discussed icing regimen and home exercises, which activities to do and which ones to avoid this activity slowly otherwise.  In 6 to 8 weeks.  Discussed meloxicam 3 to 5-day burst for this chronic problem with exacerbation.    Nonallopathic problems  Decision today to treat with OMT was based on Physical Exam  After verbal consent patient was treated with HVLA, ME, FPR techniques in cervical, rib, thoracic, lumbar, and sacral  areas  Patient tolerated the procedure well with improvement in symptoms  Patient given exercises, stretches and lifestyle modifications  See medications in patient instructions if given  Patient will follow up in 4-8 weeks             Note: This dictation was prepared with Dragon dictation along with smaller phrase technology. Any transcriptional errors that result from this process are unintentional.

## 2021-08-13 ENCOUNTER — Encounter: Payer: Self-pay | Admitting: Family Medicine

## 2021-08-13 ENCOUNTER — Ambulatory Visit: Payer: 59 | Admitting: Family Medicine

## 2021-08-13 VITALS — BP 104/72 | HR 84 | Ht 68.0 in | Wt 168.0 lb

## 2021-08-13 DIAGNOSIS — M9903 Segmental and somatic dysfunction of lumbar region: Secondary | ICD-10-CM | POA: Diagnosis not present

## 2021-08-13 DIAGNOSIS — M2550 Pain in unspecified joint: Secondary | ICD-10-CM | POA: Diagnosis not present

## 2021-08-13 DIAGNOSIS — M9901 Segmental and somatic dysfunction of cervical region: Secondary | ICD-10-CM | POA: Diagnosis not present

## 2021-08-13 DIAGNOSIS — M9908 Segmental and somatic dysfunction of rib cage: Secondary | ICD-10-CM

## 2021-08-13 DIAGNOSIS — M9904 Segmental and somatic dysfunction of sacral region: Secondary | ICD-10-CM | POA: Diagnosis not present

## 2021-08-13 DIAGNOSIS — M5030 Other cervical disc degeneration, unspecified cervical region: Secondary | ICD-10-CM | POA: Diagnosis not present

## 2021-08-13 DIAGNOSIS — M9902 Segmental and somatic dysfunction of thoracic region: Secondary | ICD-10-CM | POA: Diagnosis not present

## 2021-08-13 LAB — CBC WITH DIFFERENTIAL/PLATELET
Basophils Absolute: 0 10*3/uL (ref 0.0–0.1)
Basophils Relative: 0.6 % (ref 0.0–3.0)
Eosinophils Absolute: 0.2 10*3/uL (ref 0.0–0.7)
Eosinophils Relative: 2.9 % (ref 0.0–5.0)
HCT: 39.2 % (ref 36.0–46.0)
Hemoglobin: 13 g/dL (ref 12.0–15.0)
Lymphocytes Relative: 36.4 % (ref 12.0–46.0)
Lymphs Abs: 2.2 10*3/uL (ref 0.7–4.0)
MCHC: 33.1 g/dL (ref 30.0–36.0)
MCV: 86.3 fl (ref 78.0–100.0)
Monocytes Absolute: 0.5 10*3/uL (ref 0.1–1.0)
Monocytes Relative: 7.9 % (ref 3.0–12.0)
Neutro Abs: 3.2 10*3/uL (ref 1.4–7.7)
Neutrophils Relative %: 52.2 % (ref 43.0–77.0)
Platelets: 190 10*3/uL (ref 150.0–400.0)
RBC: 4.54 Mil/uL (ref 3.87–5.11)
RDW: 13 % (ref 11.5–15.5)
WBC: 6.1 10*3/uL (ref 4.0–10.5)

## 2021-08-13 LAB — IBC PANEL
Iron: 86 ug/dL (ref 42–145)
Saturation Ratios: 26.7 % (ref 20.0–50.0)
TIBC: 322 ug/dL (ref 250.0–450.0)
Transferrin: 230 mg/dL (ref 212.0–360.0)

## 2021-08-13 LAB — COMPREHENSIVE METABOLIC PANEL
ALT: 11 U/L (ref 0–35)
AST: 17 U/L (ref 0–37)
Albumin: 4.3 g/dL (ref 3.5–5.2)
Alkaline Phosphatase: 106 U/L (ref 39–117)
BUN: 12 mg/dL (ref 6–23)
CO2: 30 mEq/L (ref 19–32)
Calcium: 9.4 mg/dL (ref 8.4–10.5)
Chloride: 105 mEq/L (ref 96–112)
Creatinine, Ser: 0.96 mg/dL (ref 0.40–1.20)
GFR: 67.54 mL/min (ref >60.00)
Glucose, Bld: 91 mg/dL (ref 70–99)
Potassium: 4.1 mEq/L (ref 3.5–5.1)
Sodium: 141 mEq/L (ref 135–145)
Total Bilirubin: 0.7 mg/dL (ref 0.2–1.2)
Total Protein: 7.5 g/dL (ref 6.0–8.3)

## 2021-08-13 LAB — SEDIMENTATION RATE: Sed Rate: 14 mm/hr (ref 0–30)

## 2021-08-13 LAB — VITAMIN D 25 HYDROXY (VIT D DEFICIENCY, FRACTURES): VITD: 84.51 ng/mL (ref 30.00–100.00)

## 2021-08-13 LAB — FERRITIN: Ferritin: 151.2 ng/mL (ref 10.0–291.0)

## 2021-08-13 NOTE — Patient Instructions (Signed)
Labs today See me in 5-6 weeks  

## 2021-08-13 NOTE — Assessment & Plan Note (Signed)
Chronic problem with what I think is more of an exacerbation.  Discussed icing regimen and home exercises, which activities to do and which ones to avoid this activity slowly otherwise.  In 6 to 8 weeks.  Discussed meloxicam 3 to 5-day burst for this chronic problem with exacerbation.

## 2021-08-15 ENCOUNTER — Other Ambulatory Visit: Payer: Self-pay

## 2021-08-15 DIAGNOSIS — L668 Other cicatricial alopecia: Secondary | ICD-10-CM | POA: Diagnosis not present

## 2021-08-15 DIAGNOSIS — L7211 Pilar cyst: Secondary | ICD-10-CM | POA: Diagnosis not present

## 2021-08-15 DIAGNOSIS — L700 Acne vulgaris: Secondary | ICD-10-CM | POA: Diagnosis not present

## 2021-08-15 MED ORDER — TRETINOIN 0.05 % EX CREA
TOPICAL_CREAM | CUTANEOUS | 1 refills | Status: DC
  Filled 2021-08-15 (×2): qty 20, 30d supply, fill #0
  Filled 2021-08-18: qty 20, 20d supply, fill #0
  Filled 2021-09-03: qty 20, 30d supply, fill #1

## 2021-08-15 MED ORDER — CLOBETASOL PROPIONATE 0.05 % EX SOLN
CUTANEOUS | 1 refills | Status: DC
  Filled 2021-08-15: qty 50, 30d supply, fill #0
  Filled 2021-10-31: qty 50, 30d supply, fill #1

## 2021-08-18 ENCOUNTER — Other Ambulatory Visit: Payer: Self-pay

## 2021-08-19 ENCOUNTER — Other Ambulatory Visit: Payer: Self-pay

## 2021-08-26 ENCOUNTER — Other Ambulatory Visit: Payer: Self-pay

## 2021-09-03 ENCOUNTER — Other Ambulatory Visit: Payer: Self-pay

## 2021-09-03 ENCOUNTER — Other Ambulatory Visit: Payer: Self-pay | Admitting: Family Medicine

## 2021-09-04 ENCOUNTER — Other Ambulatory Visit: Payer: Self-pay

## 2021-09-04 MED ORDER — MELOXICAM 15 MG PO TABS
ORAL_TABLET | Freq: Every day | ORAL | 0 refills | Status: DC | PRN
  Filled 2021-09-04: qty 30, 30d supply, fill #0

## 2021-09-04 MED ORDER — VITAMIN D (ERGOCALCIFEROL) 1.25 MG (50000 UNIT) PO CAPS
ORAL_CAPSULE | ORAL | 0 refills | Status: DC
Start: 2021-09-04 — End: 2021-11-29
  Filled 2021-09-04: qty 12, 84d supply, fill #0

## 2021-09-22 ENCOUNTER — Other Ambulatory Visit: Payer: Self-pay

## 2021-09-22 MED ORDER — IBUPROFEN 600 MG PO TABS
ORAL_TABLET | ORAL | 0 refills | Status: DC
  Filled 2021-09-22: qty 12, 3d supply, fill #0

## 2021-09-25 NOTE — Progress Notes (Signed)
Jenna [name] [address] [place] Phone: [phone] Subjective:   [name], am serving as a scribe for Dr. Hulan Saas.  I'm seeing this patient by the request  of:  Patient, No Pcp Per  CC: back and neck pain   [id number]  Jenna [name] is a 56 y.o. female coming in with complaint of back and neck pain. OMT 08/13/2021. Patient states doing well. Wearing back support has helped with that pain. Neck about the same. Having left wrist pain when weight bearing. [place] on it about 3 months ago, was hoping it would get better.  Medications patient has been prescribed: Vit D  Taking:         Reviewed prior external information including notes and imaging from previsou exam, outside providers and external EMR if available.   As well as notes that were available from care everywhere and other healthcare systems.  Past medical history, social, surgical and family history all reviewed in electronic medical record.  No pertanent information unless stated regarding to the chief complaint.   Past Medical History:  Diagnosis Date   Anemia    Wears contact lenses     Allergies  Allergen Reactions   Sulfa Antibiotics Diarrhea and Nausea And Vomiting     Review of Systems:  No headache, visual changes, nausea, vomiting, diarrhea, constipation, dizziness, abdominal pain, skin rash, fevers, chills, night sweats, weight loss, swollen lymph nodes, body aches, joint swelling, chest pain, shortness of breath, mood changes. POSITIVE muscle aches  Objective  Blood pressure 128/82, pulse 90, height '5\' 8"'$  (1.727 m), weight 173 lb (78.5 kg), SpO2 98 %.   General: No apparent distress alert and oriented x3 mood and affect normal, dressed appropriately.  HEENT: Pupils equal, extraocular movements intact  Respiratory: Patient's speak in full sentences and does not appear short of breath  Cardiovascular: No lower extremity edema, non  tender, no erythema  Gait MSK:  Neck exam shows tightness noted.  Patient does have tightness also noted over the sacroiliac joint as well.  Some limited sidebending of the neck bilaterally.  Left wrist exam has worsening pain tenderness over the TFCC.  Osteopathic findings  C3 flexed rotated and side bent right C7 flexed rotated and side bent left T3 extended rotated and side bent right inhaled rib T6 extended rotated and side bent left L2 flexed rotated and side bent right Sacrum right on right  Limited muscular skeletal ultrasound was performed and interpreted by Hulan Saas, M  Limited ultrasound of the patient's wrist shows some hypoechoic changes over the TFCC with some increase in neovascularization in the area. Impression: Acute on chronic TFCC tear   Assessment and Plan:  TFCC (triangular fibrocartilage complex) injury, left, initial encounter Patient does have what appears to be a TFCC tear noted.  Discussed icing regimen and home exercises.  Discussed bracing shortly.  Patient will want to try conservative therapy first.  Worsening pain consider injection at follow-up.  DDD (degenerative disc disease), cervical Tightness noted.  Does seem to have some difficulty with sidebending.  Discussed different medications again.  Discussed the meloxicam.  Increase activity slowly.  Discussed this exacerbation with chronic worsening symptoms.    Nonallopathic problems  Decision today to treat with OMT was based on Physical Exam  After verbal consent patient was treated with HVLA, ME, FPR techniques in cervical, rib, thoracic, lumbar, and sacral  areas  Patient tolerated the procedure well with improvement in symptoms  Patient given exercises, stretches and lifestyle modifications  See medications in patient instructions if given  Patient will follow up in 4-8 weeks    The above documentation has been reviewed and is accurate and complete Jenna Pulley, DO           Note: This dictation was prepared with Dragon dictation along with smaller phrase technology. Any transcriptional errors that result from this process are unintentional.

## 2021-09-29 ENCOUNTER — Ambulatory Visit: Payer: Self-pay

## 2021-09-29 ENCOUNTER — Ambulatory Visit (INDEPENDENT_AMBULATORY_CARE_PROVIDER_SITE_OTHER)
Admission: RE | Admit: 2021-09-29 | Discharge: 2021-09-29 | Disposition: A | Discharge status: Home / Self Care | Payer: 59 | Source: Ambulatory Visit | Attending: Family Medicine | Admitting: Family Medicine

## 2021-09-29 ENCOUNTER — Ambulatory Visit: Payer: 59 | Admitting: Family Medicine

## 2021-09-29 VITALS — BP 128/82 | HR 90 | Ht 68.0 in | Wt 173.0 lb

## 2021-09-29 DIAGNOSIS — M25532 Pain in left wrist: Secondary | ICD-10-CM | POA: Diagnosis not present

## 2021-09-29 DIAGNOSIS — M9901 Segmental and somatic dysfunction of cervical region: Secondary | ICD-10-CM

## 2021-09-29 DIAGNOSIS — M5030 Other cervical disc degeneration, unspecified cervical region: Secondary | ICD-10-CM

## 2021-09-29 DIAGNOSIS — M9903 Segmental and somatic dysfunction of lumbar region: Secondary | ICD-10-CM | POA: Diagnosis not present

## 2021-09-29 DIAGNOSIS — M9902 Segmental and somatic dysfunction of thoracic region: Secondary | ICD-10-CM | POA: Diagnosis not present

## 2021-09-29 DIAGNOSIS — M9904 Segmental and somatic dysfunction of sacral region: Secondary | ICD-10-CM

## 2021-09-29 DIAGNOSIS — S6982XA Other specified injuries of left wrist, hand and finger(s), initial encounter: Secondary | ICD-10-CM | POA: Diagnosis not present

## 2021-09-29 NOTE — Patient Instructions (Addendum)
Good to see you! Wear wrist brace day and night for 2 weeks, then at night for 2 weeks See you again in 6-7 weeks

## 2021-09-29 NOTE — Assessment & Plan Note (Signed)
Patient does have what appears to be a TFCC tear noted.  Discussed icing regimen and home exercises.  Discussed bracing shortly.  Patient will want to try conservative therapy first.  Worsening pain consider injection at follow-up.

## 2021-09-29 NOTE — Assessment & Plan Note (Signed)
Tightness noted.  Does seem to have some difficulty with sidebending.  Discussed different medications again.  Discussed the meloxicam.  Increase activity slowly.  Discussed this exacerbation with chronic worsening symptoms.

## 2021-10-13 ENCOUNTER — Other Ambulatory Visit: Payer: Self-pay

## 2021-10-13 MED ORDER — IBUPROFEN 600 MG PO TABS
ORAL_TABLET | ORAL | 0 refills | Status: DC
  Filled 2021-10-13: qty 12, 3d supply, fill #0

## 2021-10-14 ENCOUNTER — Other Ambulatory Visit: Payer: Self-pay

## 2021-10-14 ENCOUNTER — Encounter: Payer: Self-pay | Admitting: Family Medicine

## 2021-10-16 ENCOUNTER — Other Ambulatory Visit: Payer: Self-pay

## 2021-10-19 ENCOUNTER — Other Ambulatory Visit: Payer: Self-pay

## 2021-10-21 ENCOUNTER — Other Ambulatory Visit: Payer: Self-pay

## 2021-10-23 ENCOUNTER — Other Ambulatory Visit: Payer: Self-pay

## 2021-10-24 ENCOUNTER — Other Ambulatory Visit: Payer: Self-pay

## 2021-10-27 ENCOUNTER — Other Ambulatory Visit: Payer: Self-pay

## 2021-10-27 MED ORDER — AMOXICILLIN 500 MG PO CAPS
ORAL_CAPSULE | ORAL | 0 refills | Status: DC
  Filled 2021-10-27: qty 20, 5d supply, fill #0

## 2021-10-31 ENCOUNTER — Other Ambulatory Visit: Payer: Self-pay

## 2021-11-04 ENCOUNTER — Other Ambulatory Visit: Payer: Self-pay

## 2021-11-04 NOTE — Progress Notes (Unsigned)
[name] [address] [place] Phone: [phone] Subjective:   [name], am serving as a scribe for Dr. Hulan Saas.  I'm seeing this patient by the request  of:  Patient, No Pcp Per  CC: neck and back pain   [id number]  Jenna [name] is a 56 y.o. female coming in with complaint of back and neck pain. OMT 09/29/2021. Also f/u for [name] tear. Patient states doing well. Same areas causing pain in neck and shoulders. Wrist still hurts when bearing weight and when she started to only wear the wrist brace at night she noted that ROM in wrist was still painful.  Medications patient has been prescribed: Meloxicam  Taking:         Reviewed prior external information including notes and imaging from previsou exam, outside providers and external EMR if available.   As well as notes that were available from care everywhere and other healthcare systems.  Past medical history, social, surgical and family history all reviewed in electronic medical record.  No pertanent information unless stated regarding to the chief complaint.   Past Medical History:  Diagnosis Date   Anemia    Wears contact lenses     Allergies  Allergen Reactions   Sulfa Antibiotics Diarrhea and Nausea And Vomiting     Review of Systems:  No headache, visual changes, nausea, vomiting, diarrhea, constipation, dizziness, abdominal pain, skin rash, fevers, chills, night sweats, weight loss, swollen lymph nodes, body aches, joint swelling, chest pain, shortness of breath, mood changes. POSITIVE muscle aches  Objective  Blood pressure 124/86, pulse 81, height '5\' 8"'$  (1.727 m), weight 178 lb (80.7 kg), SpO2 98 %.   General: No apparent distress alert and oriented x3 mood and affect normal, dressed appropriately.  HEENT: Pupils equal, extraocular movements intact  Respiratory: Patient's speak in full sentences and does not appear short of breath   Cardiovascular: No lower extremity edema, non tender, no erythema  Gait MSK:  Back low back does have some loss of lordosis.  Some limited sidebending bilaterally.  Lacks last 5 degrees of extension  Limited muscular skeletal ultrasound was performed and interpreted by Hulan Saas, M  Limited ultrasound of patient's wrist shows still shows some mild hypoechoic changes of the TFCC.  No significant displacement noted at the moment.  Still some increasing Doppler flow in neovascularization.  Osteopathic findings C5 flexed rotated and side bent left T3 extended rotated and side bent right inhaled rib T8 extended rotated and side bent left L2 flexed rotated and side bent right Sacrum right on right       Assessment and Plan:  TFCC (triangular fibrocartilage complex) injury, left, initial encounter Interval improvement but continues to have pain.  Discussed different treatment options.  Patient is elected to continue with conservative therapy.  Discussed posture and ergonomics and proper lifting mechanics.  Follow-up again in 6 to 8 weeks.    Nonallopathic problems  Decision today to treat with OMT was based on Physical Exam  After verbal consent patient was treated with HVLA, ME, FPR techniques in cervical, rib, thoracic, lumbar, and sacral  areas  Patient tolerated the procedure well with improvement in symptoms  Patient given exercises, stretches and lifestyle modifications  See medications in patient instructions if given  Patient will follow up in 4-8 weeks     The above documentation has been reviewed and is accurate and complete [name], DO  Note: This dictation was prepared with Dragon dictation along with smaller phrase technology. Any transcriptional errors that result from this process are unintentional.

## 2021-11-06 ENCOUNTER — Other Ambulatory Visit: Payer: Self-pay

## 2021-11-10 ENCOUNTER — Ambulatory Visit: Payer: 59 | Admitting: Family Medicine

## 2021-11-10 ENCOUNTER — Ambulatory Visit: Payer: Self-pay

## 2021-11-10 VITALS — BP 124/86 | HR 81 | Ht 68.0 in | Wt 178.0 lb

## 2021-11-10 DIAGNOSIS — M9908 Segmental and somatic dysfunction of rib cage: Secondary | ICD-10-CM | POA: Diagnosis not present

## 2021-11-10 DIAGNOSIS — M9904 Segmental and somatic dysfunction of sacral region: Secondary | ICD-10-CM | POA: Diagnosis not present

## 2021-11-10 DIAGNOSIS — M5030 Other cervical disc degeneration, unspecified cervical region: Secondary | ICD-10-CM | POA: Diagnosis not present

## 2021-11-10 DIAGNOSIS — S6982XA Other specified injuries of left wrist, hand and finger(s), initial encounter: Secondary | ICD-10-CM | POA: Diagnosis not present

## 2021-11-10 DIAGNOSIS — M25532 Pain in left wrist: Secondary | ICD-10-CM | POA: Diagnosis not present

## 2021-11-10 DIAGNOSIS — M9902 Segmental and somatic dysfunction of thoracic region: Secondary | ICD-10-CM

## 2021-11-10 DIAGNOSIS — M9901 Segmental and somatic dysfunction of cervical region: Secondary | ICD-10-CM | POA: Diagnosis not present

## 2021-11-10 DIAGNOSIS — M9903 Segmental and somatic dysfunction of lumbar region: Secondary | ICD-10-CM

## 2021-11-10 NOTE — Assessment & Plan Note (Signed)
Interval improvement but continues to have pain.  Discussed different treatment options.  Patient is elected to continue with conservative therapy.  Discussed posture and ergonomics and proper lifting mechanics.  Follow-up again in 6 to 8 weeks.

## 2021-11-10 NOTE — Assessment & Plan Note (Signed)
Patient does have arthritic changes noted.  Discussed different treatment options.  Ibuprofen or meloxicam as needed for the exacerbations.  Follow-up again in 6 to 8 weeks

## 2021-11-10 NOTE — Patient Instructions (Signed)
Looks better not great Brace with heavy lifting Continue all exercises Neck looks good

## 2021-11-12 ENCOUNTER — Other Ambulatory Visit: Payer: Self-pay

## 2021-11-12 MED ORDER — MUPIROCIN 2 % EX OINT
TOPICAL_OINTMENT | CUTANEOUS | 0 refills | Status: AC
  Filled 2021-11-12: qty 22, 7d supply, fill #0

## 2021-11-13 ENCOUNTER — Other Ambulatory Visit: Payer: Self-pay

## 2021-11-13 MED ORDER — CHLORHEXIDINE GLUCONATE 0.12 % MT SOLN
OROMUCOSAL | 0 refills | Status: DC
  Filled 2021-11-13: qty 473, 16d supply, fill #0

## 2021-11-13 MED ORDER — AMOXICILLIN 500 MG PO CAPS
500.0000 mg | ORAL_CAPSULE | Freq: Four times a day (QID) | ORAL | 0 refills | Status: DC
  Filled 2021-11-13: qty 20, 5d supply, fill #0

## 2021-11-14 DIAGNOSIS — K047 Periapical abscess without sinus: Secondary | ICD-10-CM | POA: Insufficient documentation

## 2021-11-14 DIAGNOSIS — T4995XA Adverse effect of unspecified topical agent, initial encounter: Secondary | ICD-10-CM | POA: Diagnosis not present

## 2021-11-14 DIAGNOSIS — R238 Other skin changes: Secondary | ICD-10-CM | POA: Diagnosis not present

## 2021-11-29 ENCOUNTER — Other Ambulatory Visit: Payer: Self-pay | Admitting: Family Medicine

## 2021-12-01 ENCOUNTER — Other Ambulatory Visit: Payer: Self-pay

## 2021-12-02 ENCOUNTER — Other Ambulatory Visit: Payer: Self-pay

## 2021-12-02 MED ORDER — VITAMIN D (ERGOCALCIFEROL) 1.25 MG (50000 UNIT) PO CAPS
ORAL_CAPSULE | ORAL | 0 refills | Status: DC
  Filled 2021-12-02: qty 12, 84d supply, fill #0

## 2021-12-23 NOTE — Progress Notes (Signed)
[place] Phone: [phone] Subjective:   [name], am serving as a scribe for Dr. Hulan Saas.  I'm seeing this patient by the request  of:  Patient, No Pcp Per  CC: back and neck pain follow up   [id number]  Jenna [name] is a 56 y.o. female coming in with complaint of back and neck pain. OMT on 11/10/2021. Also follow up on wrist pain. Patient states that her back pain increased over past 2 weeks which is common prior to the appt. Using Meloxicam.   R shoulder pain persists over top and into R scapula. Will have catching in R scapula at times.   Medications patient has been prescribed:   Taking:         Reviewed prior external information including notes and imaging from previsou exam, outside providers and external EMR if available.   As well as notes that were available from care everywhere and other healthcare systems.  Past medical history, social, surgical and family history all reviewed in electronic medical record.  No pertanent information unless stated regarding to the chief complaint.   Past Medical History:  Diagnosis Date   Anemia    Wears contact lenses     Allergies  Allergen Reactions   Sulfa Antibiotics Diarrhea and Nausea And Vomiting     Review of Systems:  No headache, visual changes, nausea, vomiting, diarrhea, constipation, dizziness, abdominal pain, skin rash, fevers, chills, night sweats, weight loss, swollen lymph nodes, body aches, joint swelling, chest pain, shortness of breath, mood changes. POSITIVE muscle aches  Objective  Blood pressure 124/80, pulse 86, height '5\' 8"'$  (1.727 m), weight 179 lb (81.2 kg), SpO2 98 %.   General: No apparent distress alert and oriented x3 mood and affect normal, dressed appropriately.  HEENT: Pupils equal, extraocular movements intact  Respiratory: Patient's speak in full sentences and does not appear short of breath   Cardiovascular: No lower extremity edema, non tender, no erythema  Neck exam does have some limited range of motion.  Lacks last 15 degrees of extension and only has 5 degrees of sidebending to the right.  Tightness noted in the parascapular region right greater than left  Osteopathic findings C5 flexed rotated and side bent right T3 extended rotated and side bent right inhaled rib T8 extended rotated and side bent left L1 flexed rotated and side bent right Sacrum right on right     Assessment and Plan:  DDD (degenerative disc disease), cervical Degenerative disc disease still noted.  Discussed icing regimen and home exercises.  Patient states that it is frustrating because she is always having this discomfort.  The patient has the meloxicam 15 mg to take as needed.  Increase activity slowly otherwise.  Follow-up again in 6 to 8 weeks    Nonallopathic problems  Decision today to treat with OMT was based on Physical Exam  After verbal consent patient was treated with HVLA, ME, FPR techniques in cervical, rib, thoracic, lumbar, and sacral  areas  Patient tolerated the procedure well with improvement in symptoms  Patient given exercises, stretches and lifestyle modifications  See medications in patient instructions if given  Patient will follow up in 4-8 weeks    The above documentation has been reviewed and is accurate and complete [name], DO          Note: This dictation was prepared with Dragon dictation along with smaller phrase technology. Any  transcriptional errors that result from this process are unintentional.

## 2021-12-25 DIAGNOSIS — Z9109 Other allergy status, other than to drugs and biological substances: Secondary | ICD-10-CM | POA: Diagnosis not present

## 2021-12-25 DIAGNOSIS — Z889 Allergy status to unspecified drugs, medicaments and biological substances status: Secondary | ICD-10-CM | POA: Diagnosis not present

## 2021-12-29 ENCOUNTER — Encounter: Payer: Self-pay | Admitting: Family Medicine

## 2021-12-29 ENCOUNTER — Ambulatory Visit: Payer: 59 | Admitting: Family Medicine

## 2021-12-29 VITALS — BP 124/80 | HR 86 | Ht 68.0 in | Wt 179.0 lb

## 2021-12-29 DIAGNOSIS — Z1231 Encounter for screening mammogram for malignant neoplasm of breast: Secondary | ICD-10-CM | POA: Diagnosis not present

## 2021-12-29 DIAGNOSIS — M9902 Segmental and somatic dysfunction of thoracic region: Secondary | ICD-10-CM | POA: Diagnosis not present

## 2021-12-29 DIAGNOSIS — M9904 Segmental and somatic dysfunction of sacral region: Secondary | ICD-10-CM

## 2021-12-29 DIAGNOSIS — M9908 Segmental and somatic dysfunction of rib cage: Secondary | ICD-10-CM

## 2021-12-29 DIAGNOSIS — M5030 Other cervical disc degeneration, unspecified cervical region: Secondary | ICD-10-CM

## 2021-12-29 DIAGNOSIS — M9901 Segmental and somatic dysfunction of cervical region: Secondary | ICD-10-CM | POA: Diagnosis not present

## 2021-12-29 DIAGNOSIS — M9903 Segmental and somatic dysfunction of lumbar region: Secondary | ICD-10-CM | POA: Diagnosis not present

## 2021-12-29 NOTE — Assessment & Plan Note (Signed)
Degenerative disc disease still noted.  Discussed icing regimen and home exercises.  Patient states that it is frustrating because she is always having this discomfort.  The patient has the meloxicam 15 mg to take as needed.  Increase activity slowly otherwise.  Follow-up again in 6 to 8 weeks

## 2021-12-29 NOTE — Patient Instructions (Signed)
Good to see you Keep doing exercises Watch posture See me in 5-6 weeks

## 2022-01-11 ENCOUNTER — Other Ambulatory Visit: Payer: Self-pay

## 2022-01-12 ENCOUNTER — Other Ambulatory Visit: Payer: Self-pay

## 2022-01-19 IMAGING — DX DG CERVICAL SPINE 2 OR 3 VIEWS
3 series · 3 of 3 positions shown · non-contrast
Comparison: Cervical spine radiograph February 03, 2018.

CLINICAL DATA: Chronic cervical spine pain.

EXAM:
CERVICAL SPINE - 2-3 VIEW

[c-spine lat]
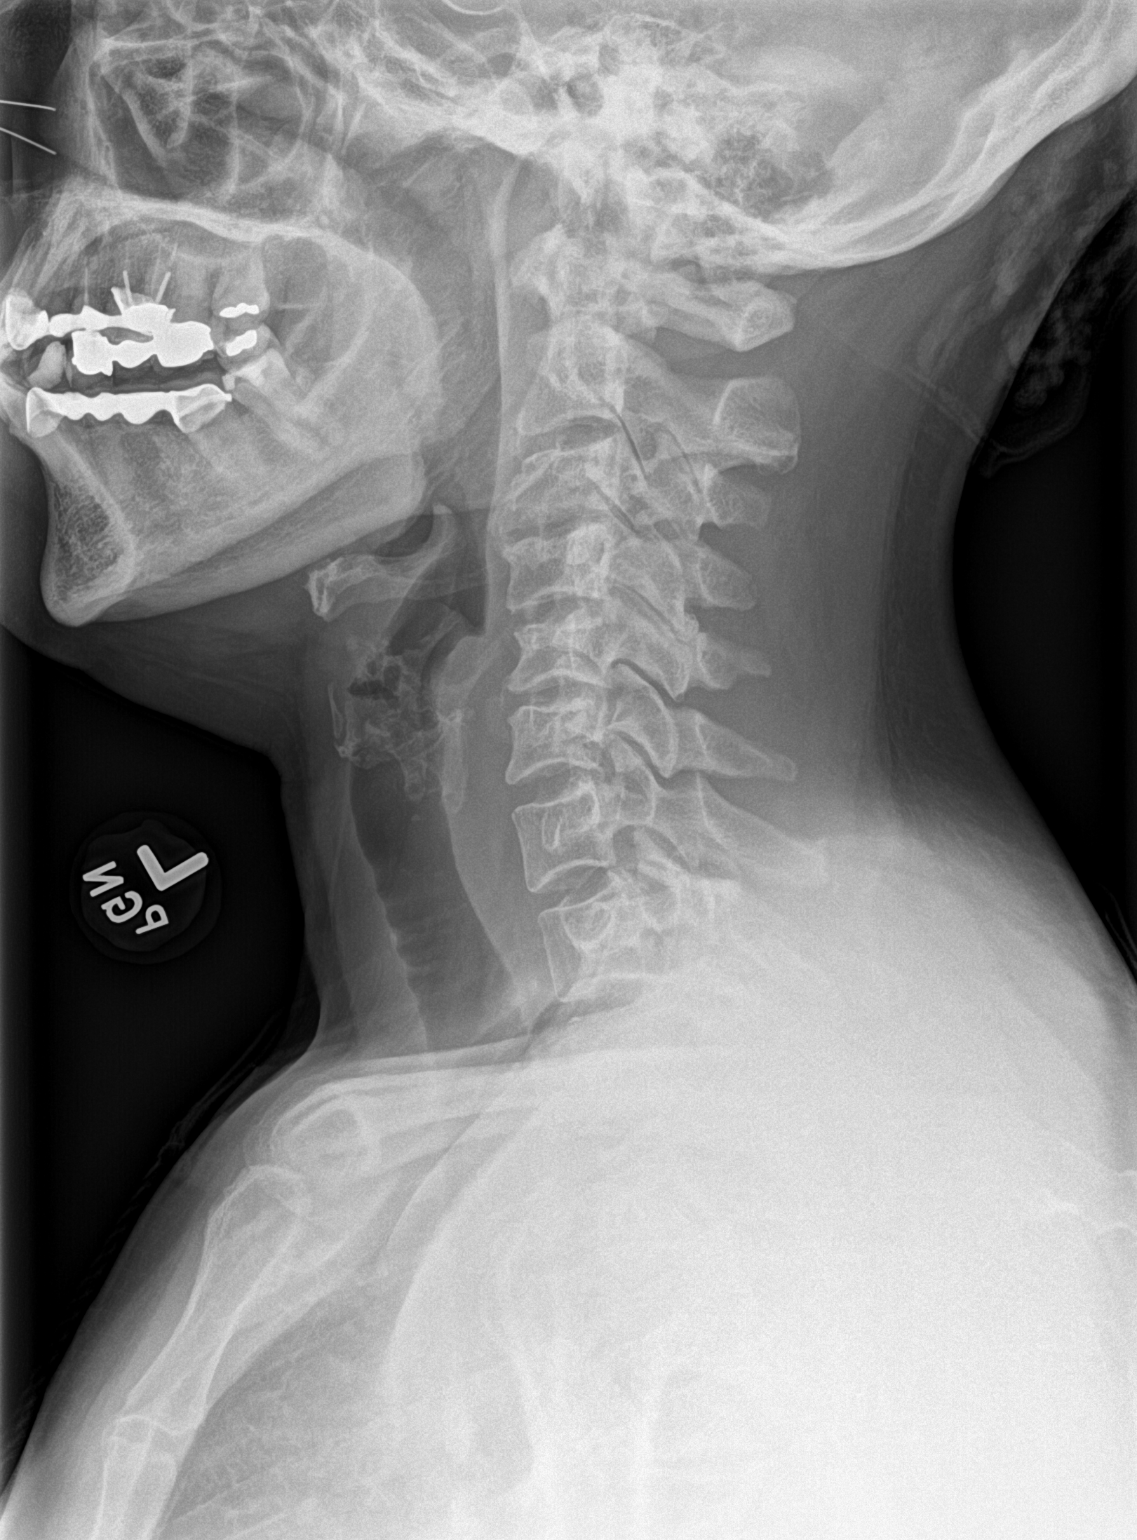

[c-spine ap]
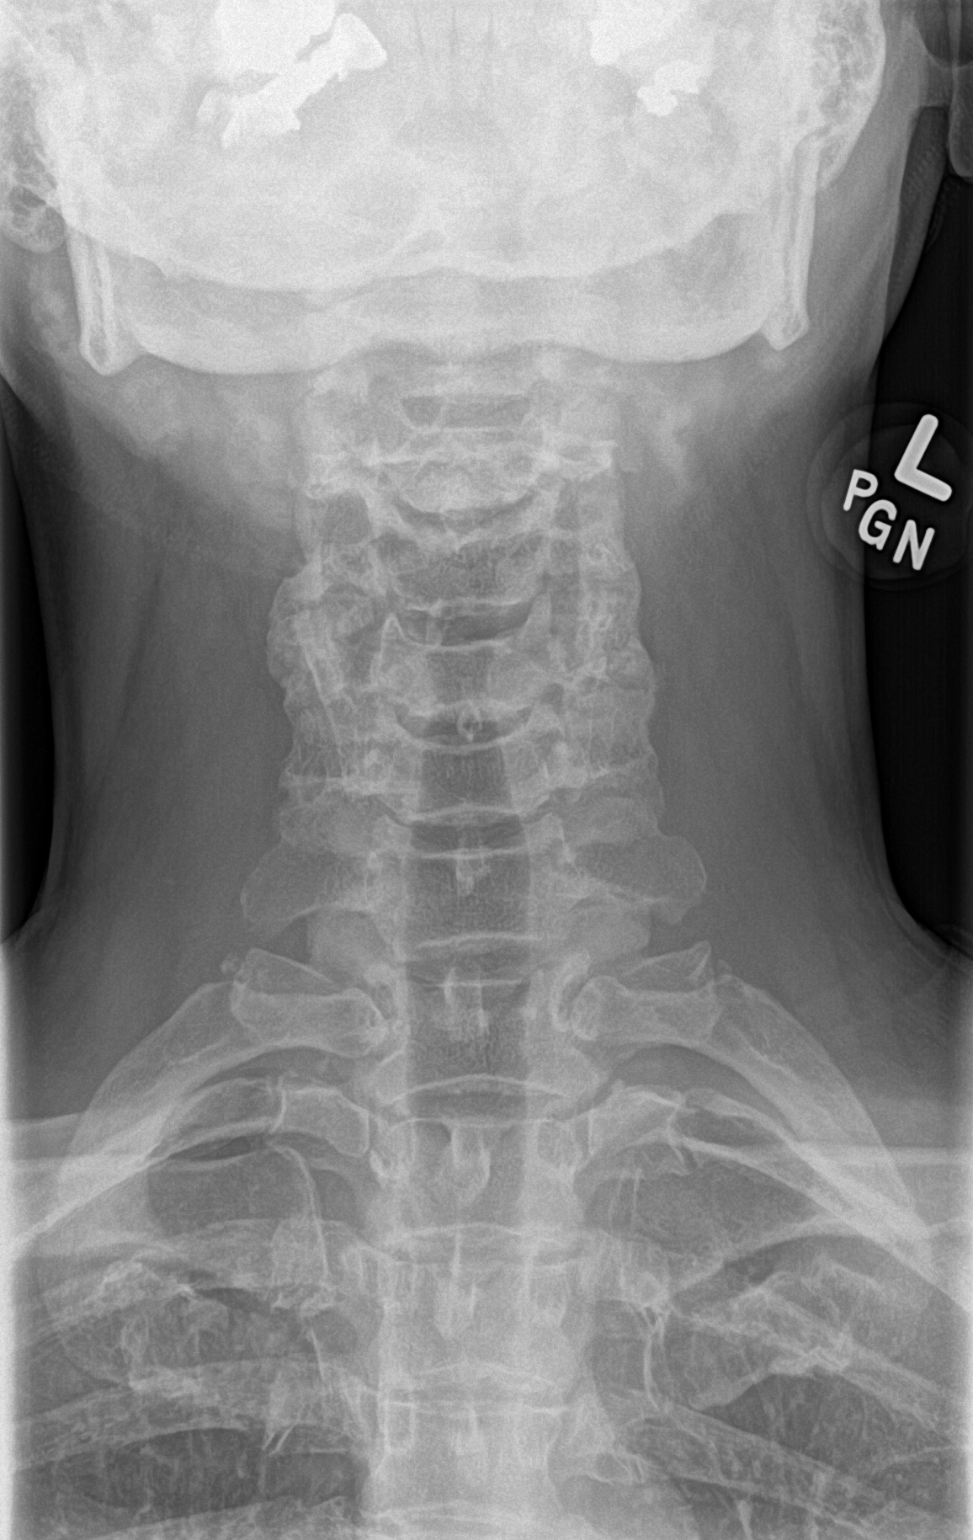

[c-spine open mouth]
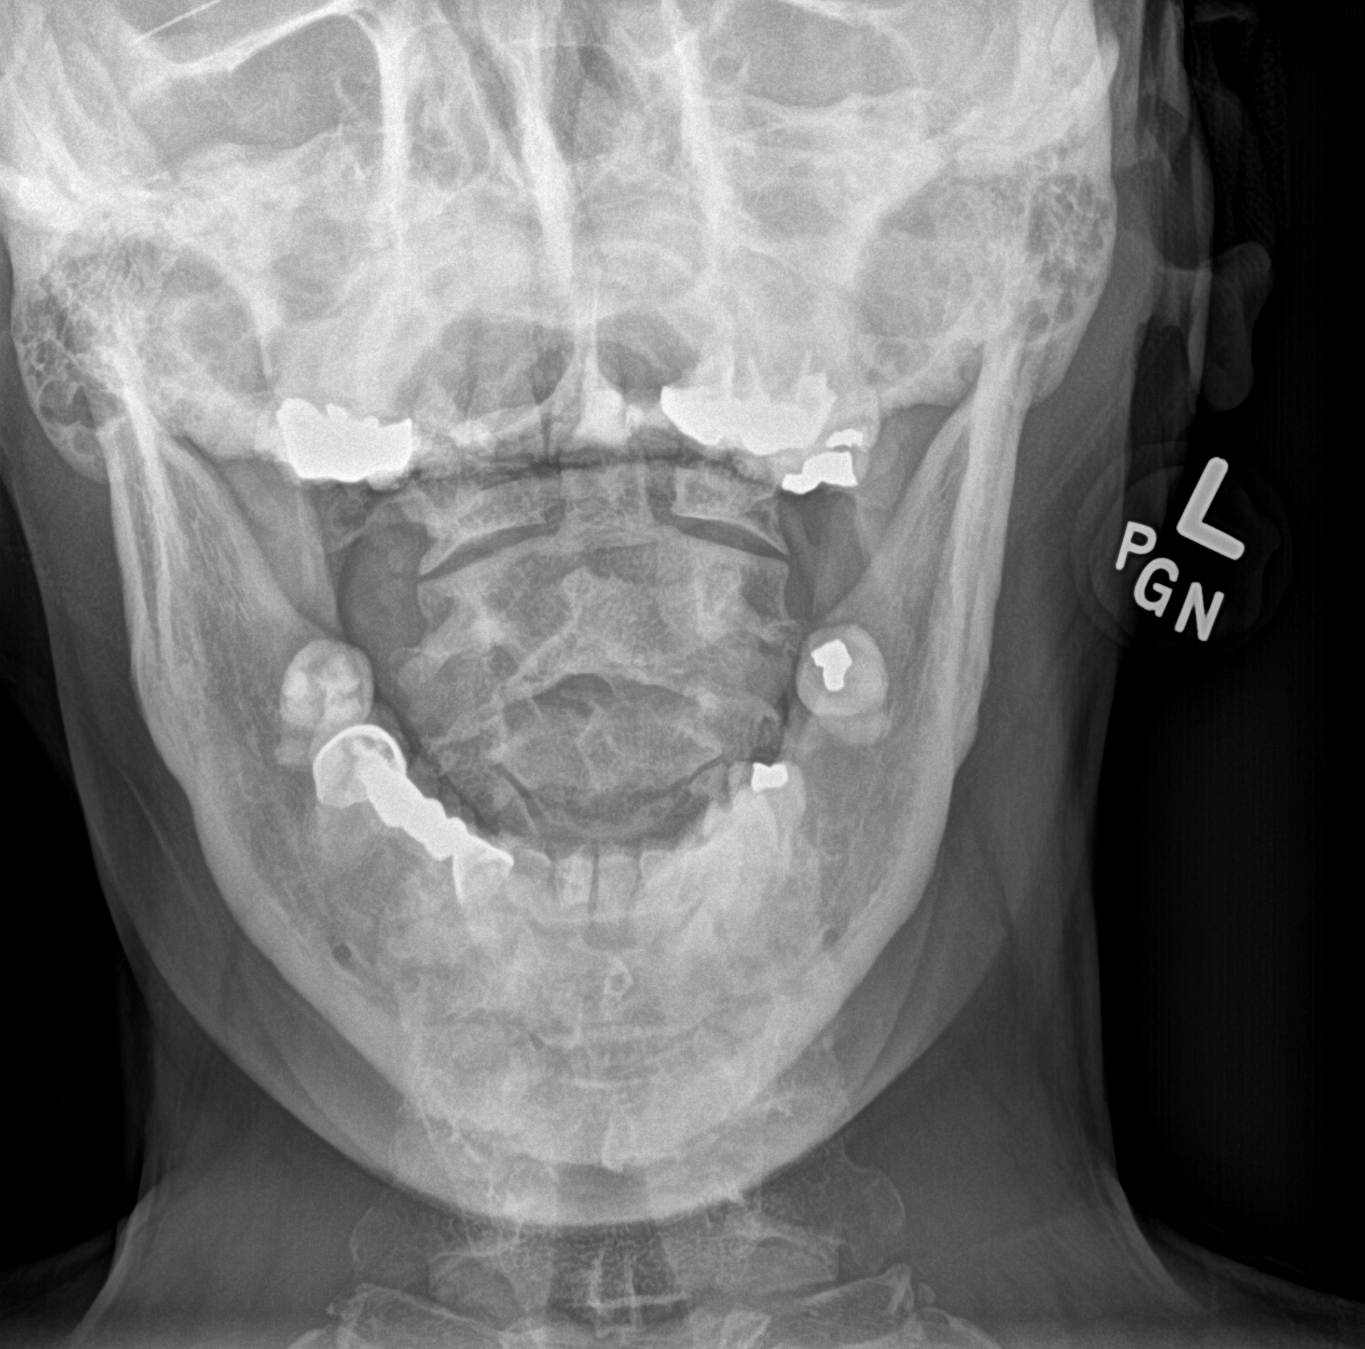

[3 of 3 positions shown; findings below may reference images not displayed]

FINDINGS: Seven cervical type vertebral bodies are visualized. There is
similar chronic mild multilevel degenerative change of the cervical
spine most prominent at C3-C5. No fracture or other significant bone
abnormalities are identified. Dental hardware. Lung apices are
clear.
IMPRESSION: Similar chronic mild multilevel degenerative change of the cervical
spine most prominent at C3-C5. No acute findings.

## 2022-01-20 ENCOUNTER — Other Ambulatory Visit: Payer: Self-pay

## 2022-01-28 ENCOUNTER — Other Ambulatory Visit: Payer: Self-pay | Admitting: Family Medicine

## 2022-01-28 ENCOUNTER — Other Ambulatory Visit: Payer: Self-pay

## 2022-01-28 MED ORDER — VITAMIN D (ERGOCALCIFEROL) 1.25 MG (50000 UNIT) PO CAPS
ORAL_CAPSULE | ORAL | 0 refills | Status: DC
  Filled 2022-01-28: qty 12, fill #0
  Filled 2022-02-12: qty 12, 84d supply, fill #0

## 2022-01-29 ENCOUNTER — Other Ambulatory Visit: Payer: Self-pay

## 2022-01-29 NOTE — Progress Notes (Signed)
[name] [place] Phone: [phone] Subjective:   Jenna [name], am serving as a scribe for Dr. Hulan Saas.  I'm seeing this patient by the request  of:  Patient, No Pcp Per  CC: Neck pain and back pain follow-up  [id number]  Jenna [name] is a 56 y.o. female coming in with complaint of back and neck pain. OMT 12/29/2021. Patient states the right shoulder upper back tightness as usual.   Medications patient has been prescribed: Meloxicam, Vit D  Taking: Meloxicam, Vit D          Reviewed prior external information including notes and imaging from previsou exam, outside providers and external EMR if available.   As well as notes that were available from care everywhere and other healthcare systems.  Past medical history, social, surgical and family history all reviewed in electronic medical record.  No pertanent information unless stated regarding to the chief complaint.   Past Medical History:  Diagnosis Date   Anemia    Wears contact lenses     Allergies  Allergen Reactions   Sulfa Antibiotics Diarrhea and Nausea And Vomiting     Review of Systems:  No headache, visual changes, nausea, vomiting, diarrhea, constipation, dizziness, abdominal pain, skin rash, fevers, chills, night sweats, weight loss, swollen lymph nodes, body aches, joint swelling, chest pain, shortness of breath, mood changes. POSITIVE muscle aches  Objective  Blood pressure 110/80, pulse 76, height '5\' 8"'$  (1.727 m), weight 177 lb (80.3 kg), SpO2 99 %.   General: No apparent distress alert and oriented x3 mood and affect normal, dressed appropriately.  HEENT: Pupils equal, extraocular movements intact  Respiratory: Patient's speak in full sentences and does not appear short of breath  Cardiovascular: No lower extremity edema, non tender, no erythema  Neck exam does have some loss of lordosis.  Tightness noted with FABER of  the lower back as well though.  Patient does have limited sidebending of the neck bilaterally with mild crepitus.  Worsening pain with left-sided rotation of the neck.  Negative Spurling's  Osteopathic findings  C2 flexed rotated and side bent left C5 flexed rotated and side bent left T3 extended rotated and side bent left inhaled rib T7 extended rotated and side bent left L2 flexed rotated and side bent right Sacrum right on right       Assessment and Plan:  DDD (degenerative disc disease), cervical Chronic problem with worsening symptoms.  We discussed ergonomics again.  Meloxicam 15 mg daily and 5-day burst when needed.  Patient is trying to avoid it secondary to some gastroesophageal reflux disease.  Discussed with patient about icing regimen and home exercises, discussed which activities to do and which ones to avoid, increase activity slowly otherwise.  Follow-up with me again in 6 to 8 weeks    Nonallopathic problems  Decision today to treat with OMT was based on Physical Exam  After verbal consent patient was treated with HVLA, ME, FPR techniques in cervical, rib, thoracic, lumbar, and sacral  areas  Patient tolerated the procedure well with improvement in symptoms  Patient given exercises, stretches and lifestyle modifications  See medications in patient instructions if given  Patient will follow up in 4-8 weeks     The above documentation has been reviewed and is accurate and complete Jenna Pulley, DO         Note: This dictation was prepared with Dragon dictation along with smaller phrase  technology. Any transcriptional errors that result from this process are unintentional.

## 2022-01-30 ENCOUNTER — Other Ambulatory Visit: Payer: Self-pay

## 2022-01-30 MED ORDER — CLOBETASOL PROPIONATE 0.05 % EX SOLN
CUTANEOUS | 1 refills | Status: DC
  Filled 2022-01-30: qty 50, 30d supply, fill #0
  Filled 2022-03-09: qty 50, 30d supply, fill #1

## 2022-02-02 ENCOUNTER — Ambulatory Visit: Payer: 59 | Admitting: Family Medicine

## 2022-02-02 ENCOUNTER — Other Ambulatory Visit: Payer: Self-pay

## 2022-02-02 VITALS — BP 110/80 | HR 76 | Ht 68.0 in | Wt 177.0 lb

## 2022-02-02 DIAGNOSIS — M9902 Segmental and somatic dysfunction of thoracic region: Secondary | ICD-10-CM | POA: Diagnosis not present

## 2022-02-02 DIAGNOSIS — M9904 Segmental and somatic dysfunction of sacral region: Secondary | ICD-10-CM

## 2022-02-02 DIAGNOSIS — M5030 Other cervical disc degeneration, unspecified cervical region: Secondary | ICD-10-CM | POA: Diagnosis not present

## 2022-02-02 DIAGNOSIS — M9903 Segmental and somatic dysfunction of lumbar region: Secondary | ICD-10-CM

## 2022-02-02 DIAGNOSIS — M9901 Segmental and somatic dysfunction of cervical region: Secondary | ICD-10-CM

## 2022-02-02 DIAGNOSIS — M9908 Segmental and somatic dysfunction of rib cage: Secondary | ICD-10-CM | POA: Diagnosis not present

## 2022-02-02 MED ORDER — CLOBETASOL PROPIONATE 0.05 % EX SOLN
CUTANEOUS | 1 refills | Status: DC
  Filled 2022-02-02: qty 50, 30d supply, fill #0
  Filled 2022-05-21: qty 50, 60d supply, fill #0

## 2022-02-02 NOTE — Patient Instructions (Addendum)
Good to see you Voltaren gel OTC Happy 51th Birth[birth year]! Follow up in 7-8 weeks

## 2022-02-02 NOTE — Assessment & Plan Note (Signed)
Chronic problem with worsening symptoms.  We discussed ergonomics again.  Meloxicam 15 mg daily and 5-day burst when needed.  Patient is trying to avoid it secondary to some gastroesophageal reflux disease.  Discussed with patient about icing regimen and home exercises, discussed which activities to do and which ones to avoid, increase activity slowly otherwise.  Follow-up with me again in 6 to 8 weeks

## 2022-02-05 ENCOUNTER — Other Ambulatory Visit: Payer: Self-pay

## 2022-02-12 ENCOUNTER — Other Ambulatory Visit: Payer: Self-pay

## 2022-02-17 ENCOUNTER — Other Ambulatory Visit: Payer: Self-pay

## 2022-02-17 DIAGNOSIS — L700 Acne vulgaris: Secondary | ICD-10-CM | POA: Diagnosis not present

## 2022-02-17 DIAGNOSIS — R208 Other disturbances of skin sensation: Secondary | ICD-10-CM | POA: Diagnosis not present

## 2022-02-17 DIAGNOSIS — L668 Other cicatricial alopecia: Secondary | ICD-10-CM | POA: Diagnosis not present

## 2022-02-17 DIAGNOSIS — L7211 Pilar cyst: Secondary | ICD-10-CM | POA: Diagnosis not present

## 2022-02-17 DIAGNOSIS — L298 Other pruritus: Secondary | ICD-10-CM | POA: Diagnosis not present

## 2022-02-17 DIAGNOSIS — L720 Epidermal cyst: Secondary | ICD-10-CM | POA: Diagnosis not present

## 2022-02-17 MED ORDER — FLUTICASONE PROPIONATE 50 MCG/ACT NA SUSP
NASAL | 11 refills | Status: DC
  Filled 2022-02-17: qty 16, 30d supply, fill #0
  Filled 2022-07-21: qty 16, 30d supply, fill #1

## 2022-02-18 ENCOUNTER — Other Ambulatory Visit: Payer: Self-pay

## 2022-02-18 ENCOUNTER — Other Ambulatory Visit: Payer: Self-pay | Admitting: Family Medicine

## 2022-02-18 MED ORDER — MELOXICAM 15 MG PO TABS
ORAL_TABLET | Freq: Every day | ORAL | 0 refills | Status: DC | PRN
  Filled 2022-02-18: qty 30, 30d supply, fill #0

## 2022-03-17 ENCOUNTER — Encounter: Payer: Self-pay | Admitting: Family Medicine

## 2022-03-17 NOTE — Progress Notes (Signed)
[name] [place] [address] [place] Phone: [phone] Subjective:   [name], am serving as a scribe for Dr. Hulan Saas.  I'm seeing this patient by the request  of:  Patient, No Pcp Per  CC: neck and shoulder pain   [id number]  CARITA SOLLARS is a 57 y.o. female coming in with complaint of back and neck pain. OMT 02/02/2022. Patient states here for manipulation. Right shoulder has been hurting her lately, mostly when she sleeps.  Medications patient has been prescribed: Meloxicam, Vit D  Taking:         Reviewed prior external information including notes and imaging from previsou exam, outside providers and external EMR if available.   As well as notes that were available from care everywhere and other healthcare systems.  Past medical history, social, surgical and family history all reviewed in electronic medical record.  No pertanent information unless stated regarding to the chief complaint.   Past Medical History:  Diagnosis Date   Anemia    Wears contact lenses     Allergies  Allergen Reactions   Sulfa Antibiotics Diarrhea and Nausea And Vomiting     Review of Systems:  No headache, visual changes, nausea, vomiting, diarrhea, constipation, dizziness, abdominal pain, skin rash, fevers, chills, night sweats, weight loss, swollen lymph nodes, body aches, joint swelling, chest pain, shortness of breath, mood changes. POSITIVE muscle aches  Objective  Blood pressure 138/84, pulse 95, height '5\' 8"'$  (1.727 m), weight 183 lb (83 kg), SpO2 98 %.   General: No apparent distress alert and oriented x3 mood and affect normal, dressed appropriately.  HEENT: Pupils equal, extraocular movements intact  Respiratory: Patient's speak in full sentences and does not appear short of breath  Cardiovascular: No lower extremity edema, non tender, no erythema  Neck exam does have some tightness noted with some limited  sidebending bilaterally.  Patient does have some tightness of the lower back noted as well.  Right shoulder exam no shows the patient does have some limited sidebending rotation and internal and external.  Osteopathic findings  C2 flexed rotated and side bent right C5 flexed rotated and side bent left C7 flexed rotated and side bent right T3 extended rotated and side bent right inhaled rib T9 extended rotated and side bent left L3 flexed rotated and side bent right Sacrum right on right   Procedure: Real-time Ultrasound Guided Injection of right glenohumeral joint Device: GE Logiq Q7  Ultrasound guided injection is preferred based studies that show increased duration, increased effect, greater accuracy, decreased procedural pain, increased response rate with ultrasound guided versus blind injection.  Verbal informed consent obtained.  Time-out conducted.  Noted no overlying erythema, induration, or other signs of local infection.  Skin prepped in a sterile fashion.  Local anesthesia: Topical Ethyl chloride.  With sterile technique and under real time ultrasound guidance:  Joint visualized.  23g 1  inch needle inserted posterior approach. Pictures taken for needle placement. Patient did have injection of 2 cc of 1% lidocaine, 2 cc of 0.5% Marcaine, and 1.0 cc of Kenalog 40 mg/dL. Completed without difficulty  Pain immediately resolved suggesting accurate placement of the medication.  Advised to call if fevers/chills, erythema, induration, drainage, or persistent bleeding.  Impression: Technically successful ultrasound guided injection.  Procedure: Real-time Ultrasound Guided Injection of right acromioclavicular joint Device: GE Logiq Q7 Ultrasound guided injection is preferred based studies that show increased duration, increased effect, greater accuracy, decreased procedural  pain, increased response rate, and decreased cost with ultrasound guided versus blind injection.  Verbal  informed consent obtained.  Time-out conducted.  Noted no overlying erythema, induration, or other signs of local infection.  Skin prepped in a sterile fashion.  Local anesthesia: Topical Ethyl chloride.  With sterile technique and under real time ultrasound guidance: With a 25-gauge half inch needle injected with 0.5 cc of 0.5% Marcaine and 0.5 cc of Kenalog 40 mg/mL Completed without difficulty  Pain immediately resolved suggesting accurate placement of the medication.  Advised to call if fevers/chills, erythema, induration, drainage, or persistent bleeding.  Impression: Technically successful ultrasound guided injection.    Assessment and Plan:  Pain in right acromioclavicular joint Patient given injection and tolerated the procedure well, discussed icing regimen and home exercises, discussed which activities to do and which ones to avoid.  Increase activity slowly.  Follow-up again in 6 to 8 weeks  Right shoulder pain Acute on chronic.  Discussed with patient with worsening symptoms.  Has had labral pathology previously.  Discussed with patient about icing regimen and home exercises, which activities to do and which ones to avoid.  Increase activity slowly.  Ultrasound did show some thickening of the capsule so could be early frozen shoulder as well.  Hopefully patient will respond well follow-up again in 6 to 8 weeks    Nonallopathic problems  Decision today to treat with OMT was based on Physical Exam  After verbal consent patient was treated with HVLA, ME, FPR techniques in cervical, rib, thoracic, lumbar, and sacral  areas  Patient tolerated the procedure well with improvement in symptoms  Patient given exercises, stretches and lifestyle modifications  See medications in patient instructions if given  Patient will follow up in 4-8 weeks    The above documentation has been reviewed and is accurate and complete [name], DO          Note: This dictation was  prepared with Dragon dictation along with smaller phrase technology. Any transcriptional errors that result from this process are unintentional.

## 2022-03-24 ENCOUNTER — Ambulatory Visit (INDEPENDENT_AMBULATORY_CARE_PROVIDER_SITE_OTHER): Payer: Commercial Managed Care - PPO

## 2022-03-24 ENCOUNTER — Ambulatory Visit: Payer: Self-pay

## 2022-03-24 ENCOUNTER — Encounter: Payer: Self-pay | Admitting: Family Medicine

## 2022-03-24 ENCOUNTER — Ambulatory Visit: Payer: Commercial Managed Care - PPO | Admitting: Family Medicine

## 2022-03-24 VITALS — BP 138/84 | HR 95 | Ht 68.0 in | Wt 183.0 lb

## 2022-03-24 DIAGNOSIS — M9902 Segmental and somatic dysfunction of thoracic region: Secondary | ICD-10-CM | POA: Diagnosis not present

## 2022-03-24 DIAGNOSIS — G8929 Other chronic pain: Secondary | ICD-10-CM

## 2022-03-24 DIAGNOSIS — M25511 Pain in right shoulder: Secondary | ICD-10-CM | POA: Insufficient documentation

## 2022-03-24 DIAGNOSIS — M9903 Segmental and somatic dysfunction of lumbar region: Secondary | ICD-10-CM

## 2022-03-24 DIAGNOSIS — S43431A Superior glenoid labrum lesion of right shoulder, initial encounter: Secondary | ICD-10-CM

## 2022-03-24 DIAGNOSIS — M9901 Segmental and somatic dysfunction of cervical region: Secondary | ICD-10-CM | POA: Diagnosis not present

## 2022-03-24 DIAGNOSIS — M9908 Segmental and somatic dysfunction of rib cage: Secondary | ICD-10-CM | POA: Diagnosis not present

## 2022-03-24 DIAGNOSIS — M9904 Segmental and somatic dysfunction of sacral region: Secondary | ICD-10-CM | POA: Diagnosis not present

## 2022-03-24 NOTE — Assessment & Plan Note (Signed)
Acute on chronic.  Discussed with patient with worsening symptoms.  Has had labral pathology previously.  Discussed with patient about icing regimen and home exercises, which activities to do and which ones to avoid.  Increase activity slowly.  Ultrasound did show some thickening of the capsule so could be early frozen shoulder as well.  Hopefully patient will respond well follow-up again in 6 to 8 weeks

## 2022-03-24 NOTE — Patient Instructions (Signed)
Good to see you   

## 2022-03-24 NOTE — Assessment & Plan Note (Signed)
Patient given injection and tolerated the procedure well, discussed icing regimen and home exercises, discussed which activities to do and which ones to avoid.  Increase activity slowly.  Follow-up again in 6 to 8 weeks

## 2022-04-02 ENCOUNTER — Other Ambulatory Visit: Payer: Self-pay

## 2022-04-03 ENCOUNTER — Other Ambulatory Visit: Payer: Self-pay

## 2022-04-03 MED ORDER — TRETINOIN 0.05 % EX CREA
1.0000 | TOPICAL_CREAM | Freq: Every evening | CUTANEOUS | 3 refills | Status: DC
  Filled 2022-04-03: qty 20, 30d supply, fill #0
  Filled 2022-07-21: qty 20, 30d supply, fill #1
  Filled 2022-10-26: qty 20, 30d supply, fill #2

## 2022-04-15 NOTE — Progress Notes (Signed)
[facility] [place] [address] [place] Phone: [phone] Subjective:   [name], am serving as a scribe for Dr. Hulan Saas.  I'm seeing this patient by the request  of:  Patient, No Pcp Per  CC: back and neck pain   [id number]  03/24/2022 Acute on chronic. Discussed with patient with worsening symptoms. Has had labral pathology previously. Discussed with patient about icing regimen and home exercises, which activities to do and which ones to avoid. Increase activity slowly. Ultrasound did show some thickening of the capsule so could be early frozen shoulder as well. Hopefully patient will respond well follow-up again in 6 to 8 weeks   Patient given injection and tolerated the procedure well, discussed icing regimen and home exercises, discussed which activities to do and which ones to avoid.  Increase activity slowly.  Follow-up again in 6 to 8 weeks      Update 04/21/2022 WILLA SADDLER is a 57 y.o. female coming in with complaint of back and neck pain. OMT 03/24/2022. Patient states doing well. Injection helped with pain, but still has limited ROM  especially abduction. No other concerns.  Medications patient has been prescribed: Meloxicam  Taking:         Reviewed prior external information including notes and imaging from previsou exam, outside providers and external EMR if available.   As well as notes that were available from care everywhere and other healthcare systems.  Past medical history, social, surgical and family history all reviewed in electronic medical record.  No pertanent information unless stated regarding to the chief complaint.   Past Medical History:  Diagnosis Date   Anemia    Wears contact lenses     Allergies  Allergen Reactions   Sulfa Antibiotics Diarrhea and Nausea And Vomiting     Review of Systems:  No headache, visual changes, nausea, vomiting, diarrhea, constipation, dizziness,  abdominal pain, skin rash, fevers, chills, night sweats, weight loss, swollen lymph nodes, body aches, joint swelling, chest pain, shortness of breath, mood changes. POSITIVE muscle aches  Objective  Blood pressure 128/82, pulse 90, height 5' 8"$  (1.727 m), weight 179 lb (81.2 kg), SpO2 98 %.   General: No apparent distress alert and oriented x3 mood and affect normal, dressed appropriately.  HEENT: Pupils equal, extraocular movements intact  Respiratory: Patient's speak in full sentences and does not appear short of breath  Cardiovascular: No lower extremity edema, non tender, no erythema  Neck exam does have loss of lordosis tightness with side bending  Negative spurling   Osteopathic findings  C2 flexed rotated and side bent right C5 flexed rotated and side bent left T3 extended rotated and side bent right inhaled rib T8 extended rotated and side bent left L2 flexed rotated and side bent right Sacrum right on right       Assessment and Plan:  DDD (degenerative disc disease), cervical DDD discussed which ones to avoid and which ones to avoid  Discussed helping with anxiety Watch ergonomics  F/u 6 weeks     Nonallopathic problems  Decision today to treat with OMT was based on Physical Exam  After verbal consent patient was treated with HVLA, ME, FPR techniques in cervical, rib, thoracic, lumbar, and sacral  areas  Patient tolerated the procedure well with improvement in symptoms  Patient given exercises, stretches and lifestyle modifications  See medications in patient instructions if given  Patient will follow up in 4-8 weeks  The above documentation has been reviewed and is accurate and complete [name], DO         Note: This dictation was prepared with Dragon dictation along with smaller phrase technology. Any transcriptional errors that result from this process are unintentional.

## 2022-04-21 ENCOUNTER — Ambulatory Visit: Payer: Commercial Managed Care - PPO | Admitting: Family Medicine

## 2022-04-21 ENCOUNTER — Other Ambulatory Visit: Payer: Self-pay

## 2022-04-21 ENCOUNTER — Encounter: Payer: Self-pay | Admitting: Family Medicine

## 2022-04-21 VITALS — BP 128/82 | HR 90 | Ht 68.0 in | Wt 179.0 lb

## 2022-04-21 DIAGNOSIS — M9904 Segmental and somatic dysfunction of sacral region: Secondary | ICD-10-CM

## 2022-04-21 DIAGNOSIS — M9903 Segmental and somatic dysfunction of lumbar region: Secondary | ICD-10-CM | POA: Diagnosis not present

## 2022-04-21 DIAGNOSIS — M9908 Segmental and somatic dysfunction of rib cage: Secondary | ICD-10-CM

## 2022-04-21 DIAGNOSIS — M9901 Segmental and somatic dysfunction of cervical region: Secondary | ICD-10-CM | POA: Diagnosis not present

## 2022-04-21 DIAGNOSIS — M9902 Segmental and somatic dysfunction of thoracic region: Secondary | ICD-10-CM | POA: Diagnosis not present

## 2022-04-21 DIAGNOSIS — M5030 Other cervical disc degeneration, unspecified cervical region: Secondary | ICD-10-CM

## 2022-04-21 NOTE — Assessment & Plan Note (Addendum)
DDD discussed which ones to avoid and which ones to avoid  Discussed helping with anxiety Watch ergonomics  F/u 6 weeks

## 2022-04-21 NOTE — Patient Instructions (Signed)
Sorry for running late Keep working on ROM See you again in 6 weeks

## 2022-04-23 ENCOUNTER — Other Ambulatory Visit: Payer: Self-pay

## 2022-04-23 MED ORDER — AMOXICILLIN 500 MG PO CAPS
500.0000 mg | ORAL_CAPSULE | Freq: Four times a day (QID) | ORAL | 0 refills | Status: DC
  Filled 2022-04-23: qty 20, 5d supply, fill #0

## 2022-05-01 DIAGNOSIS — H524 Presbyopia: Secondary | ICD-10-CM | POA: Diagnosis not present

## 2022-05-06 ENCOUNTER — Other Ambulatory Visit: Payer: Self-pay

## 2022-05-07 ENCOUNTER — Other Ambulatory Visit: Payer: Self-pay

## 2022-05-11 ENCOUNTER — Ambulatory Visit
Admission: EM | Admit: 2022-05-11 | Discharge: 2022-05-11 | Disposition: A | Discharge status: Home / Self Care | Payer: Commercial Managed Care - PPO | Attending: Emergency Medicine | Admitting: Emergency Medicine

## 2022-05-11 ENCOUNTER — Encounter: Payer: Self-pay | Admitting: Emergency Medicine

## 2022-05-11 DIAGNOSIS — J029 Acute pharyngitis, unspecified: Secondary | ICD-10-CM | POA: Insufficient documentation

## 2022-05-11 LAB — GROUP A STREP BY PCR: Group A Strep by PCR: NOT DETECTED

## 2022-05-11 MED ORDER — LIDOCAINE VISCOUS HCL 2 % MT SOLN
5.0000 mL | Freq: Four times a day (QID) | OROMUCOSAL | 0 refills | Status: DC | PRN
  Filled 2022-05-11: qty 360, 18d supply, fill #0

## 2022-05-11 NOTE — Discharge Instructions (Signed)
Strep test today is negative for bacteria to the throat  You may gargle and spit Magic mouthwash solution every 4-6 hours as needed to provide a temporary relief  You may take Tylenol 500 to 1000 mg every 6 hours and/or ibuprofen 600 to 800 mg every 6-8 hours as needed for comfort  May attempt salt water gargles, throat lozenges, warm fluids, soft liquids, teaspoons of honey as needed for additional comfort  He may follow-up with his urgent care as needed if symptoms persist or worsen

## 2022-05-11 NOTE — ED Provider Notes (Signed)
[facility]    CSN: [id number] Arrival date & time: 05/11/22  1833      History   Chief Complaint Chief Complaint  Patient presents with   Sore Throat    HPI Jenna [name] is a 57 y.o. female.   Patient presents for evaluation of a sore and swollen throat primarily to the right side beginning 2 days ago.  No known sick contacts.  Tolerating softer foods and warm liquids.  Has attempted use of Benadryl which has been minimally effective.  Denies fever, chills, body aches, ear pain, cough, congestion.  Was eating pistachios the night before symptoms began, unknown allergy.   Past Medical History:  Diagnosis Date   Anemia    Wears contact lenses     Patient Active Problem List   Diagnosis Date Noted   Pain in right acromioclavicular joint 03/24/2022   Right shoulder pain 03/24/2022   TFCC (triangular fibrocartilage complex) injury, left, initial encounter 09/29/2021   Lumbar trigger point syndrome 01/29/2021   SI (sacroiliac) joint dysfunction 07/21/2018   Nonallopathic lesion of sacral region 07/21/2018   Nonallopathic lesion of lumbosacral region 07/21/2018   Special screening for malignant neoplasms, colon    Benign neoplasm of transverse colon    Nonallopathic lesion of cervical region 10/20/2017   Nonallopathic lesion of thoracic region 10/20/2017   Nonallopathic lesion of rib cage 10/20/2017   Left rotator cuff tear 09/22/2017   Glenoid labral tear, right, initial encounter 09/22/2017   DDD (degenerative disc disease), cervical 09/22/2017    Past Surgical History:  Procedure Laterality Date   CERVICAL CERCLAGE     COLONOSCOPY WITH PROPOFOL N/A 05/10/2018   Procedure: COLONOSCOPY WITH PROPOFOL;  Surgeon: Virgel Manifold, MD;  Location: [place];  Service: Endoscopy;  Laterality: N/A;   POLYPECTOMY  05/10/2018   Procedure: POLYPECTOMY;  Surgeon: Virgel Manifold, MD;  Location: [place];  Service: Endoscopy;;    OB  History   No obstetric history on file.      Home Medications    Prior to Admission medications   Medication Sig Start Date End Date Taking? Authorizing Provider  Ferrous Sulfate (IRON) 325 (65 Fe) MG TABS Take by mouth.   Yes [provider]  amoxicillin (AMOXIL) 500 MG capsule Take 1 capsule (500 mg total) by mouth every 6 (six) hours until all medicine is gone. 11/13/21   [name]  amoxicillin (AMOXIL) 500 MG capsule Take 1 capsule (500 mg total) by mouth every 6 (six) hours until gone. 04/23/22     Ascorbic Acid (VITAMIN C PO) Take by mouth daily.    [provider]  benzonatate (TESSALON) 100 MG capsule Take 1 capsule (100 mg total) by mouth every six (6) hours as needed for cough. 07/19/20     chlorhexidine (PERIDEX) 0.12 % solution Swish and spit 15 mls as needed. To be used for reduction of bacteria, gum health, and some sensitivity. 11/13/21   [name]  clobetasol (TEMOVATE) 0.05 % external solution Apply once daily to affected areas at bedtime for 3-4 weeks. Then Monday, Wednesday and Friday. 08/15/21     clobetasol (TEMOVATE) 0.05 % external solution Apply once daily to affected areas at bedtime for 3-4 weeks. Then use Monday, Wednesday and Friday. 01/29/22     clobetasol (TEMOVATE) 0.05 % external solution Apply once daily to affected areas at bedtime for 3-4 weeks. Then Monday, Wednesday and Friday. 02/01/22     fluticasone (FLONASE) 50 MCG/ACT  nasal spray Place 2 sprays into both nostrils daily. 01/20/19   [name], MD  fluticasone (FLONASE) 50 MCG/ACT nasal spray 2 sprays into each nostril daily. 12/24/20     fluticasone (FLONASE) 50 MCG/ACT nasal spray 2 sprays into each nostril daily. 12/24/20     fluticasone (FLONASE) 50 MCG/ACT nasal spray Instill 2 sprays into each nostril daily. 02/17/22     ibuprofen (ADVIL) 600 MG tablet Take 1 tablet (600 mg total) by mouth every 6 (six) hours as needed. 01/20/19   [name], MD  ibuprofen (ADVIL)  600 MG tablet Take one tablet by mouth every 6 hours as needed for pain 10/13/21   [name]  Lifitegrast (XIIDRA) 5 % SOLN Instill 1 drop into both eyes twice a day 05/19/21     meloxicam (MOBIC) 15 MG tablet TAKE 1 TABLET BY MOUTH DAILY AS NEEDED 02/18/22 02/18/23  [name], DO  mupirocin ointment (BACTROBAN) 2 % Apply topically Three (3) times a day for 7 days. As needed 11/11/21     Nirmatrelvir & Ritonavir (PAXLOVID) 20 x 150 MG & 10 x '100MG'$  TBPK See package instructions. Fill by 07/21/20 at the latest. 07/19/20     OIL OF OREGANO PO Take by mouth daily.    [provider]  prednisoLONE acetate (PRED FORTE) 1 % ophthalmic suspension instill 1 drop by ophthalmic route 2 times daily into the LEFT eye until gone 12/16/20     promethazine (PHENERGAN) 25 MG tablet Take 1 tablet (25 mg total) by mouth every 6 (six) hours as needed for nausea or vomiting. 08/16/17   [name], DO  tretinoin (RETIN-A) 0.05 % cream Apply a pea size amount to affected area nightly. 08/15/21     tretinoin (RETIN-A) 0.05 % cream Apply 1 Application (pea size amount) topically Nightly. 04/03/22     Vitamin D, Ergocalciferol, (DRISDOL) 1.25 MG (50000 UNIT) CAPS capsule TAKE 1 CAPSULE BY MOUTH EVERY 7 DAYS. 01/28/22 01/28/23  [name], DO    Family History Family History  Problem Relation Age of Onset   Diabetes Other    Diabetes Father    Kidney disease Father    Heart attack Father     Social History Social History   Tobacco Use   Smoking status: Never   Smokeless tobacco: Never  Vaping Use   Vaping Use: Never used  Substance Use Topics   Alcohol use: Yes    Comment: socially - couple x/year   Drug use: Never     Allergies   Sulfa antibiotics   Review of Systems Review of Systems  Constitutional: Negative.   HENT:  Positive for sore throat. Negative for congestion, dental problem, drooling, ear discharge, ear pain, facial swelling, hearing loss, mouth sores, nosebleeds,  postnasal drip, rhinorrhea, sinus pressure, sinus pain, sneezing, tinnitus, trouble swallowing and voice change.   Respiratory: Negative.    Cardiovascular: Negative.   Gastrointestinal: Negative.   Skin: Negative.   Neurological: Negative.      Physical Exam Triage Vital Signs ED Triage Vitals  Enc Vitals Group     BP 05/11/22 1906 (!) 140/87     Pulse Rate 05/11/22 1906 76     Resp 05/11/22 1906 16     Temp 05/11/22 1906 98.4 F (36.9 C)     Temp Source 05/11/22 1906 Oral     SpO2 05/11/22 1906 100 %     Weight 05/11/22 1904 179 lb 0.2 oz (81.2 kg)  Height 05/11/22 1904 '5\' 8"'$  (1.727 m)     Head Circumference --      Peak Flow --      Pain Score 05/11/22 1903 6     Pain Loc --      Pain Edu? --      Excl. in [place]? --    No data found.  Updated Vital Signs BP (!) 140/87 (BP Location: Left Arm)   Pulse 76   Temp 98.4 F (36.9 C) (Oral)   Resp 16   Ht '5\' 8"'$  (1.727 m)   Wt 179 lb 0.2 oz (81.2 kg)   LMP  (LMP Unknown) Comment: IUD, denies preg  SpO2 100%   BMI 27.22 kg/m   Visual Acuity Right Eye Distance:   Left Eye Distance:   Bilateral Distance:    Right Eye Near:   Left Eye Near:    Bilateral Near:     Physical Exam Constitutional:      Appearance: She is well-developed.  HENT:     Head: Normocephalic.     Right Ear: Tympanic membrane and ear canal normal.     Left Ear: Tympanic membrane and ear canal normal.     Nose: Congestion and rhinorrhea present.     Mouth/Throat:     Mouth: Mucous membranes are moist.     Pharynx: Posterior oropharyngeal erythema present.     Tonsils: No tonsillar exudate. 0 on the left.  Cardiovascular:     Rate and Rhythm: Normal rate and regular rhythm.  Musculoskeletal:     Cervical back: Normal range of motion and neck supple.  Neurological:     General: No focal deficit present.     Mental Status: She is alert and oriented to person, place, and time.      UC Treatments / Results  Labs (all labs ordered are  listed, but only abnormal results are displayed) Labs Reviewed  GROUP A STREP BY PCR    EKG   Radiology No results found.  Procedures Procedures (including critical care time)  Medications Ordered in UC Medications - No data to display  Initial Impression / Assessment and Plan / UC Course  I have reviewed the triage vital signs and the nursing notes.  Pertinent labs & imaging results that were available during my care of the patient were reviewed by me and considered in my medical decision making (see chart for details).  Viral pharyngitis  Vital signs are stable and patient is in no signs of distress nontoxic-appearing, mild erythema without tonsillar adenopathy or exudate present on exam, strep PCR is negative, discussed with patient, prescribed Magic mouthwash and discussed supportive measures, may follow-up with urgent care as needed if symptoms persist or worsen Final Clinical Impressions(s) / UC Diagnoses   Final diagnoses:  None   Discharge Instructions   None    ED Prescriptions   None    PDMP not reviewed this encounter.   Hans Eden, NP 05/11/22 1955

## 2022-05-11 NOTE — ED Triage Notes (Signed)
Pt c/o sore and swollen throat. Started about 2 days ago. She states she ate some pistachios the night before. She states she took benadryl. Denies other symptoms.

## 2022-05-12 ENCOUNTER — Other Ambulatory Visit: Payer: Self-pay

## 2022-05-20 ENCOUNTER — Other Ambulatory Visit: Payer: Self-pay

## 2022-05-21 ENCOUNTER — Other Ambulatory Visit: Payer: Self-pay

## 2022-05-21 NOTE — Progress Notes (Signed)
  [place] [facility] [place] Phone: [phone] Subjective:   Jenna [name], am serving as a scribe for Dr. Hulan [name].  I'm seeing this patient by the request  of:  Patient, [name] Pcp Per  CC: Neck pain follow-up  [id number]  Jenna [name] is a 57 y.o. female coming in with complaint of back and neck pain. OMT 04/21/2022. Patient states that her neck pain has become more manageable. Continued weakness and pain in R scapula. Painful to sleep on that side. Patient uses meloxicam prn. Alt ice and heat. Denies any radiating symptoms.   Medications patient has been prescribed: Meloxicam, Vit D  Taking:         Reviewed prior external information including notes and imaging from previsou exam, outside providers and external EMR if available.   As well as notes that were available from care everywhere and other healthcare systems.  Past medical history, social, surgical and family history all reviewed in electronic medical record.  [name] unless stated regarding to the chief complaint.   Past Medical History:  Diagnosis Date   Anemia    Wears contact lenses     Allergies  Allergen Reactions   Sulfa Antibiotics Diarrhea and Nausea And Vomiting     Review of Systems:  [name] headache, visual changes, nausea, vomiting, diarrhea, constipation, dizziness, abdominal pain, skin rash, fevers, chills, night sweats, weight loss, swollen lymph nodes, body aches, joint swelling, chest pain, shortness of breath, mood changes. POSITIVE muscle aches  Objective  Blood pressure 118/88, pulse 83, height 5\' 8"  (1.727 m), weight 178 lb (80.7 kg), SpO2 98 %.   General: [name] mood and affect normal, dressed appropriately.  HEENT: Pupils equal, extraocular movements intact  Respiratory: Patient's speak in full sentences and does not appear short of breath  Cardiovascular: [name] lower  extremity edema, non tender, [name] erythema  Neck exam shows loss of lordosis tightness FABER   Osteopathic findings  C2 flexed rotated and side bent right C5 flexed rotated and side bent left T3 extended rotated and side bent right inhaled rib T9 extended rotated and side bent left L1 flexed rotated and side bent right Sacrum right on right     Assessment and Plan:  DDD (degenerative disc disease), cervical Continues to have degenerative disc disease.  Has had right shoulder pain previously as well.  Discussed with patient again at great length.  Discussed icing regimen and home exercises, discussed the potential for different injections.  Follow-up with me again in 6 to 8 weeks    Nonallopathic problems  Decision today to treat with OMT was based on Physical Exam  After verbal consent patient was treated with HVLA, ME, FPR techniques in cervical, rib, thoracic, lumbar, and sacral  areas  Patient tolerated the procedure well with improvement in symptoms  Patient given exercises, stretches and lifestyle modifications  See medications in patient instructions if given  Patient will follow up in 4-8 weeks    The above documentation has been reviewed and is accurate and complete Jenna Pulley, DO          Note: This dictation was prepared with Dragon dictation along with smaller phrase technology. Any transcriptional errors that result from this process are unintentional.

## 2022-05-28 ENCOUNTER — Other Ambulatory Visit: Payer: Self-pay | Admitting: Family Medicine

## 2022-05-28 ENCOUNTER — Other Ambulatory Visit: Payer: Self-pay

## 2022-05-29 ENCOUNTER — Other Ambulatory Visit: Payer: Self-pay

## 2022-06-01 ENCOUNTER — Other Ambulatory Visit: Payer: Self-pay

## 2022-06-01 MED FILL — Ergocalciferol Cap 1.25 MG (50000 Unit): ORAL | 84 days supply | Qty: 12 | Fill #0 | Status: AC

## 2022-06-02 ENCOUNTER — Other Ambulatory Visit: Payer: Self-pay

## 2022-06-03 ENCOUNTER — Encounter: Payer: Self-pay | Admitting: Family Medicine

## 2022-06-03 ENCOUNTER — Ambulatory Visit: Payer: Commercial Managed Care - PPO | Admitting: Family Medicine

## 2022-06-03 VITALS — BP 118/88 | HR 83 | Ht 68.0 in | Wt 178.0 lb

## 2022-06-03 DIAGNOSIS — M9908 Segmental and somatic dysfunction of rib cage: Secondary | ICD-10-CM | POA: Diagnosis not present

## 2022-06-03 DIAGNOSIS — M9902 Segmental and somatic dysfunction of thoracic region: Secondary | ICD-10-CM

## 2022-06-03 DIAGNOSIS — M9903 Segmental and somatic dysfunction of lumbar region: Secondary | ICD-10-CM

## 2022-06-03 DIAGNOSIS — M5030 Other cervical disc degeneration, unspecified cervical region: Secondary | ICD-10-CM

## 2022-06-03 DIAGNOSIS — M9904 Segmental and somatic dysfunction of sacral region: Secondary | ICD-10-CM

## 2022-06-03 DIAGNOSIS — M9901 Segmental and somatic dysfunction of cervical region: Secondary | ICD-10-CM | POA: Diagnosis not present

## 2022-06-03 NOTE — Patient Instructions (Addendum)
Enjoy the beach Keep working on posture Ice should before bed See you again in 6-8 weeks

## 2022-06-03 NOTE — Assessment & Plan Note (Signed)
Continues to have degenerative disc disease.  Has had right shoulder pain previously as well.  Discussed with patient again at great length.  Discussed icing regimen and home exercises, discussed the potential for different injections.  Follow-up with me again in 6 to 8 weeks

## 2022-06-15 ENCOUNTER — Other Ambulatory Visit: Payer: Self-pay

## 2022-06-15 MED ORDER — AMOXICILLIN 500 MG PO CAPS
ORAL_CAPSULE | ORAL | 0 refills | Status: DC
  Filled 2022-06-15: qty 21, 6d supply, fill #0

## 2022-07-07 ENCOUNTER — Other Ambulatory Visit: Payer: Self-pay

## 2022-07-07 ENCOUNTER — Other Ambulatory Visit: Payer: Self-pay | Admitting: Family Medicine

## 2022-07-07 MED ORDER — MELOXICAM 15 MG PO TABS
15.0000 mg | ORAL_TABLET | Freq: Every day | ORAL | 0 refills | Status: DC | PRN
  Filled 2022-07-07: qty 30, 30d supply, fill #0

## 2022-07-21 ENCOUNTER — Other Ambulatory Visit: Payer: Self-pay

## 2022-07-21 MED ORDER — XIIDRA 5 % OP SOLN
1.0000 [drp] | Freq: Two times a day (BID) | OPHTHALMIC | 3 refills | Status: DC
  Filled 2022-07-21: qty 60, 30d supply, fill #0
  Filled 2022-07-21 (×2): qty 180, 90d supply, fill #0
  Filled 2022-09-27 – 2022-10-06 (×2): qty 60, 30d supply, fill #1

## 2022-07-21 NOTE — Progress Notes (Unsigned)
[name] [address] [place] [address] Phone: [phone] Subjective:   Jenna [name], am serving as a scribe for Dr. Antoine Primas.  I'm seeing this patient by the request  of:  Patient, No Pcp Per  CC: Back and neck pain follow-up  [id number]  Jenna [name] is a 57 y.o. female coming in with complaint of back and neck pain. OMT 06/03/2022. Patient states she has been doing ok since last visit. No major issues with neck or back.   Medications patient has been prescribed: Vit D, Meloxicam  Taking:         Reviewed prior external information including notes and imaging from previsou exam, outside providers and external EMR if available.   As well as notes that were available from care everywhere and other healthcare systems.  Past medical history, social, surgical and family history all reviewed in electronic medical record.  No pertanent information unless stated regarding to the chief complaint.   Past Medical History:  Diagnosis Date   Anemia    Wears contact lenses     Allergies  Allergen Reactions   Sulfa Antibiotics Diarrhea and Nausea And Vomiting     Review of Systems:  No  visual changes, nausea, vomiting, diarrhea, constipation, dizziness,  skin rash, fevers, chills, night sweats, weight loss, swollen lymph nodes, body aches, joint swelling, chest pain, shortness of breath, mood changes. POSITIVE muscle aches, abdominal pain, headaches  Objective  Blood pressure 110/78, pulse 77, height 5\' 8"  (1.727 m), weight 176 lb (79.8 kg), SpO2 97 %.   General: No apparent distress alert and oriented x3 mood and affect normal, dressed appropriately.  HEENT: Pupils equal, extraocular movements intact  Respiratory: Patient's speak in full sentences and does not appear short of breath  Cardiovascular: No lower extremity edema, non tender, no erythema  Back exam does have some loss lordosis patient's neck exam does have  some limited sidebending bilaterally.  Significant tightness noted more on the right side of the occipital area than usual.  Osteopathic findings  C3 flexed rotated and side bent right C4 flexed rotated and side bent left C6 flexed rotated and side bent left T3 extended rotated and side bent right inhaled rib T7 extended rotated and side bent left T8 extended rotated and side bent right L2 flexed rotated and side bent right Sacrum right on right       Assessment and Plan:  DDD (degenerative disc disease), cervical Known degenerative disc disease.  Worsening symptoms secondary to more stress likely being part of it.  Discussed which activities to do and which ones to avoid.  Increase activity slowly over the course the next several weeks.  Discussed with patient that we if patient does get a new job we do need to consider the ergonomics and help with some of the accommodations.  Patient is in agreement with this.  Follow-up with me again 6 to 8 weeks.    Nonallopathic problems  Decision today to treat with OMT was based on Physical Exam  After verbal consent patient was treated with HVLA, ME, FPR techniques in cervical, rib, thoracic, lumbar, and sacral  areas  Patient tolerated the procedure well with improvement in symptoms  Patient given exercises, stretches and lifestyle modifications  See medications in patient instructions if given  Patient will follow up in 4-8 weeks    The above documentation has been reviewed and is accurate and complete Judi Saa, DO  Note: This dictation was prepared with Dragon dictation along with smaller phrase technology. Any transcriptional errors that result from this process are unintentional.

## 2022-07-22 ENCOUNTER — Encounter: Payer: Self-pay | Admitting: Family Medicine

## 2022-07-22 ENCOUNTER — Ambulatory Visit: Payer: Commercial Managed Care - PPO | Admitting: Family Medicine

## 2022-07-22 ENCOUNTER — Other Ambulatory Visit: Payer: Self-pay

## 2022-07-22 VITALS — BP 110/78 | HR 77 | Ht 68.0 in | Wt 176.0 lb

## 2022-07-22 DIAGNOSIS — M9908 Segmental and somatic dysfunction of rib cage: Secondary | ICD-10-CM | POA: Diagnosis not present

## 2022-07-22 DIAGNOSIS — M9903 Segmental and somatic dysfunction of lumbar region: Secondary | ICD-10-CM

## 2022-07-22 DIAGNOSIS — M9902 Segmental and somatic dysfunction of thoracic region: Secondary | ICD-10-CM

## 2022-07-22 DIAGNOSIS — M9901 Segmental and somatic dysfunction of cervical region: Secondary | ICD-10-CM | POA: Diagnosis not present

## 2022-07-22 DIAGNOSIS — M9904 Segmental and somatic dysfunction of sacral region: Secondary | ICD-10-CM

## 2022-07-22 DIAGNOSIS — M5030 Other cervical disc degeneration, unspecified cervical region: Secondary | ICD-10-CM | POA: Diagnosis not present

## 2022-07-22 NOTE — Patient Instructions (Signed)
Good to see you Sorry for everything going on If there is anything we can do let us know See me again in 8 weeks

## 2022-07-22 NOTE — Assessment & Plan Note (Signed)
Known degenerative disc disease.  Worsening symptoms secondary to more stress likely being part of it.  Discussed which activities to do and which ones to avoid.  Increase activity slowly over the course the next several weeks.  Discussed with patient that we if patient does get a new job we do need to consider the ergonomics and help with some of the accommodations.  Patient is in agreement with this.  Follow-up with me again 6 to 8 weeks.

## 2022-07-23 ENCOUNTER — Other Ambulatory Visit: Payer: Self-pay

## 2022-07-23 MED ORDER — CLOBETASOL PROPIONATE 0.05 % EX SOLN
Freq: Every day | CUTANEOUS | 1 refills | Status: DC
  Filled 2022-07-23: qty 50, 30d supply, fill #0
  Filled 2022-10-26: qty 50, 30d supply, fill #1

## 2022-07-28 ENCOUNTER — Other Ambulatory Visit: Payer: Self-pay

## 2022-07-28 DIAGNOSIS — Z124 Encounter for screening for malignant neoplasm of cervix: Secondary | ICD-10-CM | POA: Diagnosis not present

## 2022-07-28 DIAGNOSIS — N950 Postmenopausal bleeding: Secondary | ICD-10-CM | POA: Diagnosis not present

## 2022-07-31 ENCOUNTER — Other Ambulatory Visit: Payer: Self-pay

## 2022-07-31 DIAGNOSIS — N950 Postmenopausal bleeding: Secondary | ICD-10-CM

## 2022-08-04 ENCOUNTER — Ambulatory Visit
Admission: RE | Admit: 2022-08-04 | Discharge: 2022-08-04 | Disposition: A | Discharge status: Home / Self Care | Payer: Managed Care, Other (non HMO) | Source: Ambulatory Visit

## 2022-08-04 DIAGNOSIS — N950 Postmenopausal bleeding: Secondary | ICD-10-CM | POA: Diagnosis present

## 2022-08-06 DIAGNOSIS — E611 Iron deficiency: Secondary | ICD-10-CM | POA: Insufficient documentation

## 2022-08-06 DIAGNOSIS — E559 Vitamin D deficiency, unspecified: Secondary | ICD-10-CM | POA: Insufficient documentation

## 2022-09-15 ENCOUNTER — Other Ambulatory Visit: Payer: Self-pay

## 2022-09-15 ENCOUNTER — Other Ambulatory Visit: Payer: Self-pay | Admitting: Family Medicine

## 2022-09-15 MED ORDER — AZITHROMYCIN 250 MG PO TABS
ORAL_TABLET | ORAL | 0 refills | Status: AC
  Filled 2022-09-15: qty 6, 5d supply, fill #0

## 2022-09-16 ENCOUNTER — Other Ambulatory Visit: Payer: Self-pay

## 2022-09-16 MED FILL — Ergocalciferol Cap 1.25 MG (50000 Unit): ORAL | 84 days supply | Qty: 12 | Fill #0 | Status: AC

## 2022-09-17 ENCOUNTER — Other Ambulatory Visit: Payer: Self-pay

## 2022-09-17 ENCOUNTER — Ambulatory Visit: Payer: Commercial Managed Care - PPO | Admitting: Family Medicine

## 2022-09-18 ENCOUNTER — Other Ambulatory Visit: Payer: Self-pay

## 2022-09-22 ENCOUNTER — Other Ambulatory Visit: Payer: Self-pay

## 2022-09-27 ENCOUNTER — Other Ambulatory Visit: Payer: Self-pay

## 2022-09-28 ENCOUNTER — Other Ambulatory Visit: Payer: Self-pay

## 2022-10-01 NOTE — Progress Notes (Unsigned)
  [name] [address] [place] [address] Phone: [phone] Subjective:   Jenna [name], am serving as a scribe for Dr. Antoine Primas.  I'm seeing this patient by the request  of:  Patient, No Pcp Per  CC: back and neck pain   [id number]  Jenna [name] is a 57 y.o. female coming in with complaint of back and neck pain. OMT 07/22/2022. Patient states doing well. L wrist is in a bit of pain. No other concerns.  Medications patient has been prescribed: Meloxicam  Taking:         Reviewed prior external information including notes and imaging from previsou exam, outside providers and external EMR if available.   As well as notes that were available from care everywhere and other healthcare systems.  Past medical history, social, surgical and family history all reviewed in electronic medical record.  No pertanent information unless stated regarding to the chief complaint.   Past Medical History:  Diagnosis Date   Anemia    Wears contact lenses     Allergies  Allergen Reactions   Sulfa Antibiotics Diarrhea and Nausea And Vomiting     Review of Systems:  No headache, visual changes, nausea, vomiting, diarrhea, constipation, dizziness, abdominal pain, skin rash, fevers, chills, night sweats, weight loss, swollen lymph nodes, body aches, joint swelling, chest pain, shortness of breath, mood changes. POSITIVE muscle aches  Objective  There were no vitals taken for this visit.   General: No apparent distress alert and oriented x3 mood and affect normal, dressed appropriately.  HEENT: Pupils equal, extraocular movements intact  Respiratory: Patient's speak in full sentences and does not appear short of breath  Cardiovascular: No lower extremity edema, non tender, no erythema  Medical exam does have some loss of lordosis.  Some limited sidebending bilaterally. Left wrist exam does have some tenderness to palpation  diffusely.    Limited muscular skeletal ultrasound was performed and interpreted by Antoine Primas, M  Limited ultrasound shows hypoechoic changes of the TFCC noted.  Osteopathic findings  C4 flexed rotated and side bent left T3 extended rotated and side bent right inhaled rib T8 extended rotated and side bent left L3 flexed rotated and side bent right Sacrum right on right       Assessment and Plan:  No problem-specific Assessment & Plan notes found for this encounter.    Nonallopathic problems  Decision today to treat with OMT was based on Physical Exam  After verbal consent patient was treated with HVLA, ME, FPR techniques in cervical, rib, thoracic, lumbar, and sacral  areas  Patient tolerated the procedure well with improvement in symptoms  Patient given exercises, stretches and lifestyle modifications  See medications in patient instructions if given  Patient will follow up in 4-8 weeks    The above documentation has been reviewed and is accurate and complete Judi Saa, DO          Note: This dictation was prepared with Dragon dictation along with smaller phrase technology. Any transcriptional errors that result from this process are unintentional.

## 2022-10-06 ENCOUNTER — Other Ambulatory Visit: Payer: Self-pay

## 2022-10-06 ENCOUNTER — Encounter: Payer: Self-pay | Admitting: Family Medicine

## 2022-10-06 ENCOUNTER — Ambulatory Visit: Payer: Managed Care, Other (non HMO) | Admitting: Family Medicine

## 2022-10-06 VITALS — BP 116/76 | HR 82 | Ht 68.0 in | Wt 171.0 lb

## 2022-10-06 DIAGNOSIS — M9908 Segmental and somatic dysfunction of rib cage: Secondary | ICD-10-CM

## 2022-10-06 DIAGNOSIS — M25532 Pain in left wrist: Secondary | ICD-10-CM

## 2022-10-06 DIAGNOSIS — M5030 Other cervical disc degeneration, unspecified cervical region: Secondary | ICD-10-CM | POA: Diagnosis not present

## 2022-10-06 DIAGNOSIS — M9903 Segmental and somatic dysfunction of lumbar region: Secondary | ICD-10-CM

## 2022-10-06 DIAGNOSIS — M9904 Segmental and somatic dysfunction of sacral region: Secondary | ICD-10-CM

## 2022-10-06 DIAGNOSIS — M9901 Segmental and somatic dysfunction of cervical region: Secondary | ICD-10-CM | POA: Diagnosis not present

## 2022-10-06 DIAGNOSIS — S6982XA Other specified injuries of left wrist, hand and finger(s), initial encounter: Secondary | ICD-10-CM

## 2022-10-06 DIAGNOSIS — M9902 Segmental and somatic dysfunction of thoracic region: Secondary | ICD-10-CM

## 2022-10-06 MED ORDER — MELOXICAM 15 MG PO TABS
15.0000 mg | ORAL_TABLET | Freq: Every day | ORAL | 0 refills | Status: DC | PRN
  Filled 2022-10-06: qty 30, 30d supply, fill #0

## 2022-10-06 NOTE — Patient Instructions (Addendum)
Good to see you! Meloxicam or Ibuprofen 3x a day for 3 days Ice wrist Brace at night See you again in 6-8 weeks

## 2022-10-06 NOTE — Assessment & Plan Note (Signed)
Potential injections.  Patient declined.  Did discuss potential PRP.  Declined Raynaud's well.  Discussed potential advanced imaging which patient will consider occupational therapy and bracing previously.

## 2022-10-06 NOTE — Assessment & Plan Note (Signed)
Known degenerative disc disease.  Patient is having some increasing in neck pain and resolved.  Patient is working positional likely contributed to some of the aches and pains.  Follow-up again in 6 to 8 weeks we discussed medications including the muscle relaxers.  Follow-up again in 6 to 8 weeks

## 2022-10-07 ENCOUNTER — Other Ambulatory Visit: Payer: Self-pay

## 2022-10-07 MED ORDER — CHLORHEXIDINE GLUCONATE 0.12 % MT SOLN
15.0000 mL | Freq: Two times a day (BID) | OROMUCOSAL | 12 refills | Status: DC
  Filled 2022-10-07: qty 473, 16d supply, fill #0
  Filled 2022-10-26: qty 473, 16d supply, fill #1

## 2022-10-07 MED ORDER — AMOXICILLIN 500 MG PO CAPS
500.0000 mg | ORAL_CAPSULE | Freq: Four times a day (QID) | ORAL | 0 refills | Status: DC
  Filled 2022-10-07: qty 40, 10d supply, fill #0

## 2022-10-07 MED ORDER — NAPROXEN 250 MG PO TABS
500.0000 mg | ORAL_TABLET | ORAL | 0 refills | Status: DC
  Filled 2022-10-07: qty 16, 3d supply, fill #0

## 2022-10-26 ENCOUNTER — Other Ambulatory Visit: Payer: Self-pay

## 2022-10-27 ENCOUNTER — Other Ambulatory Visit: Payer: Self-pay

## 2022-10-28 ENCOUNTER — Other Ambulatory Visit: Payer: Self-pay

## 2022-10-28 MED ORDER — XIIDRA 5 % OP SOLN
1.0000 [drp] | Freq: Two times a day (BID) | OPHTHALMIC | 3 refills | Status: AC
  Filled 2022-10-28: qty 180, 90d supply, fill #0
  Filled 2023-01-05: qty 180, 90d supply, fill #1
  Filled 2023-07-07: qty 180, 90d supply, fill #2
  Filled 2023-10-06 – 2023-10-08 (×2): qty 180, 90d supply, fill #3

## 2022-10-30 ENCOUNTER — Other Ambulatory Visit: Payer: Self-pay

## 2022-11-18 ENCOUNTER — Ambulatory Visit: Payer: Managed Care, Other (non HMO) | Admitting: Family Medicine

## 2022-11-26 ENCOUNTER — Encounter: Payer: Self-pay | Admitting: Family Medicine

## 2022-12-16 NOTE — Progress Notes (Signed)
[name] [address] [place] [address] Phone: [phone] Subjective:   Jenna [name], am serving as a scribe for Dr. Antoine Primas.  I'm seeing this patient by the request  of:  Patient, No Pcp Per  CC: back and neck pain follow up   [id number]  10/06/2022 Potential injections.  Patient declined.  Did discuss potential PRP.  Declined Raynaud's well.  Discussed potential advanced imaging which patient will consider occupational therapy and bracing previously.     Update 12/17/2022 SANTOYA [name] is a 57 y.o. female coming in with complaint of back and neck pain. OMT 10/06/2022. L wrist pain. Patient states that she continues to have R posterior shoulder pain. Pain worse when she sleeps on contralateral side. Otherwise doing well as she has lost 11 pounds.   L wrist pain is intermittent. Worse with weight bearing.   Medications patient has been prescribed:   Taking:         Reviewed prior external information including notes and imaging from previsou exam, outside providers and external EMR if available.   As well as notes that were available from care everywhere and other healthcare systems.  Past medical history, social, surgical and family history all reviewed in electronic medical record.  No pertanent information unless stated regarding to the chief complaint.   Past Medical History:  Diagnosis Date   Anemia    Wears contact lenses     Allergies  Allergen Reactions   Sulfa Antibiotics Diarrhea and Nausea And Vomiting     Review of Systems:  No headache, visual changes, nausea, vomiting, diarrhea, constipation, dizziness, abdominal pain, skin rash, fevers, chills, night sweats, weight loss, swollen lymph nodes, body aches, joint swelling, chest pain, shortness of breath, mood changes. POSITIVE muscle aches  Objective  Blood pressure 122/78, pulse 86, height 5\' 8"  (1.727 m), weight 162 lb (73.5 kg), SpO2 98%.    General: No apparent distress alert and oriented x3 mood and affect normal, dressed appropriately.  HEENT: Pupils equal, extraocular movements intact  Respiratory: Patient's speak in full sentences and does not appear short of breath  Cardiovascular: No lower extremity edema, non tender, no erythema  Neck exam does have some mild loss lordosis noted.  Some tenderness to palpation in the paraspinal musculature.  Osteopathic findings  C2 flexed rotated and side bent right C5 flexed rotated and side bent left T3 extended rotated and side bent right inhaled rib T9 extended rotated and side bent left L2 flexed rotated and side bent right Sacrum right on right       Assessment and Plan:  DDD (degenerative disc disease), cervical Known degenerative disease.  Does have some tightness noted.  Asked to avoid certain activities.  Increase activity slowly.  Follow-up again in 6 to 8 weeks    Nonallopathic problems  Decision today to treat with OMT was based on Physical Exam  After verbal consent patient was treated with HVLA, ME, FPR techniques in cervical, rib, thoracic, lumbar, and sacral  areas  Patient tolerated the procedure well with improvement in symptoms  Patient given exercises, stretches and lifestyle modifications  See medications in patient instructions if given  Patient will follow up in 4-8 weeks     The above documentation has been reviewed and is accurate and complete Judi Saa, DO         Note: This dictation was prepared with Dragon dictation along with smaller phrase technology. Any transcriptional errors that result  from this process are unintentional.

## 2022-12-18 ENCOUNTER — Encounter: Payer: Self-pay | Admitting: Family Medicine

## 2022-12-18 ENCOUNTER — Ambulatory Visit (INDEPENDENT_AMBULATORY_CARE_PROVIDER_SITE_OTHER): Payer: Managed Care, Other (non HMO) | Admitting: Family Medicine

## 2022-12-18 VITALS — BP 122/78 | HR 86 | Ht 68.0 in | Wt 162.0 lb

## 2022-12-18 DIAGNOSIS — M9901 Segmental and somatic dysfunction of cervical region: Secondary | ICD-10-CM | POA: Diagnosis not present

## 2022-12-18 DIAGNOSIS — M9903 Segmental and somatic dysfunction of lumbar region: Secondary | ICD-10-CM

## 2022-12-18 DIAGNOSIS — M9908 Segmental and somatic dysfunction of rib cage: Secondary | ICD-10-CM | POA: Diagnosis not present

## 2022-12-18 DIAGNOSIS — M5030 Other cervical disc degeneration, unspecified cervical region: Secondary | ICD-10-CM | POA: Diagnosis not present

## 2022-12-18 DIAGNOSIS — M9904 Segmental and somatic dysfunction of sacral region: Secondary | ICD-10-CM

## 2022-12-18 DIAGNOSIS — M9902 Segmental and somatic dysfunction of thoracic region: Secondary | ICD-10-CM

## 2022-12-18 NOTE — Assessment & Plan Note (Signed)
Known degenerative disease.  Does have some tightness noted.  Asked to avoid certain activities.  Increase activity slowly.  Follow-up again in 6 to 8 weeks

## 2022-12-18 NOTE — Patient Instructions (Signed)
Great to see you Be proud See me again in 6-8 weeks

## 2023-01-07 ENCOUNTER — Other Ambulatory Visit: Payer: Self-pay

## 2023-01-15 ENCOUNTER — Ambulatory Visit
Admission: EM | Admit: 2023-01-15 | Discharge: 2023-01-15 | Disposition: A | Discharge status: Home / Self Care | Payer: Managed Care, Other (non HMO) | Attending: Family Medicine | Admitting: Family Medicine

## 2023-01-15 ENCOUNTER — Encounter: Payer: Self-pay | Admitting: Emergency Medicine

## 2023-01-15 DIAGNOSIS — J069 Acute upper respiratory infection, unspecified: Secondary | ICD-10-CM | POA: Diagnosis present

## 2023-01-15 DIAGNOSIS — J0120 Acute ethmoidal sinusitis, unspecified: Secondary | ICD-10-CM | POA: Insufficient documentation

## 2023-01-15 LAB — SARS CORONAVIRUS 2 BY RT PCR: SARS Coronavirus 2 by RT PCR: NEGATIVE

## 2023-01-15 MED ORDER — IPRATROPIUM BROMIDE 0.06 % NA SOLN
2.0000 | Freq: Four times a day (QID) | NASAL | 12 refills | Status: AC
Start: 2023-01-15 — End: ?

## 2023-01-15 MED ORDER — PREDNISONE 10 MG (21) PO TBPK: ORAL_TABLET | Freq: Every day | ORAL | 0 refills | Status: DC

## 2023-01-15 MED ORDER — AZITHROMYCIN 250 MG PO TABS: ORAL_TABLET | ORAL | 0 refills | Status: DC

## 2023-01-15 NOTE — ED Triage Notes (Signed)
Patient c/o sinus pain and pressure, nasal congestion and headache for the past 3 days.  Patient denies fevers.

## 2023-01-15 NOTE — Discharge Instructions (Signed)
Your COVID test is negative.  Stop by the pharmacy to pick up your prescriptions.  Take the antibiotics if your symptoms do not improve after 7 days or if you develop dental pain or a fever.

## 2023-01-15 NOTE — ED Provider Notes (Signed)
[facility]    CSN: [id number] Arrival date & time: 01/15/23  1457      History   Chief Complaint Chief Complaint  Patient presents with   Nasal Congestion   Sinus Problem    HPI Jenna [name] is a 57 y.o. female.   HPI  History obtained from the patient. Jenna [name] presents for sinus pain, pressure and nasal congestion with headache for the past 3 days. No known fever or headache. Taking Mucinex, sinus medication and allergy medication. Had slight cough. Denies vomiting, diarrhea and headache. No known sick contacts.   Has chronic allergies and has been rotating Claritin and Allergra with her daily Flonase. She is traveling soon and wants to be sure she is well.       Past Medical History:  Diagnosis Date   Anemia    Wears contact lenses     Patient Active Problem List   Diagnosis Date Noted   Pain in right acromioclavicular joint 03/24/2022   Right shoulder pain 03/24/2022   TFCC (triangular fibrocartilage complex) injury, left, initial encounter 09/29/2021   Lumbar trigger point syndrome 01/29/2021   SI (sacroiliac) joint dysfunction 07/21/2018   Nonallopathic lesion of sacral region 07/21/2018   Nonallopathic lesion of lumbosacral region 07/21/2018   Special screening for malignant neoplasms, colon    Benign neoplasm of transverse colon    Nonallopathic lesion of cervical region 10/20/2017   Nonallopathic lesion of thoracic region 10/20/2017   Nonallopathic lesion of rib cage 10/20/2017   Left rotator cuff tear 09/22/2017   Glenoid labral tear, right, initial encounter 09/22/2017   DDD (degenerative disc disease), cervical 09/22/2017    Past Surgical History:  Procedure Laterality Date   CERVICAL CERCLAGE     COLONOSCOPY WITH PROPOFOL N/A 05/10/2018   Procedure: COLONOSCOPY WITH PROPOFOL;  Surgeon: Pasty Spillers, MD;  Location: [facility];  Service: Endoscopy;  Laterality: N/A;   POLYPECTOMY  05/10/2018   Procedure:  POLYPECTOMY;  Surgeon: Pasty Spillers, MD;  Location: [facility];  Service: Endoscopy;;    OB History   No obstetric history on file.      Home Medications    Prior to Admission medications   Medication Sig Start Date End Date Taking? Authorizing Provider  azithromycin (ZITHROMAX Z-PAK) 250 MG tablet Take 2 tablets on day 1 then 1 tablet daily 01/15/23  Yes Aviona Martenson, DO  Ferrous Sulfate (IRON) 325 (65 Fe) MG TABS Take by mouth.   Yes [provider]  ipratropium (ATROVENT) 0.06 % nasal spray Place 2 sprays into both nostrils 4 (four) times daily. 01/15/23  Yes Clyda Smyth, DO  predniSONE (STERAPRED UNI-PAK 21 TAB) 10 MG (21) TBPK tablet Take by mouth daily. Take 6 tabs by mouth daily for 1, then 5 tabs for 1 day, then 4 tabs for 1 day, then 3 tabs for 1 day, then 2 tabs for 1 day, then 1 tab for 1 day. 01/15/23  Yes Remmy Crass, Seward Meth, DO  Vitamin D, Ergocalciferol, (DRISDOL) 1.25 MG (50000 UNIT) CAPS capsule Take 1 capsule (50,000 Units total) by mouth every 7 (seven) days. 09/16/22 09/16/23 Yes Judi Saa, DO  amoxicillin (AMOXIL) 500 MG capsule Take 1 capsule (500 mg total) by mouth 4 (four) times daily until gone beginning the morning of the surgery. 10/07/22     Ascorbic Acid (VITAMIN C PO) Take by mouth daily.    [provider]  chlorhexidine (PERIDEX) 0.12 % solution Rinse with 15 mLs by Mouth  Rinse route 2 (two) times daily and spit out. 10/07/22     clobetasol (TEMOVATE) 0.05 % external solution Apply once daily to affected areas at bedtime for 3-4 weeks. Then use Monday, Wednesday and Friday. 01/29/22     clobetasol (TEMOVATE) 0.05 % external solution Apply topically once daily to affected areas at bedtime for 3-4 weeks. Then Monday, Wednesday and Friday. 07/23/22     fluticasone (FLONASE) 50 MCG/ACT nasal spray Instill 2 sprays into each nostril daily. 02/17/22     Lifitegrast (XIIDRA) 5 % SOLN Place 1 drop into both eyes 2 (two) times  daily. 10/28/22     meloxicam (MOBIC) 15 MG tablet Take 1 tablet (15 mg total) by mouth daily as needed. 10/06/22 10/06/23  Judi Saa, DO  mupirocin ointment (BACTROBAN) 2 % Apply topically Three (3) times a day for 7 days. As needed 11/11/21     naproxen (NAPROSYN) 250 MG tablet Take 2 tablets (500 mg total) by mouth morning of procedure and 1 tablet (250 mg total) by mouth three times daily as needed for pain. Do not exceed 5 tablets in a 24 hour period. 10/07/22     OIL OF OREGANO PO Take by mouth daily.    [provider]  tretinoin (RETIN-A) 0.05 % cream Apply 1 Application (pea size amount) topically Nightly. 04/03/22       Family History Family History  Problem Relation Age of Onset   Diabetes Other    Diabetes Father    Kidney disease Father    Heart attack Father     Social History Social History   Tobacco Use   Smoking status: Never   Smokeless tobacco: Never  Vaping Use   Vaping status: Never Used  Substance Use Topics   Alcohol use: Yes    Comment: socially - couple x/year   Drug use: Never     Allergies   Sulfa antibiotics   Review of Systems Review of Systems: negative unless otherwise stated in HPI.      Physical Exam Triage Vital Signs ED Triage Vitals  Encounter Vitals Group     BP 01/15/23 1534 137/76     Systolic BP Percentile --      Diastolic BP Percentile --      Pulse Rate 01/15/23 1534 86     Resp 01/15/23 1534 14     Temp 01/15/23 1534 98.5 F (36.9 C)     Temp Source 01/15/23 1534 Oral     SpO2 01/15/23 1534 100 %     Weight 01/15/23 1532 162 lb 0.6 oz (73.5 kg)     Height 01/15/23 1532 5\' 8"  (1.727 m)     Head Circumference --      Peak Flow --      Pain Score 01/15/23 1532 3     Pain Loc --      Pain Education --      Exclude from Growth Chart --    No data found.  Updated Vital Signs BP 137/76 (BP Location: Left Arm)   Pulse 86   Temp 98.5 F (36.9 C) (Oral)   Resp 14   Ht 5\' 8"  (1.727 m)   Wt 73.5 kg   LMP   (LMP Unknown) Comment: IUD, denies preg  SpO2 100%   BMI 24.64 kg/m   Visual Acuity Right Eye Distance:   Left Eye Distance:   Bilateral Distance:    Right Eye Near:   Left Eye Near:    Bilateral  Near:     Physical Exam GEN:     alert, non-toxic appearing female in no distress    HENT:  mucus membranes moist, oropharyngeal without lesions or erythema, no tonsillar hypertrophy or exudates,  moderate erythematous edematous turbinates, clear nasal discharge, bilateral TM normal, ethmoid sinus tenderness  EYES:   pupils equal and reactive, no scleral injection or discharge NECK:  normal ROM, no meningismus   RESP:  no increased work of breathing Skin:   warm and dry    UC Treatments / Results  Labs (all labs ordered are listed, but only abnormal results are displayed) Labs Reviewed  SARS CORONAVIRUS 2 BY RT PCR    EKG   Radiology No results found.  Procedures Procedures (including critical care time)  Medications Ordered in UC Medications - No data to display  Initial Impression / Assessment and Plan / UC Course  I have reviewed the triage vital signs and the nursing notes.  Pertinent labs & imaging results that were available during my care of the patient were reviewed by me and considered in my medical decision making (see chart for details).       Pt is a 57 y.o. female who presents for 3 days of respiratory symptoms. Siddalee is afebrile here without recent antipyretics. Satting well on room air. Overall pt is non-toxic appearing, well hydrated, without respiratory distress.  COVID testing obtained and was negative. She has ethmoid sinus tenderness concerning for possible acute sinusitis.  Additionally, has chronic rhinitis. Treat with Atrovent nasal spray and steroids.  As she is traveling soon, antibiotics were prescribed to be used if not improving by day 10 of symptoms.  Discussed symptomatic treatment. Typical duration of symptoms discussed.   Return and ED  precautions given and voiced understanding. Discussed MDM, treatment plan and plan for follow-up with patient who agrees with plan.     Final Clinical Impressions(s) / UC Diagnoses   Final diagnoses:  Upper respiratory tract infection, unspecified type  Acute non-recurrent ethmoidal sinusitis     Discharge Instructions      Your COVID test is negative.  Stop by the pharmacy to pick up your prescriptions.  Take the antibiotics if your symptoms do not improve after 7 days or if you develop dental pain or a fever.       ED Prescriptions     Medication Sig Dispense Auth. Provider   predniSONE (STERAPRED UNI-PAK 21 TAB) 10 MG (21) TBPK tablet Take by mouth daily. Take 6 tabs by mouth daily for 1, then 5 tabs for 1 day, then 4 tabs for 1 day, then 3 tabs for 1 day, then 2 tabs for 1 day, then 1 tab for 1 day. 21 tablet Anaalicia Reimann, DO   ipratropium (ATROVENT) 0.06 % nasal spray Place 2 sprays into both nostrils 4 (four) times daily. 15 mL Scarlettrose Costilow, DO   azithromycin (ZITHROMAX Z-PAK) 250 MG tablet Take 2 tablets on day 1 then 1 tablet daily 6 tablet Errin Whitelaw, DO      PDMP not reviewed this encounter.   Katha Cabal, DO 01/15/23 1633

## 2023-02-05 NOTE — Progress Notes (Unsigned)
[name] [address] [place] [address] Phone: [phone] Subjective:   INadine Counts, am serving as a scribe for Dr. Antoine Primas.  I'm seeing this patient by the request  of:  Patient, No Pcp Per  CC: Back and neck pain follow-up  [id number]  Jenna [name] is a 57 y.o. female coming in with complaint of back and neck pain. OMT 12/18/2022. Patient states same per usual.  No new concerns.  Continues to have discomfort and pain though.  Mostly seems to be more of the neck than the lower back at the moment.  Medications patient has been prescribed: Meloxicam, Vit D  Taking:         Reviewed prior external information including notes and imaging from previsou exam, outside providers and external EMR if available.   As well as notes that were available from care everywhere and other healthcare systems.  Past medical history, social, surgical and family history all reviewed in electronic medical record.  No pertanent information unless stated regarding to the chief complaint.   Past Medical History:  Diagnosis Date   Anemia    Wears contact lenses     Allergies  Allergen Reactions   Sulfa Antibiotics Diarrhea and Nausea And Vomiting     Review of Systems:  No headache, visual changes, nausea, vomiting, diarrhea, constipation, dizziness, abdominal pain, skin rash, fevers, chills, night sweats, weight loss, swollen lymph nodes, body aches, joint swelling, chest pain, shortness of breath, mood changes. POSITIVE muscle aches  Objective  Blood pressure 104/70, pulse 95, height 5\' 8"  (1.727 m), weight 168 lb (76.2 kg), SpO2 97%.   General: No apparent distress alert and oriented x3 mood and affect normal, dressed appropriately.  HEENT: Pupils equal, extraocular movements intact  Respiratory: Patient's speak in full sentences and does not appear short of breath  Cardiovascular: No lower extremity edema, non tender, no erythema   Back does have a loss of lordosis noted.  Some tenderness to palpation in the paraspinal musculature.  Osteopathic findings  C2 flexed rotated and side bent right C6 flexed rotated and side bent left C7 flexed rotated and side bent right T3 extended rotated and side bent right inhaled rib T8 extended rotated and side bent right with inhaled rib L2 flexed rotated and side bent right Sacrum right on right       Assessment and Plan:  DDD (degenerative disc disease), cervical Neck exam does have some loss of lordosis.  Some limited sidebending bilaterally.  Does have some crepitus noted.  Will continue to monitor.  Does respond well to osteopathic manipulation.  Follow-up again in 6 to 8 weeks.  Pain in right acromioclavicular joint Discussed with patient about repeating the injection.  Patient wants to hold at this point.  Increase activity slowly otherwise.    Nonallopathic problems  Decision today to treat with OMT was based on Physical Exam  After verbal consent patient was treated with HVLA, ME, FPR techniques in cervical, rib, thoracic, lumbar, and sacral  areas  Patient tolerated the procedure well with improvement in symptoms  Patient given exercises, stretches and lifestyle modifications  See medications in patient instructions if given  Patient will follow up in 4-8 weeks     The above documentation has been reviewed and is accurate and complete Judi Saa, DO         Note: This dictation was prepared with Dragon dictation along with smaller phrase technology. Any transcriptional errors  that result from this process are unintentional.

## 2023-02-09 ENCOUNTER — Ambulatory Visit: Payer: Managed Care, Other (non HMO) | Admitting: Family Medicine

## 2023-02-10 ENCOUNTER — Ambulatory Visit: Payer: Managed Care, Other (non HMO) | Admitting: Family Medicine

## 2023-02-10 ENCOUNTER — Encounter: Payer: Self-pay | Admitting: Family Medicine

## 2023-02-10 VITALS — BP 104/70 | HR 95 | Ht 68.0 in | Wt 168.0 lb

## 2023-02-10 DIAGNOSIS — M5030 Other cervical disc degeneration, unspecified cervical region: Secondary | ICD-10-CM

## 2023-02-10 DIAGNOSIS — M9904 Segmental and somatic dysfunction of sacral region: Secondary | ICD-10-CM

## 2023-02-10 DIAGNOSIS — M9901 Segmental and somatic dysfunction of cervical region: Secondary | ICD-10-CM

## 2023-02-10 DIAGNOSIS — M25511 Pain in right shoulder: Secondary | ICD-10-CM

## 2023-02-10 DIAGNOSIS — M9902 Segmental and somatic dysfunction of thoracic region: Secondary | ICD-10-CM | POA: Diagnosis not present

## 2023-02-10 DIAGNOSIS — M9903 Segmental and somatic dysfunction of lumbar region: Secondary | ICD-10-CM

## 2023-02-10 DIAGNOSIS — M9908 Segmental and somatic dysfunction of rib cage: Secondary | ICD-10-CM

## 2023-02-10 NOTE — Assessment & Plan Note (Signed)
Discussed with patient about repeating the injection.  Patient wants to hold at this point.  Increase activity slowly otherwise.

## 2023-02-10 NOTE — Assessment & Plan Note (Signed)
Neck exam does have some loss of lordosis.  Some limited sidebending bilaterally.  Does have some crepitus noted.  Will continue to monitor.  Does respond well to osteopathic manipulation.  Follow-up again in 6 to 8 weeks.

## 2023-02-10 NOTE — Patient Instructions (Signed)
Happy Holidays See you again in 6-8 weeks

## 2023-03-23 ENCOUNTER — Other Ambulatory Visit: Payer: Self-pay

## 2023-03-24 ENCOUNTER — Other Ambulatory Visit: Payer: Self-pay

## 2023-03-25 ENCOUNTER — Other Ambulatory Visit: Payer: Self-pay

## 2023-03-26 ENCOUNTER — Other Ambulatory Visit: Payer: Self-pay

## 2023-03-26 NOTE — Progress Notes (Unsigned)
  [name] [address] [place] [address] Phone: [phone] Subjective:   INadine Counts, am serving as a scribe for Dr. Antoine Primas.  I'm seeing this patient by the request  of:  Patient, No Pcp Per  CC: Back and neck pain follow-up  [id number]  Jenna [name] is a 58 y.o. female coming in with complaint of back and neck pain. OMT 02/10/2023. Patient states same per usual. L SI bothersome. Lifting and even light weight hurt over time.  Medications patient has been prescribed: Meloxicam, Vit D  Taking:      Reviewed prior external information including notes and imaging from previsou exam, outside providers and external EMR if available.   As well as notes that were available from care everywhere and other healthcare systems.  Past medical history, social, surgical and family history all reviewed in electronic medical record.  No pertanent information unless stated regarding to the chief complaint.   Past Medical History:  Diagnosis Date   Anemia    Wears contact lenses     Allergies  Allergen Reactions   Sulfa Antibiotics Diarrhea and Nausea And Vomiting     Review of Systems:  No headache, visual changes, nausea, vomiting, diarrhea, constipation, dizziness, abdominal pain, skin rash, fevers, chills, night sweats, weight loss, swollen lymph nodes, body aches, joint swelling, chest pain, shortness of breath, mood changes. POSITIVE muscle aches  Objective  Blood pressure 122/74, pulse 90, height 5\' 8"  (1.727 m), weight 160 lb (72.6 kg), SpO2 98%.   General: No apparent distress alert and oriented x3 mood and affect normal, dressed appropriately.  HEENT: Pupils equal, extraocular movements intact  Respiratory: Patient's speak in full sentences and does not appear short of breath  Cardiovascular: No lower extremity edema, non tender, no erythema  Gait normal  MSK:  Back some loss lordosis noted.  Some improvement in core  strength.  Patient though does have significant tightness of the neck noted.  Positive sidebending noted that gives some difficulty.  Osteopathic findings  C2 flexed rotated and side bent right C5 flexed rotated and side bent left T3 extended rotated and side bent right inhaled rib T5 extended rotated and side bent left L2 flexed rotated and side bent right Sacrum right on right    Assessment and Plan:  DDD (degenerative disc disease), cervical Degenerative disc disease.  Discussed exercises, which ones to wait.  Increase activity slowly.  Discussed icing regimen home exercises.  Follow-up again to 6 to 8 weeks.    Nonallopathic problems  Decision today to treat with OMT was based on Physical Exam  After verbal consent patient was treated with HVLA, ME, FPR techniques in cervical, rib, thoracic, lumbar, and sacral  areas  Patient tolerated the procedure well with improvement in symptoms  Patient given exercises, stretches and lifestyle modifications  See medications in patient instructions if given  Patient will follow up in 4-8 weeks     The above documentation has been reviewed and is accurate and complete Judi Saa, DO         Note: This dictation was prepared with Dragon dictation along with smaller phrase technology. Any transcriptional errors that result from this process are unintentional.

## 2023-03-28 ENCOUNTER — Other Ambulatory Visit: Payer: Self-pay

## 2023-03-30 ENCOUNTER — Other Ambulatory Visit: Payer: Self-pay

## 2023-03-30 ENCOUNTER — Other Ambulatory Visit: Payer: Self-pay | Admitting: *Deleted

## 2023-03-30 ENCOUNTER — Encounter: Payer: Self-pay | Admitting: Family Medicine

## 2023-03-30 ENCOUNTER — Telehealth: Payer: Self-pay

## 2023-03-30 ENCOUNTER — Ambulatory Visit: Payer: Managed Care, Other (non HMO) | Admitting: Family Medicine

## 2023-03-30 ENCOUNTER — Telehealth: Payer: Self-pay | Admitting: *Deleted

## 2023-03-30 VITALS — BP 122/74 | HR 90 | Ht 68.0 in | Wt 160.0 lb

## 2023-03-30 DIAGNOSIS — M9908 Segmental and somatic dysfunction of rib cage: Secondary | ICD-10-CM

## 2023-03-30 DIAGNOSIS — M5030 Other cervical disc degeneration, unspecified cervical region: Secondary | ICD-10-CM

## 2023-03-30 DIAGNOSIS — M9902 Segmental and somatic dysfunction of thoracic region: Secondary | ICD-10-CM

## 2023-03-30 DIAGNOSIS — Z860100 Personal history of colon polyps, unspecified: Secondary | ICD-10-CM

## 2023-03-30 DIAGNOSIS — M9904 Segmental and somatic dysfunction of sacral region: Secondary | ICD-10-CM | POA: Diagnosis not present

## 2023-03-30 DIAGNOSIS — M9901 Segmental and somatic dysfunction of cervical region: Secondary | ICD-10-CM | POA: Diagnosis not present

## 2023-03-30 DIAGNOSIS — M9903 Segmental and somatic dysfunction of lumbar region: Secondary | ICD-10-CM

## 2023-03-30 MED ORDER — VITAMIN D (ERGOCALCIFEROL) 1.25 MG (50000 UNIT) PO CAPS: 50000.0000 [IU] | ORAL_CAPSULE | ORAL | 0 refills | Status: DC

## 2023-03-30 MED ORDER — MELOXICAM 15 MG PO TABS: 15.0000 mg | ORAL_TABLET | Freq: Every day | ORAL | 0 refills | Status: DC | PRN

## 2023-03-30 MED ORDER — NA SULFATE-K SULFATE-MG SULF 17.5-3.13-1.6 GM/177ML PO SOLN
1.0000 | Freq: Once | ORAL | 0 refills | Status: AC
Start: 2023-03-30 — End: 2023-03-30

## 2023-03-30 NOTE — Assessment & Plan Note (Addendum)
Degenerative disc disease.  Discussed exercises, which ones to wait.  Increase activity slowly.  Discussed icing regimen home exercises.  Follow-up again to 6 to 8 weeks.  Patient has had some stress as well.  Discussed which activities to do and which ones to read.  Again 6 to 8 weeks discussed ergonomics that could be beneficial.

## 2023-03-30 NOTE — Telephone Encounter (Signed)
Gastroenterology Pre-Procedure Review  Request Date: 05/11/2023 Requesting Physician: Dr. Allegra Lai  PATIENT REVIEW QUESTIONS: The patient responded to the following health history questions as indicated:    1. Are you having any GI issues? no 2. Do you have a personal history of Polyps? yes (last colonoscopy on 05/10/2018 with Dr Maximino Greenland ) 3. Do you have a family history of Colon Cancer or Polyps? no 4. Diabetes Mellitus? no 5. Joint replacements in the past 12 months?no 6. Major health problems in the past 3 months?no 7. Any artificial heart valves, MVP, or defibrillator?no    MEDICATIONS & ALLERGIES:    Patient reports the following regarding taking any anticoagulation/antiplatelet therapy:   Plavix, Coumadin, Eliquis, Xarelto, Lovenox, Pradaxa, Brilinta, or Effient? no Aspirin? no  Patient confirms/reports the following medications:  Current Outpatient Medications  Medication Sig Dispense Refill   amoxicillin (AMOXIL) 500 MG capsule Take 1 capsule (500 mg total) by mouth 4 (four) times daily until gone beginning the morning of the surgery. 40 capsule 0   Ascorbic Acid (VITAMIN C PO) Take by mouth daily.     azithromycin (ZITHROMAX Z-PAK) 250 MG tablet Take 2 tablets on day 1 then 1 tablet daily 6 tablet 0   chlorhexidine (PERIDEX) 0.12 % solution Rinse with 15 mLs by Mouth Rinse route 2 (two) times daily and spit out. 473 mL 12   clobetasol (TEMOVATE) 0.05 % external solution Apply once daily to affected areas at bedtime for 3-4 weeks. Then use Monday, Wednesday and Friday. 50 mL 1   clobetasol (TEMOVATE) 0.05 % external solution Apply topically once daily to affected areas at bedtime for 3-4 weeks. Then Monday, Wednesday and Friday. 50 mL 1   Ferrous Sulfate (IRON) 325 (65 Fe) MG TABS Take by mouth.     fluticasone (FLONASE) 50 MCG/ACT nasal spray Instill 2 sprays into each nostril daily. 16 g 11   ipratropium (ATROVENT) 0.06 % nasal spray Place 2 sprays into both nostrils 4 (four)  times daily. 15 mL 12   Lifitegrast (XIIDRA) 5 % SOLN Place 1 drop into both eyes 2 (two) times daily. 180 each 3   meloxicam (MOBIC) 15 MG tablet Take 1 tablet (15 mg total) by mouth daily as needed. 30 tablet 0   mupirocin ointment (BACTROBAN) 2 % Apply topically Three (3) times a day for 7 days. As needed 22 g 0   naproxen (NAPROSYN) 250 MG tablet Take 2 tablets (500 mg total) by mouth morning of procedure and 1 tablet (250 mg total) by mouth three times daily as needed for pain. Do not exceed 5 tablets in a 24 hour period. 16 tablet 0   OIL OF OREGANO PO Take by mouth daily.     predniSONE (STERAPRED UNI-PAK 21 TAB) 10 MG (21) TBPK tablet Take by mouth daily. Take 6 tabs by mouth daily for 1, then 5 tabs for 1 day, then 4 tabs for 1 day, then 3 tabs for 1 day, then 2 tabs for 1 day, then 1 tab for 1 day. 21 tablet 0   tretinoin (RETIN-A) 0.05 % cream Apply 1 Application (pea size amount) topically Nightly. 20 g 3   Vitamin D, Ergocalciferol, (DRISDOL) 1.25 MG (50000 UNIT) CAPS capsule Take 1 capsule (50,000 Units total) by mouth every 7 (seven) days. 12 capsule 0   No current facility-administered medications for this visit.    Patient confirms/reports the following allergies:  Allergies  Allergen Reactions   Sulfa Antibiotics Diarrhea and Nausea And Vomiting  No orders of the defined types were placed in this encounter.   AUTHORIZATION INFORMATION Primary Insurance: 1D#: Group #:  Secondary Insurance: 1D#: Group #:  SCHEDULE INFORMATION: Date: 05/11/2023 Time: Location:  [facility]

## 2023-03-30 NOTE — Patient Instructions (Signed)
Good to see you!  See you again in 8 weeks

## 2023-03-30 NOTE — Telephone Encounter (Signed)
Pt requested call back to schedule 5 year return colonoscopy

## 2023-03-30 NOTE — Telephone Encounter (Signed)
Colonoscopy schedule on 05/11/2023 with Dr Allegra Lai at [facility].

## 2023-03-31 ENCOUNTER — Other Ambulatory Visit: Payer: Self-pay

## 2023-04-01 ENCOUNTER — Other Ambulatory Visit: Payer: Self-pay

## 2023-04-13 ENCOUNTER — Other Ambulatory Visit: Payer: Self-pay

## 2023-04-28 ENCOUNTER — Encounter: Payer: Self-pay | Admitting: Gastroenterology

## 2023-04-28 NOTE — Anesthesia Preprocedure Evaluation (Addendum)
 Anesthesia Evaluation  Patient identified by MRN, date of birth, ID band Patient awake    Reviewed: Allergy & Precautions, H&P , NPO status , Patient's Chart, lab work & pertinent test results  Airway Mallampati: III  TM Distance: >3 FB Neck ROM: Full    Dental no notable dental hx.  Has an implant and a crown in molars, beautiful teeth, did not note any chips:   Pulmonary neg pulmonary ROS   Pulmonary exam normal breath sounds clear to auscultation       Cardiovascular negative cardio ROS Normal cardiovascular exam Rhythm:Regular Rate:Normal     Neuro/Psych  Neuromuscular disease negative neurological ROS  negative psych ROS   GI/Hepatic negative GI ROS, Neg liver ROS,,,  Endo/Other  negative endocrine ROS    Renal/GU negative Renal ROS  negative genitourinary   Musculoskeletal negative musculoskeletal ROS (+) Arthritis ,    Abdominal   Peds negative pediatric ROS (+)  Hematology negative hematology ROS (+) Blood dyscrasia, anemia   Anesthesia Other Findings Anemia Arthritis Wears contact lens   Reproductive/Obstetrics negative OB ROS                             Anesthesia Physical Anesthesia Plan  ASA: 2  Anesthesia Plan:    Post-op Pain Management:    Induction:   PONV Risk Score and Plan:   Airway Management Planned:   Additional Equipment:   Intra-op Plan:   Post-operative Plan:   Informed Consent:   Plan Discussed with:   Anesthesia Plan Comments:        Anesthesia Quick Evaluation

## 2023-05-11 ENCOUNTER — Ambulatory Visit: Payer: Self-pay | Admitting: Anesthesiology

## 2023-05-11 ENCOUNTER — Other Ambulatory Visit: Payer: Self-pay

## 2023-05-11 ENCOUNTER — Encounter: Payer: Self-pay | Admitting: Gastroenterology

## 2023-05-11 ENCOUNTER — Encounter
Admission: RE | Disposition: A | Discharge status: Home / Self Care | Payer: Self-pay | Source: Home / Self Care | Attending: Gastroenterology

## 2023-05-11 ENCOUNTER — Ambulatory Visit
Admission: RE | Admit: 2023-05-11 | Discharge: 2023-05-11 | Disposition: A | Discharge status: Home / Self Care | Payer: Managed Care, Other (non HMO) | Attending: Gastroenterology | Admitting: Gastroenterology

## 2023-05-11 DIAGNOSIS — D123 Benign neoplasm of transverse colon: Secondary | ICD-10-CM | POA: Diagnosis not present

## 2023-05-11 DIAGNOSIS — Z1211 Encounter for screening for malignant neoplasm of colon: Secondary | ICD-10-CM | POA: Insufficient documentation

## 2023-05-11 DIAGNOSIS — Z79899 Other long term (current) drug therapy: Secondary | ICD-10-CM | POA: Diagnosis not present

## 2023-05-11 DIAGNOSIS — Z7952 Long term (current) use of systemic steroids: Secondary | ICD-10-CM | POA: Insufficient documentation

## 2023-05-11 DIAGNOSIS — M1990 Unspecified osteoarthritis, unspecified site: Secondary | ICD-10-CM | POA: Diagnosis not present

## 2023-05-11 DIAGNOSIS — D649 Anemia, unspecified: Secondary | ICD-10-CM | POA: Diagnosis not present

## 2023-05-11 DIAGNOSIS — Z860100 Personal history of colon polyps, unspecified: Secondary | ICD-10-CM

## 2023-05-11 HISTORY — DX: Unspecified osteoarthritis, unspecified site: M19.90

## 2023-05-11 SURGERY — COLONOSCOPY WITH PROPOFOL
Anesthesia: Choice | Site: Rectum

## 2023-05-11 MED ORDER — LACTATED RINGERS IV SOLN: INTRAVENOUS | Status: DC

## 2023-05-11 MED ORDER — GLYCOPYRROLATE 0.2 MG/ML IJ SOLN
INTRAMUSCULAR | Status: AC
  Filled 2023-05-11: qty 1

## 2023-05-11 MED ORDER — STERILE WATER FOR IRRIGATION IR SOLN
Status: DC | PRN
  Administered 2023-05-11: 500 mL

## 2023-05-11 MED ORDER — STERILE WATER FOR IRRIGATION IR SOLN
Status: DC | PRN
  Administered 2023-05-11: 60 mL

## 2023-05-11 MED ORDER — PROPOFOL 10 MG/ML IV BOLUS
INTRAVENOUS | Status: DC | PRN
  Administered 2023-05-11: 30 mg via INTRAVENOUS
  Administered 2023-05-11: 100 mg via INTRAVENOUS
  Administered 2023-05-11 (×4): 30 mg via INTRAVENOUS
  Administered 2023-05-11: 40 mg via INTRAVENOUS
  Administered 2023-05-11: 30 mg via INTRAVENOUS

## 2023-05-11 MED ORDER — SODIUM CHLORIDE 0.9 % IV SOLN: INTRAVENOUS | Status: DC

## 2023-05-11 MED ORDER — PROPOFOL 1000 MG/100ML IV EMUL
INTRAVENOUS | Status: AC
  Filled 2023-05-11: qty 100

## 2023-05-11 MED ORDER — LIDOCAINE HCL (PF) 2 % IJ SOLN
INTRAMUSCULAR | Status: AC
  Filled 2023-05-11: qty 5

## 2023-05-11 SURGICAL SUPPLY — 16 items
CLIP HMST 235XBRD CATH ROT (MISCELLANEOUS) IMPLANT
ELECT REM PT RETURN 9FT ADLT (ELECTROSURGICAL) IMPLANT
ELECTRODE REM PT RTRN 9FT ADLT (ELECTROSURGICAL) IMPLANT
FORCEPS BIOP RAD 4 LRG CAP 4 (CUTTING FORCEPS) IMPLANT
GOWN CVR UNV OPN BCK APRN NK (MISCELLANEOUS) ×2 IMPLANT
INJECTOR VARIJECT VIN23 (MISCELLANEOUS) IMPLANT
KIT DEFENDO VALVE AND CONN (KITS) IMPLANT
KIT PRC NS LF DISP ENDO (KITS) ×1 IMPLANT
MANIFOLD NEPTUNE II (INSTRUMENTS) ×1 IMPLANT
MARKER SPOT ENDO TATTOO 5ML (MISCELLANEOUS) IMPLANT
PROBE APC STR FIRE (PROBE) IMPLANT
RETRIEVER NET ROTH 2.5X230 LF (MISCELLANEOUS) IMPLANT
SNARE COLD EXACTO (MISCELLANEOUS) IMPLANT
TRAP ETRAP POLY (MISCELLANEOUS) IMPLANT
VARIJECT INJECTOR VIN23 (MISCELLANEOUS) IMPLANT
WATER STERILE IRR 250ML POUR (IV SOLUTION) ×1 IMPLANT

## 2023-05-11 NOTE — Transfer of Care (Signed)
 Immediate Anesthesia Transfer of Care Note  Patient: Jenna [name]  Procedure(s) Performed: COLONOSCOPY WITH PROPOFOL (Rectum) POLYPECTOMY (Rectum)  Patient Location: PACU  Anesthesia Type: No value filed.  Level of Consciousness: awake, alert  and patient cooperative  Airway and Oxygen Therapy: Patient Spontanous Breathing and Patient connected to supplemental oxygen  Post-op Assessment: Post-op Vital signs reviewed, Patient's Cardiovascular Status Stable, Respiratory Function Stable, Patent Airway and No signs of Nausea or vomiting  Post-op Vital Signs: Reviewed and stable  Complications: No notable events documented.

## 2023-05-11 NOTE — Anesthesia Postprocedure Evaluation (Signed)
 Anesthesia Post Note  Patient: Jenna [name]  Procedure(s) Performed: COLONOSCOPY WITH PROPOFOL (Rectum) POLYPECTOMY (Rectum)  Patient location during evaluation: PACU Anesthesia Type: General Level of consciousness: awake and alert Pain management: pain level controlled Vital Signs Assessment: post-procedure vital signs reviewed and stable Respiratory status: spontaneous breathing, nonlabored ventilation, respiratory function stable and patient connected to nasal cannula oxygen Cardiovascular status: blood pressure returned to baseline and stable Postop Assessment: no apparent nausea or vomiting Anesthetic complications: no   No notable events documented.   Last Vitals:  Vitals:   05/11/23 0830 05/11/23 0835  BP: 124/81 118/76  Pulse: 79 69  Resp: 14 13  Temp:  (!) 36.2 C  SpO2: 100% 100%    Last Pain:  Vitals:   05/11/23 0835  TempSrc:   PainSc: 0-No pain                 Marisue Humble

## 2023-05-11 NOTE — Op Note (Signed)
 [facility] Gastroenterology Patient Name: Jenna [name] Procedure Date: 05/11/2023 7:26 AM MRN: [id number] Account #: [id number] Date of Birth: 1966/01/06 Admit Type: Outpatient Age: 58 Room: [facility] OR ROOM 01 Gender: Female Note Status: Finalized Instrument Name: [id number] Procedure:             Colonoscopy Indications:           Surveillance: Personal history of adenomatous polyps                         on last colonoscopy 5 years ago, Last colonoscopy:                         March 2020 Providers:             Toney Reil MD, MD Medicines:             General Anesthesia Complications:         No immediate complications. Estimated blood loss: None. Procedure:             Pre-Anesthesia Assessment:                        - Prior to the procedure, a History and Physical was                         performed, and patient medications and allergies were                         reviewed. The patient is competent. The risks and                         benefits of the procedure and the sedation options and                         risks were discussed with the patient. All questions                         were answered and informed consent was obtained.                         Patient identification and proposed procedure were                         verified by the physician, the nurse, the                         anesthesiologist, the anesthetist and the technician                         in the pre-procedure area in the procedure room in the                         endoscopy suite. Mental Status Examination: alert and                         oriented. Airway Examination: normal oropharyngeal                         airway and neck mobility. Respiratory  Examination:                         clear to auscultation. CV Examination: normal.                         Prophylactic Antibiotics: The patient does not require                         prophylactic antibiotics.  Prior Anticoagulants: The                         patient has taken no anticoagulant or antiplatelet                         agents. ASA Grade Assessment: II - A patient with mild                         systemic disease. After reviewing the risks and                         benefits, the patient was deemed in satisfactory                         condition to undergo the procedure. The anesthesia                         plan was to use general anesthesia. Immediately prior                         to administration of medications, the patient was                         re-assessed for adequacy to receive sedatives. The                         heart rate, respiratory rate, oxygen saturations,                         blood pressure, adequacy of pulmonary ventilation, and                         response to care were monitored throughout the                         procedure. The physical status of the patient was                         re-assessed after the procedure.                        After obtaining informed consent, the colonoscope was                         passed under direct vision. Throughout the procedure,                         the patient's blood pressure, pulse, and oxygen  saturations were monitored continuously. The                         Colonoscope was introduced through the anus and                         advanced to the the cecum, identified by appendiceal                         orifice and ileocecal valve. The colonoscopy was                         performed without difficulty. The patient tolerated                         the procedure well. The quality of the bowel                         preparation was evaluated using the BBPS [facility] Bowel                         Preparation Scale) with scores of: Right Colon = 3,                         Transverse Colon = 3 and Left Colon = 3 (entire mucosa                         seen well with no  residual staining, small fragments                         of stool or opaque liquid). The total BBPS score                         equals 9. The ileocecal valve, appendiceal orifice,                         and rectum were photographed. Findings:      The perianal and digital rectal examinations were normal. Pertinent       negatives include normal sphincter tone and no palpable rectal lesions.      A diminutive polyp was found in the transverse colon. The polyp was       sessile. The polyp was removed with a jumbo cold forceps. Resection and       retrieval were complete.      The retroflexed view of the distal rectum and anal verge was normal and       showed no anal or rectal abnormalities.      The exam was otherwise without abnormality. Impression:            - One diminutive polyp in the transverse colon,                         removed with a jumbo cold forceps. Resected and                         retrieved.                        -  The distal rectum and anal verge are normal on                         retroflexion view.                        - The examination was otherwise normal. Recommendation:        - Discharge patient to home (with escort).                        - Resume previous diet today.                        - Continue present medications.                        - Await pathology results.                        - Repeat colonoscopy in 7-10 years for surveillance                         based on pathology results. Procedure Code(s):     --- Professional ---                        [id number], Colonoscopy, flexible; with biopsy, single or                         multiple Diagnosis Code(s):     --- Professional ---                        Z86.010, Personal history of colonic polyps                        D12.3, Benign neoplasm of transverse colon (hepatic                         flexure or splenic flexure) CPT copyright 2022 American Medical Association. All rights  reserved. The codes documented in this report are preliminary and upon coder review may  be revised to meet current compliance requirements. Dr. Libby Maw Toney Reil MD, MD 05/11/2023 8:16:22 AM This report has been signed electronically. Number of Addenda: 0 Note Initiated On: 05/11/2023 7:26 AM Scope Withdrawal Time: 0 hours 8 minutes 38 seconds  Total Procedure Duration: 0 hours 13 minutes 43 seconds  Estimated Blood Loss:  Estimated blood loss: none.      [facility]

## 2023-05-11 NOTE — H&P (Signed)
 Jenna Repress, MD [address]  [address]  [place], [place] [address]  Main: [phone]  Fax: [phone] Pager: [phone]  Primary Care Physician:  [facility] Primary Gastroenterologist:  Dr. Arlyss [name]  Pre-Procedure History & Physical: HPI:  Jenna [name] is a 58 y.o. female is here for an colonoscopy.   Past Medical History:  Diagnosis Date   Anemia    Arthritis    Wears contact lenses     Past Surgical History:  Procedure Laterality Date   CERVICAL CERCLAGE     COLONOSCOPY WITH PROPOFOL N/A 05/10/2018   Procedure: COLONOSCOPY WITH PROPOFOL;  Surgeon: Pasty Spillers, MD;  Location: [facility];  Service: Endoscopy;  Laterality: N/A;   POLYPECTOMY  05/10/2018   Procedure: POLYPECTOMY;  Surgeon: Pasty Spillers, MD;  Location: [facility];  Service: Endoscopy;;    Prior to Admission medications   Medication Sig Start Date End Date Taking? Authorizing Provider  Ascorbic Acid (VITAMIN C PO) Take by mouth daily.   Yes [provider]  meloxicam (MOBIC) 15 MG tablet Take 1 tablet (15 mg total) by mouth daily as needed. 03/30/23 03/29/24 Yes Judi Saa, DO  tretinoin (RETIN-A) 0.05 % cream Apply 1 Application (pea size amount) topically Nightly. 04/03/22  Yes   Vitamin D, Ergocalciferol, (DRISDOL) 1.25 MG (50000 UNIT) CAPS capsule Take 1 capsule (50,000 Units total) by mouth every 7 (seven) days. 03/30/23 03/29/24 Yes Judi Saa, DO  zinc gluconate 50 MG tablet Take 50 mg by mouth daily.   Yes [provider]  amoxicillin (AMOXIL) 500 MG capsule Take 1 capsule (500 mg total) by mouth 4 (four) times daily until gone beginning the morning of the surgery. Patient not taking: Reported on 04/28/2023 10/07/22     azithromycin (ZITHROMAX Z-PAK) 250 MG tablet Take 2 tablets on day 1 then 1 tablet daily Patient not taking: Reported on 04/28/2023 01/15/23   Katha Cabal, DO  chlorhexidine (PERIDEX) 0.12 %  solution Rinse with 15 mLs by Mouth Rinse route 2 (two) times daily and spit out. Patient not taking: Reported on 04/28/2023 10/07/22     clobetasol (TEMOVATE) 0.05 % external solution Apply once daily to affected areas at bedtime for 3-4 weeks. Then use Monday, Wednesday and Friday. 01/29/22     clobetasol (TEMOVATE) 0.05 % external solution Apply topically once daily to affected areas at bedtime for 3-4 weeks. Then Monday, Wednesday and Friday. 07/23/22     Ferrous Sulfate (IRON) 325 (65 Fe) MG TABS Take by mouth.    [provider]  fluticasone (FLONASE) 50 MCG/ACT nasal spray Instill 2 sprays into each nostril daily. Patient not taking: Reported on 04/28/2023 02/17/22     ipratropium (ATROVENT) 0.06 % nasal spray Place 2 sprays into both nostrils 4 (four) times daily. 01/15/23   Brimage, Seward Meth, DO  Lifitegrast (XIIDRA) 5 % SOLN Place 1 drop into both eyes 2 (two) times daily. 10/28/22     mupirocin ointment (BACTROBAN) 2 % Apply topically Three (3) times a day for 7 days. As needed 11/11/21     naproxen (NAPROSYN) 250 MG tablet Take 2 tablets (500 mg total) by mouth morning of procedure and 1 tablet (250 mg total) by mouth three times daily as needed for pain. Do not exceed 5 tablets in a 24 hour period. Patient not taking: Reported on 04/28/2023 10/07/22     OIL OF OREGANO PO Take by mouth daily.    [provider]  predniSONE (STERAPRED UNI-PAK 21 TAB) 10 MG (21) TBPK tablet Take by mouth daily. Take 6 tabs by mouth daily for 1, then 5 tabs for 1 day, then 4 tabs for 1 day, then 3 tabs for 1 day, then 2 tabs for 1 day, then 1 tab for 1 day. Patient not taking: Reported on 04/28/2023 01/15/23   Katha Cabal, DO    Allergies as of 03/30/2023 - Review Complete 03/30/2023  Allergen Reaction Noted   Sulfa antibiotics Diarrhea and Nausea And Vomiting 10/01/2015    Family History  Problem Relation Age of Onset   Diabetes Other    Diabetes Father    Kidney disease Father    Heart  attack Father     Social History   Socioeconomic History   Marital status: Single    Spouse name: Not on file   Number of children: Not on file   Years of education: Not on file   Highest education level: Not on file  Occupational History   Not on file  Tobacco Use   Smoking status: Never   Smokeless tobacco: Never  Vaping Use   Vaping status: Never Used  Substance and Sexual Activity   Alcohol use: Yes    Comment: socially - couple x/year   Drug use: Never   Sexual activity: Not on file  Other Topics Concern   Not on file  Social History Narrative   Not on file   Social Drivers of Health   Financial Resource Strain: Not on file  Food Insecurity: Not on file  Transportation Needs: Not on file  Physical Activity: Not on file  Stress: Not on file  Social Connections: Not on file  Intimate Partner Violence: Not on file    Review of Systems: See HPI, otherwise negative ROS  Physical Exam: BP 127/82   Pulse 74   Temp (!) 97.2 F (36.2 C) (Temporal)   Resp 17   Ht 5\' 8"  (1.727 m)   Wt 72.6 kg   LMP  (LMP Unknown) Comment: IUD, denies preg  SpO2 100%   BMI 24.33 kg/m  General:   Alert,  pleasant and cooperative in NAD Head:  Normocephalic and atraumatic. Neck:  Supple; no masses or thyromegaly. Lungs:  Clear throughout to auscultation.    Heart:  Regular rate and rhythm. Abdomen:  Soft, nontender and nondistended. Normal bowel sounds, without guarding, and without rebound.   Neurologic:  Alert and  oriented x4;  grossly normal neurologically.  Impression/Plan: Jenna [name] is here for an colonoscopy to be performed for h/o colon adenoma  Risks, benefits, limitations, and alternatives regarding  colonoscopy have been reviewed with the patient.  Questions have been answered.  All parties agreeable.   Lannette Donath, MD  05/11/2023, 7:44 AM

## 2023-05-12 ENCOUNTER — Encounter: Payer: Self-pay | Admitting: Gastroenterology

## 2023-05-12 LAB — SURGICAL PATHOLOGY

## 2023-05-19 ENCOUNTER — Ambulatory Visit
Admission: EM | Admit: 2023-05-19 | Discharge: 2023-05-19 | Disposition: A | Discharge status: Home / Self Care | Attending: Family Medicine | Admitting: Family Medicine

## 2023-05-19 DIAGNOSIS — U071 COVID-19: Secondary | ICD-10-CM | POA: Insufficient documentation

## 2023-05-19 LAB — RESP PANEL BY RT-PCR (FLU A&B, COVID) ARPGX2
Influenza A by PCR: NEGATIVE
Influenza B by PCR: NEGATIVE
SARS Coronavirus 2 by RT PCR: POSITIVE — AB

## 2023-05-19 MED ORDER — PROMETHAZINE-DM 6.25-15 MG/5ML PO SYRP: 5.0000 mL | ORAL_SOLUTION | Freq: Four times a day (QID) | ORAL | 0 refills | Status: AC | PRN

## 2023-05-19 NOTE — ED Provider Notes (Signed)
 [facility]    CSN: [id number] Arrival date & time: 05/19/23  0854      History   Chief Complaint Chief Complaint  Patient presents with   Generalized Body Aches   Cough    HPI Jenna [name] is a 58 y.o. female.   HPI  History obtained from the patient. Jenna [name] presents for 1 day of scratchy throat, body aches, chills, dry but now productive.Denies rhinorrhea and anesal congestion. Drank some ginger tea. Took Aleve and AlkaSeltzer with some relief.     Past Medical History:  Diagnosis Date   Anemia    Arthritis    Wears contact lenses     Patient Active Problem List   Diagnosis Date Noted   Pain in right acromioclavicular joint 03/24/2022   Right shoulder pain 03/24/2022   TFCC (triangular fibrocartilage complex) injury, left, initial encounter 09/29/2021   Lumbar trigger point syndrome 01/29/2021   SI (sacroiliac) joint dysfunction 07/21/2018   Nonallopathic lesion of sacral region 07/21/2018   Nonallopathic lesion of lumbosacral region 07/21/2018   Special screening for malignant neoplasms, colon    Benign neoplasm of transverse colon    Nonallopathic lesion of cervical region 10/20/2017   Nonallopathic lesion of thoracic region 10/20/2017   Nonallopathic lesion of rib cage 10/20/2017   Left rotator cuff tear 09/22/2017   Glenoid labral tear, right, initial encounter 09/22/2017   DDD (degenerative disc disease), cervical 09/22/2017    Past Surgical History:  Procedure Laterality Date   CERVICAL CERCLAGE     COLONOSCOPY WITH PROPOFOL N/A 05/10/2018   Procedure: COLONOSCOPY WITH PROPOFOL;  Surgeon: Pasty Spillers, MD;  Location: [facility];  Service: Endoscopy;  Laterality: N/A;   COLONOSCOPY WITH PROPOFOL N/A 05/11/2023   Procedure: COLONOSCOPY WITH PROPOFOL;  Surgeon: Toney Reil, MD;  Location: [facility];  Service: Endoscopy;  Laterality: N/A;   POLYPECTOMY  05/10/2018   Procedure: POLYPECTOMY;  Surgeon:  Pasty Spillers, MD;  Location: [facility];  Service: Endoscopy;;   POLYPECTOMY N/A 05/11/2023   Procedure: POLYPECTOMY;  Surgeon: Toney Reil, MD;  Location: [facility];  Service: Endoscopy;  Laterality: N/A;    OB History   No obstetric history on file.      Home Medications    Prior to Admission medications   Medication Sig Start Date End Date Taking? Authorizing Provider  Ascorbic Acid (VITAMIN C PO) Take by mouth daily.   Yes [provider]  clobetasol (TEMOVATE) 0.05 % external solution Apply topically once daily to affected areas at bedtime for 3-4 weeks. Then Monday, Wednesday and Friday. 07/23/22  Yes   Ferrous Sulfate (IRON) 325 (65 Fe) MG TABS Take by mouth.   Yes [provider]  ipratropium (ATROVENT) 0.06 % nasal spray Place 2 sprays into both nostrils 4 (four) times daily. 01/15/23  Yes Seraphina Mitchner, DO  Lifitegrast (XIIDRA) 5 % SOLN Place 1 drop into both eyes 2 (two) times daily. 10/28/22  Yes   meloxicam (MOBIC) 15 MG tablet Take 1 tablet (15 mg total) by mouth daily as needed. 03/30/23 03/29/24 Yes Antoine Primas M, DO  OIL OF OREGANO PO Take by mouth daily.   Yes [provider]  promethazine-dextromethorphan (PROMETHAZINE-DM) 6.25-15 MG/5ML syrup Take 5 mLs by mouth 4 (four) times daily as needed. 05/19/23  Yes Kahlel Peake, DO  tretinoin (RETIN-A) 0.05 % cream Apply 1 Application (pea size amount) topically Nightly. 04/03/22  Yes   Vitamin D, Ergocalciferol, (DRISDOL) 1.25  MG (50000 UNIT) CAPS capsule Take 1 capsule (50,000 Units total) by mouth every 7 (seven) days. 03/30/23 03/29/24 Yes Judi Saa, DO  zinc gluconate 50 MG tablet Take 50 mg by mouth daily.   Yes [provider]  clobetasol (TEMOVATE) 0.05 % external solution Apply once daily to affected areas at bedtime for 3-4 weeks. Then use Monday, Wednesday and Friday. 01/29/22     mupirocin ointment (BACTROBAN) 2 % Apply topically Three (3)  times a day for 7 days. As needed 11/11/21       Family History Family History  Problem Relation Age of Onset   Diabetes Other    Diabetes Father    Kidney disease Father    Heart attack Father     Social History Social History   Tobacco Use   Smoking status: Never   Smokeless tobacco: Never  Vaping Use   Vaping status: Never Used  Substance Use Topics   Alcohol use: Yes    Comment: socially - couple x/year   Drug use: Never     Allergies   Sulfa antibiotics   Review of Systems Review of Systems: negative unless otherwise stated in HPI.      Physical Exam Triage Vital Signs ED Triage Vitals  Encounter Vitals Group     BP 05/19/23 1000 120/77     Systolic BP Percentile --      Diastolic BP Percentile --      Pulse Rate 05/19/23 1000 88     Resp 05/19/23 1000 16     Temp 05/19/23 1000 98.7 F (37.1 C)     Temp Source 05/19/23 1000 Oral     SpO2 05/19/23 1000 95 %     Weight --      Height --      Head Circumference --      Peak Flow --      Pain Score 05/19/23 0956 5     Pain Loc --      Pain Education --      Exclude from Growth Chart --    No data found.  Updated Vital Signs BP 120/77 (BP Location: Left Arm)   Pulse 88   Temp 98.7 F (37.1 C) (Oral)   Resp 16   LMP  (LMP Unknown) Comment: IUD, denies preg  SpO2 95%   Visual Acuity Right Eye Distance:   Left Eye Distance:   Bilateral Distance:    Right Eye Near:   Left Eye Near:    Bilateral Near:     Physical Exam GEN:     alert, non-toxic appearing female in no distress    HENT:  mucus membranes moist, oropharyngeal without lesions or erythema, no tonsillar hypertrophy or exudates, no visible nasal discharge EYES:   no scleral injection or discharge NECK:  normal ROM, no meningismus   RESP:  no increased work of breathing, clear to auscultation bilaterally CVS:   regular rate and rhythm Skin:   warm and dry    UC Treatments / Results  Labs (all labs ordered are listed, but  only abnormal results are displayed) Labs Reviewed  RESP PANEL BY RT-PCR (FLU A&B, COVID) ARPGX2 - Abnormal; Notable for the following components:      Result Value   SARS Coronavirus 2 by RT PCR POSITIVE (*)    All other components within normal limits    EKG   Radiology No results found.  Procedures Procedures (including critical care time)  Medications Ordered in UC  Medications - No data to display  Initial Impression / Assessment and Plan / UC Course  I have reviewed the triage vital signs and the nursing notes.  Pertinent labs & imaging results that were available during my care of the patient were reviewed by me and considered in my medical decision making (see chart for details).       Pt is a 58 y.o. female who presents for 1 day of respiratory symptoms. Ellanor is afebrile here. Satting well on room air. Overall pt is non-toxic appearing, well hydrated, without respiratory distress. Pulmonary exam is unremarkable.  COVID and influenza panel obtained and was COVID positive. After shared decision making, Paxlovid was declined. Promethazine DM for cough and continue Atrovent nasal spray for congestion.  Discussed symptomatic treatment.  Typical duration of symptoms discussed.   Return and ED precautions given and voiced understanding. Discussed MDM, treatment plan and plan for follow-up with patient who agrees with plan.     Final Clinical Impressions(s) / UC Diagnoses   Final diagnoses:  COVID-19     Discharge Instructions      Your flu test is negative however your test for COVID-19 was positive.  The new CDC recommendations suggest returning to normal activities when, for at least 24 hours, symptoms are improving overall, and if a fever was present, it has been gone without use of a fever-reducing medication.  You should wear a mask for the next 5 days to prevent the spread of disease. Please continue good preventive care measures, including:  frequent  hand-washing, avoid touching your face, cover coughs/sneezes, stay out of crowds and keep a 6 foot distance from others.  Go to the nearest hospital emergency room if fever/cough/breathlessness are severe or illness seems like a threat to life.  If your were prescribed medication. Stop by the pharmacy to pick it up. You can take Tylenol and/or Ibuprofen as needed for fever reduction and pain relief.    For cough: honey 1/2 to 1 teaspoon (you can dilute the honey in water or another fluid).  I sent a prescription cough medication to your pharmacy. You can use a humidifier for chest congestion and cough.  If you don't have a humidifier, you can sit in the bathroom with the hot shower running.      For sore throat: try warm salt water gargles, Mucinex sore throat cough drops or cepacol lozenges, throat spray, warm tea or water with lemon/honey, popsicles or ice, or OTC cold relief medicine for throat discomfort. You can also purchase chloraseptic spray at the pharmacy or dollar store.   For congestion: take a daily anti-histamine like Zyrtec, Claritin, and a oral decongestant, such as pseudoephedrine. Continue using Atrovent nasal spray.    It is important to stay hydrated: drink plenty of fluids (water, gatorade/powerade/pedialyte, juices, or teas) to keep your throat moisturized and help further relieve irritation/discomfort.    Return or go to the Emergency Department if symptoms worsen or do not improve in the next few days      ED Prescriptions     Medication Sig Dispense Auth. Provider   promethazine-dextromethorphan (PROMETHAZINE-DM) 6.25-15 MG/5ML syrup Take 5 mLs by mouth 4 (four) times daily as needed. 118 mL Katha Cabal, DO      PDMP not reviewed this encounter.   Katha Cabal, DO 05/19/23 1102

## 2023-05-19 NOTE — Discharge Instructions (Addendum)
 Your flu test is negative however your test for COVID-19 was positive.  The new CDC recommendations suggest returning to normal activities when, for at least 24 hours, symptoms are improving overall, and if a fever was present, it has been gone without use of a fever-reducing medication.  You should wear a mask for the next 5 days to prevent the spread of disease. Please continue good preventive care measures, including:  frequent hand-washing, avoid touching your face, cover coughs/sneezes, stay out of crowds and keep a 6 foot distance from others.  Go to the nearest hospital emergency room if fever/cough/breathlessness are severe or illness seems like a threat to life.  If your were prescribed medication. Stop by the pharmacy to pick it up. You can take Tylenol and/or Ibuprofen as needed for fever reduction and pain relief.    For cough: honey 1/2 to 1 teaspoon (you can dilute the honey in water or another fluid).  I sent a prescription cough medication to your pharmacy. You can use a humidifier for chest congestion and cough.  If you don't have a humidifier, you can sit in the bathroom with the hot shower running.      For sore throat: try warm salt water gargles, Mucinex sore throat cough drops or cepacol lozenges, throat spray, warm tea or water with lemon/honey, popsicles or ice, or OTC cold relief medicine for throat discomfort. You can also purchase chloraseptic spray at the pharmacy or dollar store.   For congestion: take a daily anti-histamine like Zyrtec, Claritin, and a oral decongestant, such as pseudoephedrine. Continue using Atrovent nasal spray.    It is important to stay hydrated: drink plenty of fluids (water, gatorade/powerade/pedialyte, juices, or teas) to keep your throat moisturized and help further relieve irritation/discomfort.    Return or go to the Emergency Department if symptoms worsen or do not improve in the next few days

## 2023-05-19 NOTE — ED Triage Notes (Signed)
 Body aches, fatigue, scratchy throat, cough x 1 day. Taking alka-seltzer cold and aleve.

## 2023-05-26 ENCOUNTER — Ambulatory Visit: Payer: Managed Care, Other (non HMO) | Admitting: Family Medicine

## 2023-05-27 NOTE — Progress Notes (Signed)
 Jenna [name] [address] [place] [address] Phone: [phone] Subjective:   Jenna [name], am serving as a scribe for Dr. Antoine Primas.  I'm seeing this patient by the request  of:  [facility]  CC: Back and neck pain follow-up  [id number]  Jenna [name] is a 58 y.o. female coming in with complaint of back and neck pain. OMT 03/30/2023. Patient states continues to have some discomfort and pain.  This seems to be more in the neck than the lower back today.  States that in certain times when she is sitting for a long duration causes some more discomfort.  Has been worse since she did have the COVID 2 weeks ago.  Medications patient has been prescribed: Meloxicam, Vit D  Taking:         Reviewed prior external information including notes and imaging from previsou exam, outside providers and external EMR if available.   As well as notes that were available from care everywhere and other healthcare systems.  Past medical history, social, surgical and family history all reviewed in electronic medical record.  No pertanent information unless stated regarding to the chief complaint.   Past Medical History:  Diagnosis Date   Anemia    Arthritis    Wears contact lenses     Allergies  Allergen Reactions   Sulfa Antibiotics Diarrhea and Nausea And Vomiting     Review of Systems:  No headache, visual changes, nausea, vomiting, diarrhea, constipation, dizziness, abdominal pain, skin rash, fevers, chills, night sweats, weight loss, swollen lymph nodes, body aches, joint swelling, chest pain, shortness of breath, mood changes. POSITIVE muscle aches  Objective  Blood pressure 118/72, pulse 85, height 5\' 8"  (1.727 m), weight 160 lb (72.6 kg), SpO2 95%.   General: No apparent distress alert and oriented x3 mood and affect normal, dressed appropriately.  HEENT: Pupils equal, extraocular movements intact  Respiratory:  Patient's speak in full sentences and does not appear short of breath  Cardiovascular: No lower extremity edema, non tender, no erythema  Gait MSK:  Back does have some loss lordosis noted.  Some tenderness to palpation in the paraspinal musculature.  Tightness with Pearlean Brownie right greater than left.  Osteopathic findings  C3 flexed rotated and side bent right T3 extended rotated and side bent right inhaled rib T9 extended rotated and side bent left L3 flexed rotated and side bent right Sacrum right on right       Assessment and Plan:  DDD (degenerative disc disease), cervical Degenerative disc disease noted.  Has responded well to osteopathic manipulation previously.  Discussed that Zanaflex 4 mg to take at night.  I have tried multiple other medications on a fairly regular basis.  Discussed ergonomics that could be beneficial.  Discussed icing regimen.  Follow-up again in 6 to 8 weeks otherwise.    Nonallopathic problems  Decision today to treat with OMT was based on Physical Exam  After verbal consent patient was treated with HVLA, ME, FPR techniques in cervical, rib, thoracic, lumbar, and sacral  areas  Patient tolerated the procedure well with improvement in symptoms  Patient given exercises, stretches and lifestyle modifications  See medications in patient instructions if given  Patient will follow up in 4-8 weeks    The above documentation has been reviewed and is accurate and complete Judi Saa, DO          Note: This dictation was prepared with Dragon dictation along  with smaller phrase technology. Any transcriptional errors that result from this process are unintentional.

## 2023-06-01 ENCOUNTER — Ambulatory Visit: Admitting: Family Medicine

## 2023-06-01 ENCOUNTER — Encounter: Payer: Self-pay | Admitting: Family Medicine

## 2023-06-01 VITALS — BP 118/72 | HR 85 | Ht 68.0 in | Wt 160.0 lb

## 2023-06-01 DIAGNOSIS — M9908 Segmental and somatic dysfunction of rib cage: Secondary | ICD-10-CM | POA: Diagnosis not present

## 2023-06-01 DIAGNOSIS — M5030 Other cervical disc degeneration, unspecified cervical region: Secondary | ICD-10-CM

## 2023-06-01 DIAGNOSIS — M9902 Segmental and somatic dysfunction of thoracic region: Secondary | ICD-10-CM

## 2023-06-01 DIAGNOSIS — M9901 Segmental and somatic dysfunction of cervical region: Secondary | ICD-10-CM

## 2023-06-01 DIAGNOSIS — M9903 Segmental and somatic dysfunction of lumbar region: Secondary | ICD-10-CM

## 2023-06-01 DIAGNOSIS — M9904 Segmental and somatic dysfunction of sacral region: Secondary | ICD-10-CM

## 2023-06-01 NOTE — Patient Instructions (Signed)
 Good news is you have options See you again in 2-3 months Good to see you!

## 2023-06-01 NOTE — Assessment & Plan Note (Signed)
 Degenerative disc disease noted.  Has responded well to osteopathic manipulation previously.  Discussed that Zanaflex 4 mg to take at night.  I have tried multiple other medications on a fairly regular basis.  Discussed ergonomics that could be beneficial.  Discussed icing regimen.  Follow-up again in 6 to 8 weeks otherwise.

## 2023-07-07 ENCOUNTER — Other Ambulatory Visit: Payer: Self-pay

## 2023-07-26 ENCOUNTER — Other Ambulatory Visit: Payer: Self-pay | Admitting: Family Medicine

## 2023-07-29 NOTE — Progress Notes (Deleted)
  [facility] [address] [place] [address] Phone: [phone] Subjective:    I'm seeing this patient by the request  of:  [facility]  CC:   [id number]  OCTAVIA VELADOR is a 58 y.o. female coming in with complaint of back and neck pain. OMT 06/01/2023. Patient states   Medications patient has been prescribed: Meloxicam , Vit D  Taking:         Reviewed prior external information including notes and imaging from previsou exam, outside providers and external EMR if available.   As well as notes that were available from care everywhere and other healthcare systems.  Past medical history, social, surgical and family history all reviewed in electronic medical record.  No pertanent information unless stated regarding to the chief complaint.   Past Medical History:  Diagnosis Date   Anemia    Arthritis    Wears contact lenses     Allergies  Allergen Reactions   Sulfa Antibiotics Diarrhea and Nausea And Vomiting     Review of Systems:  No headache, visual changes, nausea, vomiting, diarrhea, constipation, dizziness, abdominal pain, skin rash, fevers, chills, night sweats, weight loss, swollen lymph nodes, body aches, joint swelling, chest pain, shortness of breath, mood changes. POSITIVE muscle aches  Objective  There were no vitals taken for this visit.   General: No apparent distress alert and oriented x3 mood and affect normal, dressed appropriately.  HEENT: Pupils equal, extraocular movements intact  Respiratory: Patient's speak in full sentences and does not appear short of breath  Cardiovascular: No lower extremity edema, non tender, no erythema  Gait MSK:  Back   Osteopathic findings  C2 flexed rotated and side bent right C6 flexed rotated and side bent left T3 extended rotated and side bent right inhaled rib T9 extended rotated and side bent left L2 flexed rotated and side bent right Sacrum right on  right       Assessment and Plan:  No problem-specific Assessment & Plan notes found for this encounter.    Nonallopathic problems  Decision today to treat with OMT was based on Physical Exam  After verbal consent patient was treated with HVLA, ME, FPR techniques in cervical, rib, thoracic, lumbar, and sacral  areas  Patient tolerated the procedure well with improvement in symptoms  Patient given exercises, stretches and lifestyle modifications  See medications in patient instructions if given  Patient will follow up in 4-8 weeks             Note: This dictation was prepared with Dragon dictation along with smaller phrase technology. Any transcriptional errors that result from this process are unintentional.

## 2023-08-02 ENCOUNTER — Ambulatory Visit: Admitting: Family Medicine

## 2023-08-03 NOTE — Progress Notes (Signed)
 [facility] [address] [place] [address] Phone: [phone] Subjective:   Jenna [name], am serving as a scribe for Dr. Ronnell Coins.  I'm seeing this patient by the request  of:  [facility]  CC: back and neck pain follow up   [id number]  Jenna [name] is a 58 y.o. female coming in with complaint of back and neck pain. OMT 06/01/2023. Patient states that her iron is low and she feels it in her lower back. Had labs on Friday.   Medications patient has been prescribed: Meloxicam , Vit D  Taking:         Reviewed prior external information including notes and imaging from previsou exam, outside providers and external EMR if available.   As well as notes that were available from care everywhere and other healthcare systems.  Past medical history, social, surgical and family history all reviewed in electronic medical record.  No pertanent information unless stated regarding to the chief complaint.   Past Medical History:  Diagnosis Date   Anemia    Arthritis    Wears contact lenses     Allergies  Allergen Reactions   Sulfa Antibiotics Diarrhea and Nausea And Vomiting     Review of Systems:  No headache, visual changes, nausea, vomiting, diarrhea, constipation, dizziness, abdominal pain, skin rash, fevers, chills, night sweats, weight loss, swollen lymph nodes, body aches, joint swelling, chest pain, shortness of breath, mood changes. POSITIVE muscle aches  Objective  Blood pressure 122/78, pulse 89, height 5\' 8"  (1.727 m), weight 159 lb (72.1 kg), SpO2 98%.   General: No apparent distress alert and oriented x3 mood and affect normal, dressed appropriately.  HEENT: Pupils equal, extraocular movements intact  Respiratory: Patient's speak in full sentences and does not appear short of breath  Cardiovascular: No lower extremity edema, non tender, no erythema  Gait relatively normal  MSK:  Back severe  tightness noted more in the paraspinal musculature of the lumbar spine on the left side but also has significant tightness noted on the right side of the sacroiliac joint.  Osteopathic findings  C3 flexed rotated and side bent right C6 flexed rotated and side bent left T3 extended rotated and side bent right inhaled rib T6 extended rotated and side bent left L1 flexed rotated and side bent right L3 flexed rotated and side bent left Sacrum right on right     Assessment and Plan:  DDD (degenerative disc disease), cervical Arthritic changes still noted.  Patient has been holding her 6-week grandchild much more frequently.  Do think this is caused some exacerbation Not been sleeping lately.  Still not doing medications on a regular basis.  Follow-up again in 6 to 8 weeks otherwise.  SI (sacroiliac) joint dysfunction Chronic problem with exacerbation.  Worsening pain secondary to traveling more in a car.  Discussed seated position.  No new medications noted but encouraged her to take it on a regular basis including the muscle relaxer at night if needed.  Patient will consider it but usually does not on a regular basis.    Nonallopathic problems  Decision today to treat with OMT was based on Physical Exam  After verbal consent patient was treated with HVLA, ME, FPR techniques in cervical, rib, thoracic, lumbar, and sacral  areas  Patient tolerated the procedure well with improvement in symptoms  Patient given exercises, stretches and lifestyle modifications  See medications in patient instructions if given  Patient will follow  up in 4-8 weeks    The above documentation has been reviewed and is accurate and complete Jenna Margo, DO          Note: This dictation was prepared with Dragon dictation along with smaller phrase technology. Any transcriptional errors that result from this process are unintentional.

## 2023-08-10 ENCOUNTER — Ambulatory Visit: Admitting: Family Medicine

## 2023-08-10 ENCOUNTER — Encounter: Payer: Self-pay | Admitting: Family Medicine

## 2023-08-10 VITALS — BP 122/78 | HR 89 | Ht 68.0 in | Wt 159.0 lb

## 2023-08-10 DIAGNOSIS — M9903 Segmental and somatic dysfunction of lumbar region: Secondary | ICD-10-CM | POA: Diagnosis not present

## 2023-08-10 DIAGNOSIS — M9904 Segmental and somatic dysfunction of sacral region: Secondary | ICD-10-CM | POA: Diagnosis not present

## 2023-08-10 DIAGNOSIS — M9902 Segmental and somatic dysfunction of thoracic region: Secondary | ICD-10-CM

## 2023-08-10 DIAGNOSIS — M9901 Segmental and somatic dysfunction of cervical region: Secondary | ICD-10-CM

## 2023-08-10 DIAGNOSIS — M533 Sacrococcygeal disorders, not elsewhere classified: Secondary | ICD-10-CM

## 2023-08-10 DIAGNOSIS — M5030 Other cervical disc degeneration, unspecified cervical region: Secondary | ICD-10-CM | POA: Diagnosis not present

## 2023-08-10 DIAGNOSIS — M9908 Segmental and somatic dysfunction of rib cage: Secondary | ICD-10-CM | POA: Diagnosis not present

## 2023-08-10 NOTE — Patient Instructions (Signed)
 Careful on driving position Congrats on little one See me in 6-8 weeks

## 2023-08-10 NOTE — Assessment & Plan Note (Signed)
 Chronic problem with exacerbation.  Worsening pain secondary to traveling more in a car.  Discussed seated position.  No new medications noted but encouraged her to take it on a regular basis including the muscle relaxer at night if needed.  Patient will consider it but usually does not on a regular basis.

## 2023-08-10 NOTE — Assessment & Plan Note (Signed)
 Arthritic changes still noted.  Patient has been holding her 6-week grandchild much more frequently.  Do think this is caused some exacerbation Not been sleeping lately.  Still not doing medications on a regular basis.  Follow-up again in 6 to 8 weeks otherwise.

## 2023-08-19 ENCOUNTER — Ambulatory Visit: Admitting: Dermatology

## 2023-08-19 ENCOUNTER — Encounter: Payer: Self-pay | Admitting: Dermatology

## 2023-08-19 VITALS — BP 135/92 | HR 71

## 2023-08-19 DIAGNOSIS — L810 Postinflammatory hyperpigmentation: Secondary | ICD-10-CM

## 2023-08-19 DIAGNOSIS — L7211 Pilar cyst: Secondary | ICD-10-CM

## 2023-08-19 DIAGNOSIS — L6681 Central centrifugal cicatricial alopecia: Secondary | ICD-10-CM | POA: Diagnosis not present

## 2023-08-19 MED ORDER — SAFETY SEAL MISCELLANEOUS MISC: 1.0000 | Freq: Every morning | 11 refills | Status: DC

## 2023-08-19 MED ORDER — SAFETY SEAL MISCELLANEOUS MISC: 1.0000 | Freq: Every evening | 6 refills | Status: DC

## 2023-08-19 NOTE — Progress Notes (Signed)
 New Patient Visit   Subjective  Jenna [name] is a 58 y.o. female who presents for the following: Hyperpigmentation and pilar cyst  Patient states she has hyperpigmentation and pilar cyst located at the scalp and chest that she would like to have examined. Patient reports the cyst has been there for about 3-6 months & the hyperpigmentation 6-9 months after using SA facial wash. She reports the areas are bothersome.Patient rates irritation 2 out of 10. Patient reports she has previously been treated for these areas. Her pcp prescribed TMC 0.1% ointment to apply to the area on her chest that she is currently applying. She has also previously tried Hydroquinone off and on for a few months but it cause irritation so she discontinued usage.   The following portions of the chart were reviewed this encounter and updated as appropriate: medications, allergies, medical history  Review of Systems:  No other skin or systemic complaints except as noted in HPI or Assessment and Plan.  Objective  Well appearing patient in no apparent distress; mood and affect are within normal limits.  A focused examination was performed of the following areas: Face, Chest and Scalp  Relevant exam findings are noted in the Assessment and Plan.                 Assessment & Plan   POST-INFLAMMATORY HYPERPIGMENTATION (PIH) Exam: hyperpigmented macules and/or patches at face and chest    Patient Education Discussed During Visit:  This is a benign condition that comes from having previous inflammation in the skin and will fade with time over months to sometimes years. Recommend daily sun protection including sunscreen SPF 30+ to sun-exposed areas. - Recommend treating any itchy or red areas on the skin quickly to prevent new areas of PIH. Treating with prescription medicines such as hydroquinone may help fade dark spots faster.   - Assessment:  Patient presents with discoloration of the cheeks that started  approximately 9 months ago after using salicylic face wash, consistent with post-inflammatory hyperpigmentation. Additionally, patchy darker spots around the mouth are noted, indicative of melasma. These conditions are affecting the patient's facial appearance and require treatment.  - Plan:    Prescribe combination lightening cream from [facility] compounding pharmacy     - Contains transaminic acid     - Apply morning and night    Recommend over-the-counter lightening cream with thiametol     - Apply in the morning after prescription cream    Provide sunscreen samples     - Recommend sunscreen with thiametol    Continue Retin-A  at night (current strength unknown)    Patient education:     - Educate on proper application order: face wash, prescription cream, thiametol, sunscreen     - Inform patient that improvement may take up to 4 months    Follow up in 4 months   Pilar Cyst Exam: 1.5cm mobile Subcutaneous nodule Located at the vertex scalp  Benign-appearing. Exam most consistent with a pilar cyst. Discussed that a cyst is a benign growth that can grow over time and sometimes get irritated or inflamed. Recommend observation if it is not bothersome. Discussed option of surgical excision to remove it if it is growing, symptomatic, or other changes noted. Please call for new or changing lesions so they can be evaluated.  Central centrifugal cicatricial alopecia (CCCA)  Exam: Scarring alopecia characterized by patches of permanent hair loss that manifest on the vertex or crown of the scalp, progressively spreading outward  in a centrifugal pattern  - Assessment: Patient reports hair loss after braiding in January, consistent with traction alopecia. Examination reveals evidence of scarring alopecia. This condition requires prompt treatment to prevent further permanent hair loss.  - Plan:    Prescribe compound solution from [facility]     - Contains clobetasol  and minoxidil     - Apply in  the morning    Continue use of Rosemary and Peppermint oil   CENTRAL CENTRIFUGAL CICATRICIAL ALOPECIA   Related Medications Safety Seal Miscellaneous MISC Apply 1 Application topically in the morning. Medication Name: AA Gel (Minoxidil 10% Clobetasol  0.05%) POSTINFLAMMATORY HYPERPIGMENTATION   Related Medications Safety Seal Miscellaneous MISC Apply 1 Application topically at bedtime. Medication Name: Melaxemic Cream (Tranexamic Acid 5%, Kojic Acid 2%, Vit C 2.5%, Hyaluronic Acid 0.1%)  Return in about 4 months (around 12/19/2023) for PIH and Hair loss F/U.  I, [name], am acting as Neurosurgeon for [employer], DO.  Documentation: I have reviewed the above documentation for accuracy and completeness, and I agree with the above.  Louana Roup, DO

## 2023-08-19 NOTE — Patient Instructions (Addendum)
 Date: Thu Aug 19 2023  Hello Jenna [name],  Thank you for visiting today. Here is a summary of the key instructions:  - Medications:   - Use prescription lightening cream morning and night   - Apply over-the-counter lightening cream with thiametol in the morning   - Continue using Retin-A  at night   - Apply compound scalp solution with clobetasol  and minoxidil in the morning  - Skin Care:   - Wash face in the morning   - Apply prescription compound cream, then Eucerin Radiant Tone thiamidol, then sunscreen   - Use sunscreen daily   - Continue using Rosemary and Peppermint oil for scalp  - Follow-up: Return for follow-up appointment in 4 months  - Pharmacy Instructions:   - Pick up prescription cream from [facility] compounding pharmacy   - Pick up scalp solution from [facility]  We look forward to seeing you at your next visit. If you have any questions or concerns before then, please do not hesitate to contact our office.  Warm regards,  Dr. Louana Roup D[facility]at[facility]            Recommended Sunscreen: Elner Hahn ([email or web address])    Important Information   Due to recent changes in healthcare laws, you may see results of your pathology and/or laboratory studies on MyChart before the doctors have had a chance to review them. We understand that in some cases there may be results that are confusing or concerning to you. Please understand that not all results are received at the same time and often the doctors may need to interpret multiple results in order to provide you with the best plan of care or course of treatment. Therefore, we ask that you please give us  2 business days to thoroughly review all your results before contacting the office for clarification. Should we see a critical lab result, you will be contacted sooner.     If You Need Anything After Your Visit   If you have any questions or concerns for your doctor, please call our main line at [phone]. If no  one answers, please leave a voicemail as directed and we will return your call as soon as possible. Messages left after 4 pm will be answered the following business day.    You may also send us  a message via MyChart. We typically respond to MyChart messages within 1-2 business days.  For prescription refills, please ask your pharmacy to contact our office. Our fax number is [phone].  If you have an urgent issue when the clinic is closed that cannot wait until the next business day, you can page your doctor at the number below.     Please note that while we do our best to be available for urgent issues outside of office hours, we are not available 24/7.    If you have an urgent issue and are unable to reach us , you may choose to seek medical care at your doctor's office, retail clinic, urgent care center, or emergency room.   If you have a medical emergency, please immediately call 911 or go to the emergency department. In the event of inclement weather, please call our main line at [phone] for an update on the status of any delays or closures.  Dermatology Medication Tips: Please keep the boxes that topical medications come in in order to help keep track of the instructions about where and how to use these. Pharmacies typically print the medication instructions only on the boxes and not directly on  the medication tubes.   If your medication is too expensive, please contact our office at [phone] or send us  a message through MyChart.    We are unable to tell what your co-pay for medications will be in advance as this is different depending on your insurance coverage. However, we may be able to find a substitute medication at lower cost or fill out paperwork to get insurance to cover a needed medication.    If a prior authorization is required to get your medication covered by your insurance company, please allow us  1-2 business days to complete this process.   Drug prices often  vary depending on where the prescription is filled and some pharmacies may offer cheaper prices.   The website www.goodrx.com contains coupons for medications through different pharmacies. The prices here do not account for what the cost may be with help from insurance (it may be cheaper with your insurance), but the website can give you the price if you did not use any insurance.  - You can print the associated coupon and take it with your prescription to the pharmacy.  - You may also stop by our office during regular business hours and pick up a GoodRx coupon card.  - If you need your prescription sent electronically to a different pharmacy, notify our office through [facility] or by phone at [phone]

## 2023-09-13 ENCOUNTER — Encounter: Payer: Self-pay | Admitting: Dermatology

## 2023-09-29 NOTE — Progress Notes (Deleted)
  [name] [address] [place] [address] Phone: [phone] Subjective:    I'm seeing this patient by the request  of:  [facility]  CC:   YEP:Dlagzrupcz  Jenna [name] is a 58 y.o. female coming in with complaint of back and neck pain. OMT 08/10/2023. Patient states   Medications patient has been prescribed: Vit D  Taking:         Reviewed prior external information including notes and imaging from previsou exam, outside providers and external EMR if available.   As well as notes that were available from care everywhere and other healthcare systems.  Past medical history, social, surgical and family history all reviewed in electronic medical record.  No pertanent information unless stated regarding to the chief complaint.   Past Medical History:  Diagnosis Date   Anemia    Arthritis    Wears contact lenses     Allergies  Allergen Reactions   Sulfa Antibiotics Diarrhea and Nausea And Vomiting     Review of Systems:  No headache, visual changes, nausea, vomiting, diarrhea, constipation, dizziness, abdominal pain, skin rash, fevers, chills, night sweats, weight loss, swollen lymph nodes, body aches, joint swelling, chest pain, shortness of breath, mood changes. POSITIVE muscle aches  Objective  There were no vitals taken for this visit.   General: No apparent distress alert and oriented x3 mood and affect normal, dressed appropriately.  HEENT: Pupils equal, extraocular movements intact  Respiratory: Patient's speak in full sentences and does not appear short of breath  Cardiovascular: No lower extremity edema, non tender, no erythema  Gait MSK:  Back   Osteopathic findings  C2 flexed rotated and side bent right C6 flexed rotated and side bent left T3 extended rotated and side bent right inhaled rib T9 extended rotated and side bent left L2 flexed rotated and side bent right Sacrum right on  right       Assessment and Plan:  No problem-specific Assessment & Plan notes found for this encounter.    Nonallopathic problems  Decision today to treat with OMT was based on Physical Exam  After verbal consent patient was treated with HVLA, ME, FPR techniques in cervical, rib, thoracic, lumbar, and sacral  areas  Patient tolerated the procedure well with improvement in symptoms  Patient given exercises, stretches and lifestyle modifications  See medications in patient instructions if given  Patient will follow up in 4-8 weeks             Note: This dictation was prepared with Dragon dictation along with smaller phrase technology. Any transcriptional errors that result from this process are unintentional.

## 2023-09-30 ENCOUNTER — Ambulatory Visit: Admitting: Family Medicine

## 2023-10-06 ENCOUNTER — Other Ambulatory Visit: Payer: Self-pay

## 2023-10-08 ENCOUNTER — Other Ambulatory Visit: Payer: Self-pay

## 2023-10-18 ENCOUNTER — Other Ambulatory Visit: Payer: Self-pay

## 2023-12-16 ENCOUNTER — Other Ambulatory Visit: Payer: Self-pay | Admitting: Family Medicine

## 2023-12-27 ENCOUNTER — Other Ambulatory Visit: Payer: Self-pay

## 2023-12-27 MED ORDER — FLUZONE 0.5 ML IM SUSY
0.5000 mL | PREFILLED_SYRINGE | Freq: Once | INTRAMUSCULAR | 0 refills | Status: AC
  Filled 2023-12-27: qty 0.5, 1d supply, fill #0

## 2024-01-06 ENCOUNTER — Encounter: Payer: Self-pay | Admitting: Dermatology

## 2024-01-06 ENCOUNTER — Ambulatory Visit: Admitting: Dermatology

## 2024-01-06 DIAGNOSIS — L819 Disorder of pigmentation, unspecified: Secondary | ICD-10-CM

## 2024-01-06 DIAGNOSIS — L669 Cicatricial alopecia, unspecified: Secondary | ICD-10-CM | POA: Diagnosis not present

## 2024-01-06 DIAGNOSIS — L810 Postinflammatory hyperpigmentation: Secondary | ICD-10-CM

## 2024-01-06 DIAGNOSIS — L6681 Central centrifugal cicatricial alopecia: Secondary | ICD-10-CM

## 2024-01-06 DIAGNOSIS — L7211 Pilar cyst: Secondary | ICD-10-CM

## 2024-01-06 MED ORDER — SAFETY SEAL MISCELLANEOUS MISC: 1.0000 | 11 refills | Status: DC

## 2024-01-06 MED ORDER — TRETINOIN 0.05 % EX CREA: 1.0000 | TOPICAL_CREAM | Freq: Every evening | CUTANEOUS | 4 refills | Status: AC

## 2024-01-06 NOTE — Progress Notes (Signed)
   Follow-Up Visit   Subjective  Jenna [name] is a 58 y.o. female who presents for the following: PIH and Hair Loss  Patient (and/or pt guardian) consented to the use of AI-assisted tools for note generation.   Jenna [name] is here for a follow up on PIH on the face and chest. Her last visit was on 08/19/23. She was prescribed lightening cream containing transaminic acid to apply morning and night. It was recommended to get OTC lightening cream containing thiametol cream to apply in the morning after prescription cream and continue Retin-A  at night. She is currently following this regimen and is very happy with her results so far.   CCCA was also addressed at this visit. She was prescribed clobetasol  and minoxidil compound sent to [facility]. She was to use this in the morning and continue use of Rosemary and Peppermint oil. She is following this regimen and happy with results so far.   The following portions of the chart were reviewed this encounter and updated as appropriate: medications, allergies, medical history  Review of Systems:  No other skin or systemic complaints except as noted in HPI or Assessment and Plan.  Objective  Well appearing patient in no apparent distress; mood and affect are within normal limits.  A focused examination was performed of the following areas: Scalp and Face  Relevant exam findings are noted in the Assessment and Plan.    Assessment & Plan    Cicatricial alopecia (scarring alopecia) of scalp Cicatricial alopecia with scarring on the scalp. Improvement with baby hairs in previously bald areas. Current treatment includes clobetasol  and minoxidil. Addition of finasteride to enhance hair growth. Hair is not too tight, and sides are starting to regrow. Minoxidil may cause yellowing of hair due to residue buildup. - Continue clobetasol  and minoxidil compound with Added finasteride to the treatment regimen. - Advised co-washing with warm water  every two weeks  to prevent residue buildup from topical compound.  Pilar cyst of scalp, vertex Pilar cyst on the vertex of the scalp, slightly raised and inflamed. Previous removal by Erminio, but cyst has recurred. Surgical removal is the only definitive treatment. Surgery may take 3-4 months due to prioritization of skin cancer cases. - Scheduled appointment with Dr. Paci for surgical removal of the pilar cyst.  Facial hyperpigmentation Managed with Retin-A  and Eucerin Radiantone. Retin-A  used three nights a week, with potential reduction to two nights during colder months due to skin dryness. Eucerin Radiantone used morning and night. New nighttime moisturizer recommended to improve skin tolerance to Retin-A . Discussed layering of skincare products to prevent peeling and irritation. - Continue Retin-A  three nights a week, adjust to two nights during colder months if skin becomes dry. - Continue Eucerin Radiantone morning and night. - Introduced Avene single fate moisturizer at night, especially on non-Retin-A  nights. - Refilled tretinoin  prescription. - Consider Centella sunscreen for daily use.   Return in about 1 year (around 01/05/2025) for CCCA, PIH in one year, Dr. Paci schedule for pilar cyst removal.  I, Gordan Beams, CMA, am acting as scribe for [employer], DO.   Documentation: I have reviewed the above documentation for accuracy and completeness, and I agree with the above.  Delon Lenis, DO

## 2024-01-06 NOTE — Patient Instructions (Addendum)

## 2024-01-07 ENCOUNTER — Encounter: Payer: Self-pay | Admitting: Dermatology

## 2024-01-10 ENCOUNTER — Ambulatory Visit: Admitting: Dermatology

## 2024-01-10 NOTE — Telephone Encounter (Signed)
 Yes, we can send rx for minoxidil and finasteride to [facility] and a separate rx of clobetasol  to her regular pharmacy.

## 2024-01-11 ENCOUNTER — Other Ambulatory Visit: Payer: Self-pay | Admitting: Family Medicine

## 2024-01-11 ENCOUNTER — Other Ambulatory Visit: Payer: Self-pay

## 2024-01-11 DIAGNOSIS — L6681 Central centrifugal cicatricial alopecia: Secondary | ICD-10-CM

## 2024-01-11 DIAGNOSIS — L219 Seborrheic dermatitis, unspecified: Secondary | ICD-10-CM

## 2024-01-11 MED ORDER — SAFETY SEAL MISCELLANEOUS MISC: 1.0000 | 11 refills | Status: AC

## 2024-01-11 MED ORDER — CLOBETASOL PROPIONATE 0.05 % EX SOLN: CUTANEOUS | 6 refills | Status: AC

## 2024-01-18 ENCOUNTER — Other Ambulatory Visit: Payer: Self-pay

## 2024-02-03 NOTE — Progress Notes (Deleted)
  [name] [address] [place] [address] Phone: [phone] Subjective:    I'm seeing this patient by the request  of:  [facility]  CC:   YEP:Dlagzrupcz  Jenna [name] is a 58 y.o. female coming in with complaint of back and neck pain. OMT 08/10/2023. Patient states   Medications patient has been prescribed: Meloxicam   Taking:         Reviewed prior external information including notes and imaging from previsou exam, outside providers and external EMR if available.   As well as notes that were available from care everywhere and other healthcare systems.  Past medical history, social, surgical and family history all reviewed in electronic medical record.  No pertanent information unless stated regarding to the chief complaint.   Past Medical History:  Diagnosis Date   Anemia    Arthritis    Wears contact lenses     Allergies  Allergen Reactions   Sulfa Antibiotics Diarrhea and Nausea And Vomiting     Review of Systems:  No headache, visual changes, nausea, vomiting, diarrhea, constipation, dizziness, abdominal pain, skin rash, fevers, chills, night sweats, weight loss, swollen lymph nodes, body aches, joint swelling, chest pain, shortness of breath, mood changes. POSITIVE muscle aches  Objective  There were no vitals taken for this visit.   General: No apparent distress alert and oriented x3 mood and affect normal, dressed appropriately.  HEENT: Pupils equal, extraocular movements intact  Respiratory: Patient's speak in full sentences and does not appear short of breath  Cardiovascular: No lower extremity edema, non tender, no erythema  Gait MSK:  Back   Osteopathic findings  C2 flexed rotated and side bent right C6 flexed rotated and side bent left T3 extended rotated and side bent right inhaled rib T9 extended rotated and side bent left L2 flexed rotated and side bent right Sacrum right on  right       Assessment and Plan:  No problem-specific Assessment & Plan notes found for this encounter.    Nonallopathic problems  Decision today to treat with OMT was based on Physical Exam  After verbal consent patient was treated with HVLA, ME, FPR techniques in cervical, rib, thoracic, lumbar, and sacral  areas  Patient tolerated the procedure well with improvement in symptoms  Patient given exercises, stretches and lifestyle modifications  See medications in patient instructions if given  Patient will follow up in 4-8 weeks             Note: This dictation was prepared with Dragon dictation along with smaller phrase technology. Any transcriptional errors that result from this process are unintentional.

## 2024-02-09 ENCOUNTER — Ambulatory Visit: Admitting: Family Medicine

## 2024-03-08 ENCOUNTER — Ambulatory Visit: Admitting: Family Medicine

## 2024-03-14 ENCOUNTER — Ambulatory Visit: Admitting: Dermatology

## 2024-03-28 ENCOUNTER — Other Ambulatory Visit: Payer: Self-pay

## 2024-03-28 DIAGNOSIS — L810 Postinflammatory hyperpigmentation: Secondary | ICD-10-CM

## 2024-03-28 MED ORDER — SAFETY SEAL MISCELLANEOUS MISC: 1.0000 | Freq: Every evening | 6 refills | Status: AC

## 2024-03-31 ENCOUNTER — Ambulatory Visit: Admitting: Dermatology

## 2024-03-31 ENCOUNTER — Encounter: Payer: Self-pay | Admitting: Dermatology

## 2024-03-31 VITALS — BP 141/85 | HR 79

## 2024-03-31 DIAGNOSIS — D492 Neoplasm of unspecified behavior of bone, soft tissue, and skin: Secondary | ICD-10-CM | POA: Diagnosis not present

## 2024-03-31 DIAGNOSIS — L720 Epidermal cyst: Secondary | ICD-10-CM | POA: Diagnosis not present

## 2024-03-31 NOTE — Progress Notes (Signed)
 "  Follow-Up Visit   Subjective  Jenna [name] is a 59 y.o. female who presents for the following: Excision of Neoplasm of Skin on the scalp, referred by Dr. Alm.   The following portions of the chart were reviewed this encounter and updated as appropriate: medications, allergies, medical history  Review of Systems:  No other skin or systemic complaints except as noted in HPI or Assessment and Plan.  Objective  Well appearing patient in no apparent distress; mood and affect are within normal limits.  A focused examination was performed of the following areas: Scalp Relevant physical exam findings are noted in the Assessment and Plan.   3.0 Vicryl and staples Mid Parietal Scalp Subcutaneous nodule   Assessment & Plan   NEOPLASM OF SKIN Mid Parietal Scalp - Skin excision  Excision method:  elliptical Lesion length (cm):  2.2 Lesion width (cm):  2 Margin per side (cm):  0.1 Total excision diameter (cm):  2.4 Informed consent: discussed and consent obtained   Timeout: patient name, date of birth, surgical site, and procedure verified   Procedure prep:  Patient was prepped and draped in usual sterile fashion Prep type:  Chlorhexidine  Anesthesia: the lesion was anesthetized in a standard fashion   Anesthetic:  1% lidocaine  w/ epinephrine 1-100,000 buffered w/ 8.4% NaHCO3 Instrument used: #15 blade   Hemostasis achieved with: suture, pressure and electrodesiccation   Outcome: patient tolerated procedure well with no complications   Post-procedure details: sterile dressing applied and wound care instructions given    - Skin repair Complexity:  Complex Final length (cm):  3.5 Informed consent: discussed and consent obtained   Timeout: patient name, date of birth, surgical site, and procedure verified   Procedure prep:  Patient was prepped and draped in usual sterile fashion Prep type:  Chlorhexidine  Anesthesia: the lesion was anesthetized in a standard fashion    Anesthetic:  1% lidocaine  w/ epinephrine 1-100,000 buffered w/ 8.4% NaHCO3 Reason for type of repair: reduce tension to allow closure, allow closure of the large defect and preserve normal anatomy   Undermining: area extensively undermined   Subcutaneous layers (deep stitches):  Suture size:  3-0 Suture type: Vicryl (polyglactin 910)   Stitches:  Buried vertical mattress Fine/surface layer approximation (top stitches):  Fine approximation suture size: Staples. Suture removal (days):  10 Hemostasis achieved with: suture, pressure and electrodesiccation Outcome: patient tolerated procedure well with no complications   Post-procedure details: sterile dressing applied and wound care instructions given   Dressing type: pressure dressing and bandage    Specimen 1 - Surgical pathology Differential Diagnosis: Pilar Cyst   Check Margins: No  The surgical wound was then cleaned, prepped, and re-anesthetized as above. Wound edges were undermined extensively along at least one entire edge and at a distance equal to or greater than the width of the defect (see wound defect size above) in order to achieve closure and decrease wound tension and anatomic distortion. Redundant tissue repair including standing cone removal was performed. Hemostasis was achieved with electrocautery. Subcutaneous and epidermal tissues were approximated with the above sutures. The surgical site was then lightly scrubbed with sterile, saline-soaked gauze. The area was then bandaged using Vaseline ointment, non-adherent gauze, gauze pads, and tape to provide an adequate pressure dressing. The patient tolerated the procedure well, was given detailed written and verbal wound care instructions, and was discharged in good condition.   The patient will follow-up: 10 days for staple removal.  Return in about 10 days (around 04/10/2024)  for Staple Removal.  I, Jenna [name], am acting as scribe for RUFUS CHRISTELLA HOLY, MD.  Documentation: I  have reviewed the above documentation for accuracy and completeness, and I agree with the above.  RUFUS CHRISTELLA HOLY, MD  "

## 2024-03-31 NOTE — Patient Instructions (Signed)

## 2024-04-03 ENCOUNTER — Encounter: Admitting: Dermatology

## 2024-04-04 ENCOUNTER — Ambulatory Visit: Payer: Self-pay | Admitting: Dermatology

## 2024-04-04 LAB — SURGICAL PATHOLOGY

## 2024-04-10 ENCOUNTER — Ambulatory Visit: Admitting: Dermatology

## 2024-04-10 ENCOUNTER — Ambulatory Visit: Admitting: Family Medicine

## 2024-04-13 ENCOUNTER — Ambulatory Visit: Admitting: Family Medicine

## 2025-01-08 ENCOUNTER — Ambulatory Visit: Admitting: Dermatology
